# Patient Record
Sex: Female | Born: 1985 | Race: Black or African American | Hispanic: No | Marital: Single | Smoking: Never smoker
Health system: Southern US, Community
[De-identification: ages and names within clinical notes are randomized; demographics above are authoritative.]

## PROBLEM LIST (undated history)

## (undated) ENCOUNTER — Inpatient Hospital Stay (HOSPITAL_COMMUNITY): Payer: Self-pay

## (undated) ENCOUNTER — Emergency Department (HOSPITAL_COMMUNITY): Admission: EM | Payer: Medicaid Other | Source: Home / Self Care

## (undated) DIAGNOSIS — Z8619 Personal history of other infectious and parasitic diseases: Secondary | ICD-10-CM

## (undated) DIAGNOSIS — E119 Type 2 diabetes mellitus without complications: Secondary | ICD-10-CM

## (undated) DIAGNOSIS — D573 Sickle-cell trait: Secondary | ICD-10-CM

## (undated) DIAGNOSIS — I10 Essential (primary) hypertension: Secondary | ICD-10-CM

## (undated) HISTORY — DX: Sickle-cell trait: D57.3

## (undated) HISTORY — PX: NO PAST SURGERIES: SHX2092

## (undated) HISTORY — DX: Essential (primary) hypertension: I10

## (undated) HISTORY — DX: Personal history of other infectious and parasitic diseases: Z86.19

## (undated) HISTORY — DX: Type 2 diabetes mellitus without complications: E11.9

---
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
## 2005-02-28 ENCOUNTER — Emergency Department (HOSPITAL_COMMUNITY): Admission: EM | Admit: 2005-02-28 | Discharge: 2005-02-28 | Payer: Self-pay | Admitting: Family Medicine

## 2005-04-20 ENCOUNTER — Ambulatory Visit: Payer: Self-pay | Admitting: Family Medicine

## 2006-08-31 ENCOUNTER — Inpatient Hospital Stay (HOSPITAL_COMMUNITY): Admission: AD | Admit: 2006-08-31 | Discharge: 2006-09-02 | Payer: Self-pay | Admitting: Obstetrics

## 2007-01-20 ENCOUNTER — Ambulatory Visit: Payer: Self-pay | Admitting: Family Medicine

## 2007-01-20 ENCOUNTER — Telehealth: Payer: Self-pay | Admitting: *Deleted

## 2007-06-18 ENCOUNTER — Encounter: Admission: RE | Admit: 2007-06-18 | Discharge: 2007-06-18 | Payer: Self-pay | Admitting: Occupational Medicine

## 2008-01-13 ENCOUNTER — Emergency Department (HOSPITAL_COMMUNITY): Admission: EM | Admit: 2008-01-13 | Discharge: 2008-01-13 | Payer: Self-pay | Admitting: Family Medicine

## 2008-02-03 ENCOUNTER — Emergency Department (HOSPITAL_COMMUNITY): Admission: EM | Admit: 2008-02-03 | Discharge: 2008-02-03 | Payer: Self-pay | Admitting: Family Medicine

## 2008-03-26 ENCOUNTER — Emergency Department (HOSPITAL_COMMUNITY): Admission: EM | Admit: 2008-03-26 | Discharge: 2008-03-26 | Payer: Self-pay | Admitting: Family Medicine

## 2008-06-30 ENCOUNTER — Emergency Department (HOSPITAL_COMMUNITY): Admission: EM | Admit: 2008-06-30 | Discharge: 2008-06-30 | Payer: Self-pay | Admitting: Family Medicine

## 2008-07-19 ENCOUNTER — Emergency Department (HOSPITAL_COMMUNITY): Admission: EM | Admit: 2008-07-19 | Discharge: 2008-07-19 | Payer: Self-pay | Admitting: Family Medicine

## 2008-08-20 ENCOUNTER — Emergency Department (HOSPITAL_COMMUNITY): Admission: EM | Admit: 2008-08-20 | Discharge: 2008-08-20 | Payer: Self-pay | Admitting: Emergency Medicine

## 2008-08-23 ENCOUNTER — Emergency Department (HOSPITAL_COMMUNITY): Admission: EM | Admit: 2008-08-23 | Discharge: 2008-08-23 | Payer: Self-pay | Admitting: Emergency Medicine

## 2008-09-12 ENCOUNTER — Emergency Department (HOSPITAL_COMMUNITY): Admission: EM | Admit: 2008-09-12 | Discharge: 2008-09-12 | Payer: Self-pay | Admitting: Emergency Medicine

## 2009-01-08 ENCOUNTER — Emergency Department (HOSPITAL_COMMUNITY): Admission: EM | Admit: 2009-01-08 | Discharge: 2009-01-08 | Payer: Self-pay | Admitting: Family Medicine

## 2009-04-20 ENCOUNTER — Emergency Department (HOSPITAL_COMMUNITY): Admission: EM | Admit: 2009-04-20 | Discharge: 2009-04-20 | Payer: Self-pay | Admitting: Family Medicine

## 2009-10-21 ENCOUNTER — Emergency Department (HOSPITAL_COMMUNITY): Admission: EM | Admit: 2009-10-21 | Discharge: 2009-10-21 | Payer: Self-pay | Admitting: Emergency Medicine

## 2010-04-11 ENCOUNTER — Emergency Department (HOSPITAL_COMMUNITY): Admission: EM | Admit: 2010-04-11 | Discharge: 2010-04-11 | Payer: Self-pay | Admitting: Family Medicine

## 2010-07-05 ENCOUNTER — Emergency Department (HOSPITAL_COMMUNITY): Admission: EM | Admit: 2010-07-05 | Discharge: 2010-07-05 | Payer: Self-pay | Admitting: Family Medicine

## 2010-08-28 ENCOUNTER — Emergency Department (HOSPITAL_COMMUNITY)
Admission: EM | Admit: 2010-08-28 | Discharge: 2010-08-28 | Payer: Self-pay | Source: Home / Self Care | Admitting: Family Medicine

## 2010-12-10 ENCOUNTER — Emergency Department (HOSPITAL_COMMUNITY)
Admission: EM | Admit: 2010-12-10 | Discharge: 2010-12-10 | Disposition: A | Discharge status: Home / Self Care | Payer: No Typology Code available for payment source | Attending: Emergency Medicine | Admitting: Emergency Medicine

## 2010-12-10 DIAGNOSIS — M545 Low back pain, unspecified: Secondary | ICD-10-CM | POA: Insufficient documentation

## 2010-12-10 DIAGNOSIS — S335XXA Sprain of ligaments of lumbar spine, initial encounter: Secondary | ICD-10-CM | POA: Insufficient documentation

## 2010-12-10 DIAGNOSIS — Y929 Unspecified place or not applicable: Secondary | ICD-10-CM | POA: Insufficient documentation

## 2010-12-10 DIAGNOSIS — J45909 Unspecified asthma, uncomplicated: Secondary | ICD-10-CM | POA: Insufficient documentation

## 2010-12-10 DIAGNOSIS — D573 Sickle-cell trait: Secondary | ICD-10-CM | POA: Insufficient documentation

## 2010-12-17 LAB — URINALYSIS, ROUTINE W REFLEX MICROSCOPIC
Bilirubin Urine: NEGATIVE
Hgb urine dipstick: NEGATIVE
Nitrite: NEGATIVE
Specific Gravity, Urine: 1.014 (ref 1.005–1.030)
pH: 5.5 (ref 5.0–8.0)

## 2010-12-17 LAB — CBC
HCT: 36.9 % (ref 36.0–46.0)
Hemoglobin: 13 g/dL (ref 12.0–15.0)
RBC: 4.22 MIL/uL (ref 3.87–5.11)
WBC: 6.1 10*3/uL (ref 4.0–10.5)

## 2010-12-17 LAB — BASIC METABOLIC PANEL
BUN: 3 mg/dL — ABNORMAL LOW (ref 6–23)
CO2: 22 mEq/L (ref 19–32)
Calcium: 9 mg/dL (ref 8.4–10.5)
Chloride: 100 mEq/L (ref 96–112)
Creatinine, Ser: 0.77 mg/dL (ref 0.4–1.2)
GFR calc Af Amer: >60 mL/min (ref >60)
GFR calc non Af Amer: >60 mL/min (ref >60)
Glucose, Bld: 102 mg/dL — ABNORMAL HIGH (ref 70–99)
Potassium: 3.6 mEq/L (ref 3.5–5.1)
Sodium: 127 mEq/L — ABNORMAL LOW (ref 135–145)

## 2011-03-21 ENCOUNTER — Emergency Department (HOSPITAL_COMMUNITY)
Admission: EM | Admit: 2011-03-21 | Discharge: 2011-03-21 | Disposition: A | Discharge status: Home / Self Care | Payer: Medicaid Other | Attending: Emergency Medicine | Admitting: Emergency Medicine

## 2011-03-21 DIAGNOSIS — D573 Sickle-cell trait: Secondary | ICD-10-CM | POA: Insufficient documentation

## 2011-03-21 DIAGNOSIS — J3489 Other specified disorders of nose and nasal sinuses: Secondary | ICD-10-CM | POA: Insufficient documentation

## 2011-03-21 DIAGNOSIS — R059 Cough, unspecified: Secondary | ICD-10-CM | POA: Insufficient documentation

## 2011-03-21 DIAGNOSIS — J45901 Unspecified asthma with (acute) exacerbation: Secondary | ICD-10-CM | POA: Insufficient documentation

## 2011-03-21 DIAGNOSIS — R05 Cough: Secondary | ICD-10-CM | POA: Insufficient documentation

## 2011-07-03 LAB — CULTURE, ROUTINE-ABSCESS

## 2011-09-29 ENCOUNTER — Ambulatory Visit: Payer: Worker's Compensation

## 2012-02-27 ENCOUNTER — Encounter (HOSPITAL_COMMUNITY): Payer: Self-pay | Admitting: *Deleted

## 2012-02-27 ENCOUNTER — Emergency Department (HOSPITAL_COMMUNITY): Payer: Self-pay

## 2012-02-27 ENCOUNTER — Emergency Department (HOSPITAL_COMMUNITY)
Admission: EM | Admit: 2012-02-27 | Discharge: 2012-02-27 | Disposition: A | Discharge status: Home / Self Care | Payer: Self-pay | Attending: Emergency Medicine | Admitting: Emergency Medicine

## 2012-02-27 DIAGNOSIS — IMO0001 Reserved for inherently not codable concepts without codable children: Secondary | ICD-10-CM | POA: Insufficient documentation

## 2012-02-27 DIAGNOSIS — M25519 Pain in unspecified shoulder: Secondary | ICD-10-CM | POA: Insufficient documentation

## 2012-02-27 MED ORDER — NAPROXEN 500 MG PO TABS: 500.0000 mg | ORAL_TABLET | Freq: Two times a day (BID) | ORAL | Status: DC

## 2012-02-27 MED ORDER — KETOROLAC TROMETHAMINE 60 MG/2ML IM SOLN
60.0000 mg | Freq: Once | INTRAMUSCULAR | Status: AC
  Administered 2012-02-27: 60 mg via INTRAMUSCULAR
  Filled 2012-02-27: qty 2

## 2012-02-27 NOTE — ED Provider Notes (Signed)
History     CSN: [id number]  Arrival date & time 02/27/12  1111   First MD Initiated Contact with Patient 02/27/12 1138      Chief Complaint  Patient presents with  . Shoulder Pain    (Consider location/radiation/quality/duration/timing/severity/associated sxs/prior treatment) HPI Comments: Patient with a significant medical history presents emergency department chief complaint of left shoulder pain.  Acute onset of gradually worsening pain about a week long period patient states she's been working more than usual for multiple days and wrote doing a lot of over arms motions and lifting heavy frozen boxes.  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  Patient is right arm dominant.  Patient is a 26 y.o. female presenting with shoulder pain. The history is provided by the patient.  Shoulder Pain This is a new problem. The current episode started in the past 7 days. The problem occurs constantly. The problem has been gradually worsening. Associated symptoms include arthralgias and myalgias. Pertinent negatives include no abdominal pain, anorexia, change in bowel habit, chest pain, chills, congestion, coughing, diaphoresis, fatigue, fever, headaches, joint swelling, nausea, neck pain, numbness, rash, sore throat, swollen glands, urinary symptoms, vertigo, visual change, vomiting or weakness. The symptoms are aggravated by nothing. She has tried nothing for the symptoms.    History reviewed. No pertinent past medical history.  History reviewed. No pertinent past surgical history.  History reviewed. No pertinent family history.  History  Substance Use Topics  . Smoking status: Not on file  . Smokeless tobacco: Not on file  . Alcohol Use: Not on file    OB History    Grav Para Term Preterm Abortions TAB SAB Ect Mult Living                  Review of Systems  Constitutional: Negative for fever, chills, diaphoresis and fatigue.  HENT:  Negative for congestion, sore throat and neck pain.   Respiratory: Negative for cough.   Cardiovascular: Negative for chest pain.  Gastrointestinal: Negative for nausea, vomiting, abdominal pain, anorexia and change in bowel habit.  Musculoskeletal: Positive for myalgias and arthralgias. Negative for back pain, joint swelling and gait problem.  Skin: Negative for rash.  Neurological: Negative for vertigo, weakness, numbness and headaches.  All other systems reviewed and are negative.    Allergies  Review of patient's allergies indicates no known allergies.  Home Medications   Current Outpatient Rx  Name Route Sig Dispense Refill  . ALBUTEROL SULFATE HFA 108 (90 BASE) MCG/ACT IN AERS Inhalation Inhale 2 puffs into the lungs every 6 (six) hours as needed. For wheezing    . IBUPROFEN 200 MG PO TABS Oral Take 600 mg by mouth every 6 (six) hours as needed. For pain      BP 120/69  Pulse 78  Temp(Src) 98.3 F (36.8 C) (Oral)  Resp 14  SpO2 99%  LMP 02/14/2012  Physical Exam  Nursing note and vitals reviewed. Constitutional: She is oriented to person, place, and time. She appears well-developed and well-nourished. No distress.  HENT:  Head: Normocephalic and atraumatic.  Eyes: Conjunctivae and EOM are normal.  Neck: Normal range of motion.  Pulmonary/Chest: Effort normal.  Musculoskeletal: Normal range of motion.       Tenderness to palpation over left supraspinatus and infraspinatus muscles.  Painful ROM above 90 abduction, negative Neer sign, no pain with internal rotation.  Strength 5 out of 5 bilaterally, however induces pain.  Intact  distal pulses and sensation.  Neurological: She is alert and oriented to person, place, and time.  Skin: Skin is warm and dry. No rash noted. She is not diaphoretic.  Psychiatric: She has a normal mood and affect. Her behavior is normal.    ED Course  Procedures (including critical care time)  Labs Reviewed - No data to display Dg Shoulder  Left  02/27/2012  *RADIOLOGY REPORT*  Clinical Data: Left shoulder pain.  LEFT SHOULDER - 2+ VIEW  Comparison: None.  Findings: No fracture or dislocation. Small inferior acromial spur at the acromioclavicular joint.  Visualized portion of the left chest is unremarkable.  IMPRESSION: No acute findings.  Original Report Authenticated By: Reyes Ivan, M.D.     No diagnosis found.    MDM  Left shoulder pain ? Shoulder impingement syndrom vs rotator cuff tendinopathy  Images reviewed and discussed with patient.  Patient treated in the emergency department with Toradol.  She'll be discharged with instructions for cryotherapy, rest, and Naprosyn.  Patient will be given a work no with instructions for  restrictions.  Patient advised to followup with orthopedics if symptoms persist.  Recommended physical therapy and stretching as well. Patient verbalizes understanding.  Jaci Carrel, [place] 02/27/12 1340

## 2012-02-27 NOTE — Discharge Instructions (Signed)
Read instructions below to learn more about your possible diagnosis & for reasons to return to the ED. I recommend ice treatment and physical therapy.   Is there anything I can do on my own to feel better? -- Yes. Different exercises can help your shoulder get better. To keep your shoulder from getting too stiff, you can do an exercise called the pendulum stretch. To do this exercise, let your arm relax and hang down while you sit or stand. Move your arm back and forth, then side to side, and then around in small circles. Try to do this exercise for 5 minutes, 1 or 2 times a day.  Other types of exercises can help make the shoulder muscles stronger.  When you do shoulder exercises, it's important to: Warm up your shoulder first by taking a hot shower or bath.  Start slowly and make the exercises harder over time.  Know that some soreness is normal. If you have sharp or tearing pain, stop what you're doing and let your doctor or nurse know.

## 2012-02-27 NOTE — ED Notes (Signed)
Discharged home with written and verbal instruction.  Work note given.  No questions or concerns at discharge.

## 2012-02-27 NOTE — ED Notes (Signed)
To ED for eval of left shoulder pain and stiffness. Pt states she has had this pain since last week. No injury noted. 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 Pt able to move arm, but with pain. Good radial pulses

## 2012-03-03 NOTE — ED Provider Notes (Signed)
Medical screening examination/treatment/procedure(s) were performed by non-physician practitioner and as supervising physician I was immediately available for consultation/collaboration.  Cyndra Numbers, MD 03/03/12 [phone]

## 2012-03-04 ENCOUNTER — Emergency Department (HOSPITAL_COMMUNITY)
Admission: EM | Admit: 2012-03-04 | Discharge: 2012-03-04 | Disposition: A | Discharge status: Home / Self Care | Payer: Self-pay | Attending: Emergency Medicine | Admitting: Emergency Medicine

## 2012-03-04 ENCOUNTER — Encounter (HOSPITAL_COMMUNITY): Payer: Self-pay | Admitting: Emergency Medicine

## 2012-03-04 DIAGNOSIS — Z0289 Encounter for other administrative examinations: Secondary | ICD-10-CM | POA: Insufficient documentation

## 2012-03-04 DIAGNOSIS — M25519 Pain in unspecified shoulder: Secondary | ICD-10-CM

## 2012-03-04 DIAGNOSIS — J45909 Unspecified asthma, uncomplicated: Secondary | ICD-10-CM | POA: Insufficient documentation

## 2012-03-04 NOTE — ED Provider Notes (Signed)
History     CSN: [id number]  Arrival date & time 03/04/12  1256   First MD Initiated Contact with Patient 03/04/12 1338      Chief Complaint  Patient presents with  . Letter for School/Work    (Consider location/radiation/quality/duration/timing/severity/associated sxs/prior treatment) HPI Pt with recent visit for shoulder pain was given work note for light duty however her pain is resolved and she needs a note clearing her to return to normal work. Denies any other concerns.   Past Medical History  Diagnosis Date  . Asthma     History reviewed. No pertinent past surgical history.  No family history on file.  History  Substance Use Topics  . Smoking status: Never Smoker   . Smokeless tobacco: Not on file  . Alcohol Use: Yes    OB History    Grav Para Term Preterm Abortions TAB SAB Ect Mult Living                  Review of Systems  Musculoskeletal: Negative for myalgias and arthralgias.    Allergies  Review of patient's allergies indicates no known allergies.  Home Medications   Current Outpatient Rx  Name Route Sig Dispense Refill  . ALBUTEROL SULFATE HFA 108 (90 BASE) MCG/ACT IN AERS Inhalation Inhale 2 puffs into the lungs every 6 (six) hours as needed. For wheezing    . IBUPROFEN 200 MG PO TABS Oral Take 600 mg by mouth every 6 (six) hours as needed. For pain    . NAPROXEN 500 MG PO TABS Oral Take 500 mg by mouth 2 (two) times daily.      BP 125/73  Pulse 81  Temp(Src) 98.7 F (37.1 C) (Oral)  Resp 20  SpO2 100%  LMP 02/14/2012  Physical Exam  Constitutional: She is oriented to person, place, and time. She appears well-developed and well-nourished.  HENT:  Head: Normocephalic and atraumatic.  Neck: Neck supple.  Pulmonary/Chest: Effort normal.  Musculoskeletal: Normal range of motion. She exhibits no edema and no tenderness.  Neurological: She is alert and oriented to person, place, and time. No cranial nerve deficit.  Psychiatric: She has a  normal mood and affect. Her behavior is normal.    ED Course  Procedures (including critical care time)  Labs Reviewed - No data to display No results found.   No diagnosis found.    MDM  Return to Work note provided       Leonette Most B. Bernette Mayers, MD 03/04/12 1342

## 2012-03-04 NOTE — ED Notes (Signed)
Here April 30th for left shoulder pain. Given work note with restriction light duty for two weeks.  Needs to return to work earlier however employer needs a work noted that states she can.

## 2012-03-04 NOTE — ED Notes (Signed)
Pt d/c home in NAD. Pt voiced understanding of d/c instructions.  

## 2012-03-04 NOTE — Discharge Instructions (Signed)
Arthralgia Your caregiver has diagnosed you as suffering from an arthralgia. Arthralgia means there is pain in a joint. This can come from many reasons including:  Bruising the joint which causes soreness (inflammation) in the joint.   Wear and tear on the joints which occur as we grow older (osteoarthritis).   Overusing the joint.   Various forms of arthritis.   Infections of the joint.  Regardless of the cause of pain in your joint, most of these different pains respond to anti-inflammatory drugs and rest. The exception to this is when a joint is infected, and these cases are treated with antibiotics, if it is a bacterial infection. HOME CARE INSTRUCTIONS   Rest the injured area for as long as directed by your caregiver. Then slowly start using the joint as directed by your caregiver and as the pain allows. Crutches as directed may be useful if the ankles, knees or hips are involved. If the knee was splinted or casted, continue use and care as directed. If an stretchy or elastic wrapping bandage has been applied today, it should be removed and re-applied every 3 to 4 hours. It should not be applied tightly, but firmly enough to keep swelling down. Watch toes and feet for swelling, bluish discoloration, coldness, numbness or excessive pain. If any of these problems (symptoms) occur, remove the ace bandage and re-apply more loosely. If these symptoms persist, contact your caregiver or return to this location.   For the first 24 hours, keep the injured extremity elevated on pillows while lying down.   Apply ice for 15 to 20 minutes to the sore joint every couple hours while awake for the first half day. Then 3 to 4 times per day for the first 48 hours. Put the ice in a plastic bag and place a towel between the bag of ice and your skin.   Wear any splinting, casting, elastic bandage applications, or slings as instructed.   Only take over-the-counter or prescription medicines for pain,  discomfort, or fever as directed by your caregiver. Do not use aspirin immediately after the injury unless instructed by your physician. Aspirin can cause increased bleeding and bruising of the tissues.   If you were given crutches, continue to use them as instructed and do not resume weight bearing on the sore joint until instructed.  Persistent pain and inability to use the sore joint as directed for more than 2 to 3 days are warning signs indicating that you should see a caregiver for a follow-up visit as soon as possible. Initially, a hairline fracture (break in bone) may not be evident on X-rays. Persistent pain and swelling indicate that further evaluation, non-weight bearing or use of the joint (use of crutches or slings as instructed), or further X-rays are indicated. X-rays may sometimes not show a small fracture until a week or 10 days later. Make a follow-up appointment with your own caregiver or one to whom we have referred you. A radiologist (specialist in reading X-rays) may read your X-rays. Make sure you know how you are to obtain your X-ray results. Do not assume everything is normal if you do not hear from us. SEEK MEDICAL CARE IF: Bruising, swelling, or pain increases. SEEK IMMEDIATE MEDICAL CARE IF:   Your fingers or toes are numb or blue.   The pain is not responding to medications and continues to stay the same or get worse.   The pain in your joint becomes severe.   You develop a fever over   102 F (38.9 C).   It becomes impossible to move or use the joint.  MAKE SURE YOU:   Understand these instructions.   Will watch your condition.   Will get help right away if you are not doing well or get worse.  Document Released: 09/17/2005 Document Revised: 09/06/2011 Document Reviewed: 05/05/2008 ExitCare Patient Information 2012 [employer]. 

## 2013-10-01 NOTE — L&D Delivery Note (Signed)
Final Labor Progress Note [phone] pt reports an increased in rectal pressure.  VE C/C/+2.  FHR remained reassuring with occasional variable decelerations with quick recovery to baseline.     Vaginal Delivery Note The pt utilized an epidural as pain management.   Artificial rupture of membranes today, at 0648, clear.  GBS was positive, PCN x 4 doses were given.  Cervical dilation was complete at  1543.  NICHD Category 2.    Pushing with guidance began at  1543.   After 36 minutes of pushing the head, shoulders and the body of a viable female infant "Amari" delivered spontaneously with maternal effort in the LOA position at 1619   With vigorous tone and spontaneous cry, the infant was placed on moms abd.  After the umbilical cord was clamped it was cut by the FOB, then cord blood was obtained for evaluation.  Spontaneous delivery of a intact placenta with a 3 vessel cord via [facility] at  [facility].   Episiotomy: None   The vulva, perineum, vaginal vault, rectum and cervix were inspected, no repairs needed.  Postpartum pitocin as ordered.  Fundus firm, lochia minimum, bleeding under control. EBL 50, Pt hemodynamically stable.   Sponge, laps and needle count correct and verified with the primary care nurse.  Attending MD available at all times.    Mom and baby were left in stable condition, baby skin to skin. Routine postpartum orders   Mother unsure about method of contraception  Infant to have out patient circumcision   Placenta to pathology:  Cord Gases sent to lab: NO Cord blood sent to lab: YES   APGARS:  9 at 1 minute and 9 at 5 minutes Weight:. 8lb 10.4oz     Bhavika Schnider, CNM, MSN 08/25/2014. 4:48 PM

## 2013-12-21 ENCOUNTER — Inpatient Hospital Stay (HOSPITAL_COMMUNITY)
Admission: AD | Admit: 2013-12-21 | Discharge: 2013-12-21 | Disposition: A | Discharge status: Home / Self Care | Payer: Medicaid Other | Source: Ambulatory Visit | Attending: Obstetrics & Gynecology | Admitting: Obstetrics & Gynecology

## 2013-12-21 ENCOUNTER — Encounter (HOSPITAL_COMMUNITY): Payer: Self-pay | Admitting: *Deleted

## 2013-12-21 ENCOUNTER — Inpatient Hospital Stay (HOSPITAL_COMMUNITY): Payer: Medicaid Other

## 2013-12-21 DIAGNOSIS — O2390 Unspecified genitourinary tract infection in pregnancy, unspecified trimester: Secondary | ICD-10-CM | POA: Insufficient documentation

## 2013-12-21 DIAGNOSIS — B9689 Other specified bacterial agents as the cause of diseases classified elsewhere: Secondary | ICD-10-CM | POA: Insufficient documentation

## 2013-12-21 DIAGNOSIS — O26899 Other specified pregnancy related conditions, unspecified trimester: Secondary | ICD-10-CM

## 2013-12-21 DIAGNOSIS — N760 Acute vaginitis: Secondary | ICD-10-CM | POA: Insufficient documentation

## 2013-12-21 DIAGNOSIS — O208 Other hemorrhage in early pregnancy: Secondary | ICD-10-CM | POA: Insufficient documentation

## 2013-12-21 DIAGNOSIS — A499 Bacterial infection, unspecified: Secondary | ICD-10-CM | POA: Insufficient documentation

## 2013-12-21 DIAGNOSIS — R109 Unspecified abdominal pain: Secondary | ICD-10-CM | POA: Insufficient documentation

## 2013-12-21 DIAGNOSIS — J45909 Unspecified asthma, uncomplicated: Secondary | ICD-10-CM | POA: Insufficient documentation

## 2013-12-21 LAB — CBC
HCT: 36.4 % (ref 36.0–46.0)
HEMOGLOBIN: 12.6 g/dL (ref 12.0–15.0)
MCH: 28.8 pg (ref 26.0–34.0)
MCHC: 34.6 g/dL (ref 30.0–36.0)
MCV: 83.1 fL (ref 78.0–100.0)
Platelets: 216 10*3/uL (ref 150–400)
RBC: 4.38 MIL/uL (ref 3.87–5.11)
RDW: 14.3 % (ref 11.5–15.5)
WBC: 7 10*3/uL (ref 4.0–10.5)

## 2013-12-21 LAB — URINALYSIS, ROUTINE W REFLEX MICROSCOPIC
Bilirubin Urine: NEGATIVE
GLUCOSE, UA: NEGATIVE mg/dL
Hgb urine dipstick: NEGATIVE
Ketones, ur: NEGATIVE mg/dL
LEUKOCYTES UA: NEGATIVE
Nitrite: NEGATIVE
PROTEIN: NEGATIVE mg/dL
SPECIFIC GRAVITY, URINE: 1.015 (ref 1.005–1.030)
UROBILINOGEN UA: 0.2 mg/dL (ref 0.0–1.0)
pH: 6 (ref 5.0–8.0)

## 2013-12-21 LAB — HCG, QUANTITATIVE, PREGNANCY: hCG, Beta Chain, Quant, S: 3491 m[IU]/mL — ABNORMAL HIGH (ref <5)

## 2013-12-21 LAB — POCT PREGNANCY, URINE: PREG TEST UR: POSITIVE — AB

## 2013-12-21 LAB — WET PREP, GENITAL
Trich, Wet Prep: NONE SEEN
Yeast Wet Prep HPF POC: NONE SEEN

## 2013-12-21 LAB — ABO/RH: ABO/RH(D): A POS

## 2013-12-21 MED ORDER — METRONIDAZOLE 500 MG PO TABS: 500.0000 mg | ORAL_TABLET | Freq: Two times a day (BID) | ORAL | Status: DC

## 2013-12-21 MED ORDER — RHO D IMMUNE GLOBULIN 1500 UNIT/2ML IJ SOLN
300.0000 ug | Freq: Once | INTRAMUSCULAR | Status: DC
  Filled 2013-12-21: qty 2

## 2013-12-21 NOTE — Discharge Instructions (Signed)
Abdominal Pain During Pregnancy °Abdominal pain is common in pregnancy. Most of the time, it does not cause harm. There are many causes of abdominal pain. Some causes are more serious than others. Some of the causes of abdominal pain in pregnancy are easily diagnosed. Occasionally, the diagnosis takes time to understand. Other times, the cause is not determined. Abdominal pain can be a sign that something is very wrong with the pregnancy, or the pain may have nothing to do with the pregnancy at all. For this reason, always tell your health care provider if you have any abdominal discomfort. °HOME CARE INSTRUCTIONS  °Monitor your abdominal pain for any changes. The following actions may help to alleviate any discomfort you are experiencing: °· Do not have sexual intercourse or put anything in your vagina until your symptoms go away completely. °· Get plenty of rest until your pain improves. °· Drink clear fluids if you feel nauseous. Avoid solid food as long as you are uncomfortable or nauseous. °· Only take over-the-counter or prescription medicine as directed by your health care provider. °· Keep all follow-up appointments with your health care provider. °SEEK IMMEDIATE MEDICAL CARE IF: °· You are bleeding, leaking fluid, or passing tissue from the vagina. °· You have increasing pain or cramping. °· You have persistent vomiting. °· You have painful or bloody urination. °· You have a fever. °· You notice a decrease in your baby's movements. °· You have extreme weakness or feel faint. °· You have shortness of breath, with or without abdominal pain. °· You develop a severe headache with abdominal pain. °· You have abnormal vaginal discharge with abdominal pain. °· You have persistent diarrhea. °· You have abdominal pain that continues even after rest, or gets worse. °MAKE SURE YOU:  °· Understand these instructions. °· Will watch your condition. °· Will get help right away if you are not doing well or get  worse. °Document Released: 09/17/2005 Document Revised: 07/08/2013 Document Reviewed: 04/16/2013 °ExitCare® Patient Information ©2014 [employer]. ° °

## 2013-12-21 NOTE — MAU Provider Note (Signed)
History     CSN: [id number]  Arrival date and time: 12/21/13 6546   First Provider Initiated Contact with Patient 12/21/13 [phone]      Chief Complaint  Patient presents with  . Possible Pregnancy  . Abdominal Cramping   HPI  Ms. Erika Lewis is a 28 y.o. female [phone] at [redacted]w[redacted]d who presents for possible pregnancy; pt had a 2 positive pregnancy test at home 2 days ago. She took plan B 3 weeks ago; and started cramping this morning. The cramping is in her lower- mid abdomen and she currently rates her pain 4/10. She has also had some light spotting that is "not everyday".   OB History   Grav Para Term Preterm Abortions TAB SAB Ect Mult Living   5 1 1  2 2    1       Past Medical History  Diagnosis Date  . Asthma     No past surgical history on file.  Family History  Problem Relation Age of Onset  . Hypertension Mother   . Diabetes Mother   . Diabetes Maternal Grandmother   . Hypertension Maternal Grandmother     History  Substance Use Topics  . Smoking status: Never Smoker   . Smokeless tobacco: Not on file  . Alcohol Use: Yes    Allergies: No Known Allergies  Prescriptions prior to admission  Medication Sig Dispense Refill  . albuterol (PROVENTIL HFA;VENTOLIN HFA) 108 (90 BASE) MCG/ACT inhaler Inhale 2 puffs into the lungs every 6 (six) hours as needed. For wheezing       Results for orders placed during the hospital encounter of 12/21/13 (from the past 48 hour(s))  URINALYSIS, ROUTINE W REFLEX MICROSCOPIC     Status: None   Collection Time    12/21/13  7:50 AM      Result Value Ref Range   Color, Urine YELLOW  YELLOW   APPearance CLEAR  CLEAR   Specific Gravity, Urine 1.015  1.005 - 1.030   pH 6.0  5.0 - 8.0   Glucose, UA NEGATIVE  NEGATIVE mg/dL   Hgb urine dipstick NEGATIVE  NEGATIVE   Bilirubin Urine NEGATIVE  NEGATIVE   Ketones, ur NEGATIVE  NEGATIVE mg/dL   Protein, ur NEGATIVE  NEGATIVE mg/dL   Urobilinogen, UA 0.2  0.0 - 1.0 mg/dL   Nitrite  NEGATIVE  NEGATIVE   Leukocytes, UA NEGATIVE  NEGATIVE   Comment: MICROSCOPIC NOT DONE ON URINES WITH NEGATIVE PROTEIN, BLOOD, LEUKOCYTES, NITRITE, OR GLUCOSE <1000 mg/dL.  POCT PREGNANCY, URINE     Status: Abnormal   Collection Time    12/21/13  7:59 AM      Result Value Ref Range   Preg Test, Ur POSITIVE (*) NEGATIVE   Comment:            THE SENSITIVITY OF THIS     METHODOLOGY IS >24 mIU/mL  CBC     Status: None   Collection Time    12/21/13  8:55 AM      Result Value Ref Range   WBC 7.0  4.0 - 10.5 K/uL   RBC 4.38  3.87 - 5.11 MIL/uL   Hemoglobin 12.6  12.0 - 15.0 g/dL   HCT 36.4  36.0 - 46.0 %   MCV 83.1  78.0 - 100.0 fL   MCH 28.8  26.0 - 34.0 pg   MCHC 34.6  30.0 - 36.0 g/dL   RDW 14.3  11.5 - 15.5 %   Platelets 216  150 - 400 K/uL  ABO/RH     Status: None   Collection Time    12/21/13  8:55 AM      Result Value Ref Range   ABO/RH(D) A POS     Blood Bank Correction       Value: PREVIOUSLY REPORTED AS: ANEG RESULT MODIFIED ON: 12/21/2013 @ 1205 TO MATCAY, J. BY BOVELL.A. BLDTYPE  HCG, QUANTITATIVE, PREGNANCY     Status: Abnormal   Collection Time    12/21/13  8:55 AM      Result Value Ref Range   hCG, Beta Chain, Quant, S 3491 (*) <5 mIU/mL   Comment:              GEST. AGE      CONC.  (mIU/mL)       <=1 WEEK        5 - 50         2 WEEKS       50 - 500         3 WEEKS       100 - 10,000         4 WEEKS     1,000 - 30,000         5 WEEKS     3,500 - 115,000       6-8 WEEKS     12,000 - 270,000        12 WEEKS     15,000 - 220,000                FEMALE AND NON-PREGNANT FEMALE:         LESS THAN 5 mIU/mL  WET PREP, GENITAL     Status: Abnormal   Collection Time    12/21/13  9:22 AM      Result Value Ref Range   Yeast Wet Prep HPF POC NONE SEEN  NONE SEEN   Trich, Wet Prep NONE SEEN  NONE SEEN   Clue Cells Wet Prep HPF POC MODERATE (*) NONE SEEN   WBC, Wet Prep HPF POC FEW (*) NONE SEEN   Comment: MODERATE BACTERIA SEEN   US Ob Comp Less 14 Wks  12/21/2013    CLINICAL DATA:  Early pregnancy.  Spotting, pain.  EXAM: OBSTETRIC <14 WK ULTRASOUND  TECHNIQUE: Transabdominal ultrasound was performed for evaluation of the gestation as well as the maternal uterus and adnexal regions.  COMPARISON:  None.  FINDINGS: Intrauterine gestational sac: Visualized/normal in shape.  Yolk sac:  Visualized, indistinct  Embryo:  Not visualized  Cardiac Activity: Not visualized  Heart Rate:  bpm  MSD: 4.8  mm   5 w   0  d  CRL:     mm    w  d                  [place] EDC: 08/23/2014  Maternal uterus/adnexae: Small subchorionic hemorrhage. Ovaries are normal. Right corpus luteum cyst. Small anterior intramural fibroid measuring up to 1 cm.  IMPRESSION: Early intrauterine gestational sac with indistinct yolk sac. No fetal pole currently. Small subchorionic hemorrhage. Recommend followup ultrasound in 14 days to assure expected progression.   Electronically Signed   By: Rolm Baptise M.D.   On: 12/21/2013 11:30    Review of Systems  Constitutional: Negative for fever and chills.  Gastrointestinal: Positive for nausea and abdominal pain (+ Lower, suprapubic pain ). Negative for vomiting, diarrhea and constipation.  Genitourinary: Negative for dysuria, urgency, frequency and hematuria.  No vaginal discharge. + vaginal bleeding; spotting off and on. No dysuria.    Physical Exam   Blood pressure 123/73, pulse 100, temperature 98.3 F (36.8 C), temperature source Oral, resp. rate 18, height 5\' 3"  (1.6 m), weight 75.297 kg (166 lb), last menstrual period 11/20/2013.  Physical Exam  Constitutional: She is oriented to person, place, and time. She appears well-developed and well-nourished. No distress.  HENT:  Head: Normocephalic.  Eyes: Pupils are equal, round, and reactive to light.  Neck: Neck supple.  Respiratory: Effort normal.  GI: Soft. She exhibits no distension. There is no tenderness. There is no rebound and no guarding.  Genitourinary:  Speculum exam: Vagina - Small  amount of creamy discharge, no odor Cervix - No contact bleeding, no active bleeding Bimanual exam: Cervix closed Uterus non tender, normal size Adnexa non tender, no masses bilaterally GC/Chlam, wet prep done Chaperone present for exam.   Musculoskeletal: Normal range of motion.  Neurological: She is alert and oriented to person, place, and time.  Skin: Skin is warm. She is not diaphoretic.  Psychiatric: Her behavior is normal.    MAU Course  Procedures None  MDM Wet prep GC CBC ABO Quant [place] RH negative; rhogam workup initiated  Lab called the RN and informed the RN that the patients RH status was read incorrectly and that the patient is RH positive. I spoke to the lab myself; no rhogam given.   Assessment and Plan   A:  IUP with yolk sac; indistinct  Abdominal pain in pregnancy; cannot exclude ectopic pregnancy  Bacterial vaginosis  Small subchorionic hemorrhage   P:  Discharge home in stable condition  Ectopic precautions discussed at length Return to MAU if symptoms worsen  RX: Flagyl  Return to the clinic in 48 hours for repeat Quant due to indistinct yolk sac seen on [place]; may need repeat US   Jennifer Irene Rasch, NP  12/21/2013, 5:04 PM   Addendem:  Pt called back and states that she cannot make the 9:00 clinic appointment for repeat beta hcg due to her work schedule. I offered her to come back to MAU when she got out of work (1400) or so and the patient agreed that time would work better for her. Pt is coming to MAU 3/25 for follow up quant. Order placed  Darrelyn Hillock Rasch, NP 12/22/2013 2:24 PM

## 2013-12-22 ENCOUNTER — Telehealth: Payer: Self-pay | Admitting: Obstetrics and Gynecology

## 2013-12-22 LAB — GC/CHLAMYDIA PROBE AMP
CT Probe RNA: NEGATIVE
GC Probe RNA: NEGATIVE

## 2013-12-23 ENCOUNTER — Inpatient Hospital Stay (HOSPITAL_COMMUNITY)
Admission: AD | Admit: 2013-12-23 | Discharge: 2013-12-23 | Disposition: A | Discharge status: Home / Self Care | Payer: Medicaid Other | Source: Ambulatory Visit | Attending: Obstetrics & Gynecology | Admitting: Obstetrics & Gynecology

## 2013-12-23 DIAGNOSIS — O99891 Other specified diseases and conditions complicating pregnancy: Secondary | ICD-10-CM | POA: Insufficient documentation

## 2013-12-23 DIAGNOSIS — N949 Unspecified condition associated with female genital organs and menstrual cycle: Secondary | ICD-10-CM | POA: Insufficient documentation

## 2013-12-23 DIAGNOSIS — O26899 Other specified pregnancy related conditions, unspecified trimester: Secondary | ICD-10-CM

## 2013-12-23 DIAGNOSIS — R102 Pelvic and perineal pain: Secondary | ICD-10-CM

## 2013-12-23 DIAGNOSIS — O9989 Other specified diseases and conditions complicating pregnancy, childbirth and the puerperium: Principal | ICD-10-CM

## 2013-12-23 LAB — HCG, QUANTITATIVE, PREGNANCY: HCG, BETA CHAIN, QUANT, S: 9094 m[IU]/mL — AB (ref <5)

## 2013-12-23 NOTE — MAU Provider Note (Signed)
  History     CSN: [id number]  Arrival date and time: 12/23/13 1538   None     Chief Complaint  Patient presents with  . Follow-up   HPI  Erika Lewis is a 28 y.o. G5P1021 at [redacted]w[redacted]d who presents today for FU HCG. She was seen 2 days for bleeding in early pregnancy. [place] did not show a fetal pole, and indistinct yolk sac. She took Plan B 3 weeks ago. She denies any pain or bleeding at this time. She plans to keep the pregnancy, and plans to see Dr. Ruthann Lewis.   Past Medical History  Diagnosis Date  . Asthma     No past surgical history on file.  Family History  Problem Relation Age of Onset  . Hypertension Mother   . Diabetes Mother   . Diabetes Maternal Grandmother   . Hypertension Maternal Grandmother     History  Substance Use Topics  . Smoking status: Never Smoker   . Smokeless tobacco: Not on file  . Alcohol Use: Yes    Allergies: No Known Allergies  Prescriptions prior to admission  Medication Sig Dispense Refill  . albuterol (PROVENTIL HFA;VENTOLIN HFA) 108 (90 BASE) MCG/ACT inhaler Inhale 2 puffs into the lungs every 6 (six) hours as needed. For wheezing      . metroNIDAZOLE (FLAGYL) 500 MG tablet Take 1 tablet (500 mg total) by mouth 2 (two) times daily.  14 tablet  0    ROS Physical Exam   Blood pressure 133/69, temperature 98.8 F (37.1 C), temperature source Oral, resp. rate 18, last menstrual period 11/20/2013.  Physical Exam  Nursing note and vitals reviewed. Constitutional: She is oriented to person, place, and time. She appears well-developed and well-nourished. No distress.  GI: Soft. There is no tenderness.  Neurological: She is alert and oriented to person, place, and time.  Psychiatric: She has a normal mood and affect.    MAU Course  Procedures  Results for Erika, Lewis (MRN [id number]) as of 12/23/2013 17:19  Ref. Range 12/21/2013 08:55 12/21/2013 09:04 12/21/2013 09:22 12/21/2013 11:08 12/23/2013 16:18  hCG, Beta Chain, Quant, S  Latest Range: <5 mIU/mL 3491 (H)    9094 (H)    Assessment and Plan   1. Pelvic pain in pregnancy    Appropriate rise in HCG First trimester precautions reviewed Start [facility] as soon as possible Return to MAU as needed   Erika Lewis 12/23/2013, 5:19 PM

## 2013-12-23 NOTE — Discharge Instructions (Signed)
Pregnancy - First Trimester  During sexual intercourse, millions of sperm go into the vagina. Only 1 sperm will penetrate and fertilize the female egg while it is in the Fallopian tube. One week later, the fertilized egg implants into the wall of the uterus. An embryo begins to develop into a baby. At 6 to 8 weeks, the eyes and face are formed and the heartbeat can be seen on ultrasound. At the end of 12 weeks (first trimester), all the baby's organs are formed. Now that you are pregnant, you will want to do everything you can to have a healthy baby. Two of the most important things are to get good prenatal care and follow your caregiver's instructions. Prenatal care is all the medical care you receive before the baby's birth. It is given to prevent, find, and treat problems during the pregnancy and childbirth.  PRENATAL EXAMS  · During prenatal visits, your weight, blood pressure, and urine are checked. This is done to make sure you are healthy and progressing normally during the pregnancy.  · A pregnant woman should gain 25 to 35 pounds during the pregnancy. However, if you are overweight or underweight, your caregiver will advise you regarding your weight.  · Your caregiver will ask and answer questions for you.  · Blood work, cervical cultures, other necessary tests, and a Pap test are done during your prenatal exams. These tests are done to check on your health and the probable health of your baby. Tests are strongly recommended and done for HIV with your permission. This is the virus that causes AIDS. These tests are done because medicines can be given to help prevent your baby from being born with this infection should you have been infected without knowing it. Blood work is also used to find out your blood type, previous infections, and follow your blood levels (hemoglobin).  · Low hemoglobin (anemia) is common during pregnancy. Iron and vitamins are given to help prevent this. Later in the pregnancy, blood  tests for diabetes will be done along with any other tests if any problems develop.  · You may need other tests to make sure you and the baby are doing well.  CHANGES DURING THE FIRST TRIMESTER   Your body goes through many changes during pregnancy. They vary from person to person. Talk to your caregiver about changes you notice and are concerned about. Changes can include:  · Your menstrual period stops.  · The egg and sperm carry the genes that determine what you look like. Genes from you and your partner are forming a baby. The female genes determine whether the baby is a boy or a girl.  · Your body increases in girth and you may feel bloated.  · Feeling sick to your stomach (nauseous) and throwing up (vomiting). If the vomiting is uncontrollable, call your caregiver.  · Your breasts will begin to enlarge and become tender.  · Your nipples may stick out more and become darker.  · The need to urinate more. Painful urination may mean you have a bladder infection.  · Tiring easily.  · Loss of appetite.  · Cravings for certain kinds of food.  · At first, you may gain or lose a couple of pounds.  · You may have changes in your emotions from day to day (excited to be pregnant or concerned something may go wrong with the pregnancy and baby).  · You may have more vivid and strange dreams.  HOME CARE INSTRUCTIONS   ·   It is very important to avoid all smoking, alcohol and non-prescribed drugs during your pregnancy. These affect the formation and growth of the baby. Avoid chemicals while pregnant to ensure the delivery of a healthy infant.  · Start your prenatal visits by the 12th week of pregnancy. They are usually scheduled monthly at first, then more often in the last 2 months before delivery. Keep your caregiver's appointments. Follow your caregiver's instructions regarding medicine use, blood and lab tests, exercise, and diet.  · During pregnancy, you are providing food for you and your baby. Eat regular, well-balanced  meals. Choose foods such as meat, fish, milk and other low fat dairy products, vegetables, fruits, and whole-grain breads and cereals. Your caregiver will tell you of the ideal weight gain.  · You can help morning sickness by keeping soda crackers at the bedside. Eat a couple before arising in the morning. You may want to use the crackers without salt on them.  · Eating 4 to 5 small meals rather than 3 large meals a day also may help the nausea and vomiting.  · Drinking liquids between meals instead of during meals also seems to help nausea and vomiting.  · A physical sexual relationship may be continued throughout pregnancy if there are no other problems. Problems may be early (premature) leaking of amniotic fluid from the membranes, vaginal bleeding, or belly (abdominal) pain.  · Exercise regularly if there are no restrictions. Check with your caregiver or physical therapist if you are unsure of the safety of some of your exercises. Greater weight gain will occur in the last 2 trimesters of pregnancy. Exercising will help:  · Control your weight.  · Keep you in shape.  · Prepare you for labor and delivery.  · Help you lose your pregnancy weight after you deliver your baby.  · Wear a good support or jogging bra for breast tenderness during pregnancy. This may help if worn during sleep too.  · Ask when prenatal classes are available. Begin classes when they are offered.  · Do not use hot tubs, steam rooms, or saunas.  · Wear your seat belt when driving. This protects you and your baby if you are in an accident.  · Avoid raw meat, uncooked cheese, cat litter boxes, and soil used by cats throughout the pregnancy. These carry germs that can cause birth defects in the baby.  · The first trimester is a good time to visit your dentist for your dental health. Getting your teeth cleaned is okay. Use a softer toothbrush and brush gently during pregnancy.  · Ask for help if you have financial, counseling, or nutritional needs  during pregnancy. Your caregiver will be able to offer counseling for these needs as well as refer you for other special needs.  · Do not take any medicines or herbs unless told by your caregiver.  · Inform your caregiver if there is any mental or physical domestic violence.  · Make a list of emergency phone numbers of family, friends, hospital, and police and fire departments.  · Write down your questions. Take them to your prenatal visit.  · Do not douche.  · Do not cross your legs.  · If you have to stand for long periods of time, rotate you feet or take small steps in a circle.  · You may have more vaginal secretions that may require a sanitary pad. Do not use tampons or scented sanitary pads.  MEDICINES AND DRUG USE IN PREGNANCY  ·   Take prenatal vitamins as directed. The vitamin should contain 1 milligram of folic acid. Keep all vitamins out of reach of children. Only a couple vitamins or tablets containing iron may be fatal to a baby or young child when ingested.  · Avoid use of all medicines, including herbs, over-the-counter medicines, not prescribed or suggested by your caregiver. Only take over-the-counter or prescription medicines for pain, discomfort, or fever as directed by your caregiver. Do not use aspirin, ibuprofen, or naproxen unless directed by your caregiver.  · Let your caregiver also know about herbs you may be using.  · Alcohol is related to a number of birth defects. This includes fetal alcohol syndrome. All alcohol, in any form, should be avoided completely. Smoking will cause low birth rate and premature babies.  · Street or illegal drugs are very harmful to the baby. They are absolutely forbidden. A baby born to an addicted mother will be addicted at birth. The baby will go through the same withdrawal an adult does.  · Let your caregiver know about any medicines that you have to take and for what reason you take them.  SEEK MEDICAL CARE IF:   You have any concerns or worries during your  pregnancy. It is better to call with your questions if you feel they cannot wait, rather than worry about them.  SEEK IMMEDIATE MEDICAL CARE IF:   · An unexplained oral temperature above 102° F (38.9° C) develops, or as your caregiver suggests.  · You have leaking of fluid from the vagina (birth canal). If leaking membranes are suspected, take your temperature and inform your caregiver of this when you call.  · There is vaginal spotting or bleeding. Notify your caregiver of the amount and how many pads are used.  · You develop a bad smelling vaginal discharge with a change in the color.  · You continue to feel sick to your stomach (nauseated) and have no relief from remedies suggested. You vomit blood or coffee ground-like materials.  · You lose more than 2 pounds of weight in 1 week.  · You gain more than 2 pounds of weight in 1 week and you notice swelling of your face, hands, feet, or legs.  · You gain 5 pounds or more in 1 week (even if you do not have swelling of your hands, face, legs, or feet).  · You get exposed to German measles and have never had them.  · You are exposed to fifth disease or chickenpox.  · You develop belly (abdominal) pain. Round ligament discomfort is a common non-cancerous (benign) cause of abdominal pain in pregnancy. Your caregiver still must evaluate this.  · You develop headache, fever, diarrhea, pain with urination, or shortness of breath.  · You fall or are in a car accident or have any kind of trauma.  · There is mental or physical violence in your home.  Document Released: 09/11/2001 Document Revised: 06/11/2012 Document Reviewed: 03/15/2009  [employer]® Patient Information ©2014 [employer].

## 2013-12-23 NOTE — MAU Note (Signed)
Pt here for F/U BHCG.  Pt states she had pain yesterday, but none today.  Denies bleeding.

## 2013-12-24 NOTE — MAU Provider Note (Signed)
Attestation of Attending Supervision of Advanced Practitioner (CNM/NP): Evaluation and management procedures were performed by the Advanced Practitioner under my supervision and collaboration. I have reviewed the Advanced Practitioner's note and chart, and I agree with the management and plan.  [employer] H. 6:37 PM

## 2014-01-27 ENCOUNTER — Encounter (HOSPITAL_COMMUNITY): Payer: Self-pay | Admitting: Emergency Medicine

## 2014-01-27 ENCOUNTER — Emergency Department (HOSPITAL_COMMUNITY)
Admission: EM | Admit: 2014-01-27 | Discharge: 2014-01-27 | Disposition: A | Discharge status: Home / Self Care | Payer: Medicaid Other | Attending: Emergency Medicine | Admitting: Emergency Medicine

## 2014-01-27 DIAGNOSIS — K2990 Gastroduodenitis, unspecified, without bleeding: Secondary | ICD-10-CM

## 2014-01-27 DIAGNOSIS — K2970 Gastritis, unspecified, without bleeding: Secondary | ICD-10-CM

## 2014-01-27 DIAGNOSIS — J45909 Unspecified asthma, uncomplicated: Secondary | ICD-10-CM | POA: Insufficient documentation

## 2014-01-27 DIAGNOSIS — O9989 Other specified diseases and conditions complicating pregnancy, childbirth and the puerperium: Secondary | ICD-10-CM | POA: Insufficient documentation

## 2014-01-27 DIAGNOSIS — Z79899 Other long term (current) drug therapy: Secondary | ICD-10-CM | POA: Insufficient documentation

## 2014-01-27 LAB — URINALYSIS, ROUTINE W REFLEX MICROSCOPIC
Bilirubin Urine: NEGATIVE
Glucose, UA: NEGATIVE mg/dL
HGB URINE DIPSTICK: NEGATIVE
Ketones, ur: NEGATIVE mg/dL
LEUKOCYTES UA: NEGATIVE
Nitrite: NEGATIVE
PH: 6.5 (ref 5.0–8.0)
Protein, ur: NEGATIVE mg/dL
Specific Gravity, Urine: 1.01 (ref 1.005–1.030)
Urobilinogen, UA: 0.2 mg/dL (ref 0.0–1.0)

## 2014-01-27 LAB — COMPREHENSIVE METABOLIC PANEL WITH GFR
ALT: 9 U/L (ref 0–35)
AST: 12 U/L (ref 0–37)
Albumin: 3.4 g/dL — ABNORMAL LOW (ref 3.5–5.2)
Alkaline Phosphatase: 33 U/L — ABNORMAL LOW (ref 39–117)
BUN: 6 mg/dL (ref 6–23)
CO2: 25 meq/L (ref 19–32)
Calcium: 9.2 mg/dL (ref 8.4–10.5)
Chloride: 100 meq/L (ref 96–112)
Creatinine, Ser: 0.64 mg/dL (ref 0.50–1.10)
GFR calc Af Amer: >90 mL/min (ref >90)
GFR calc non Af Amer: >90 mL/min (ref >90)
Glucose, Bld: 81 mg/dL (ref 70–99)
Potassium: 3.5 meq/L — ABNORMAL LOW (ref 3.7–5.3)
Sodium: 137 meq/L (ref 137–147)
Total Bilirubin: <0.2 mg/dL — ABNORMAL LOW (ref 0.3–1.2)
Total Protein: 6.8 g/dL (ref 6.0–8.3)

## 2014-01-27 LAB — CBC WITH DIFFERENTIAL/PLATELET
Basophils Absolute: 0 10*3/uL (ref 0.0–0.1)
Basophils Relative: 0 % (ref 0–1)
Eosinophils Absolute: 0.1 10*3/uL (ref 0.0–0.7)
Eosinophils Relative: 1 % (ref 0–5)
HCT: 33.7 % — ABNORMAL LOW (ref 36.0–46.0)
HEMOGLOBIN: 11.8 g/dL — AB (ref 12.0–15.0)
LYMPHS ABS: 0.9 10*3/uL (ref 0.7–4.0)
Lymphocytes Relative: 9 % — ABNORMAL LOW (ref 12–46)
MCH: 28.5 pg (ref 26.0–34.0)
MCHC: 35 g/dL (ref 30.0–36.0)
MCV: 81.4 fL (ref 78.0–100.0)
MONOS PCT: 7 % (ref 3–12)
Monocytes Absolute: 0.8 10*3/uL (ref 0.1–1.0)
NEUTROS ABS: 8.9 10*3/uL — AB (ref 1.7–7.7)
NEUTROS PCT: 83 % — AB (ref 43–77)
Platelets: 216 10*3/uL (ref 150–400)
RBC: 4.14 MIL/uL (ref 3.87–5.11)
RDW: 13.9 % (ref 11.5–15.5)
WBC: 10.6 10*3/uL — ABNORMAL HIGH (ref 4.0–10.5)

## 2014-01-27 LAB — LIPASE, BLOOD: Lipase: 23 U/L (ref 11–59)

## 2014-01-27 MED ORDER — FAMOTIDINE 20 MG PO TABS
20.0000 mg | ORAL_TABLET | Freq: Once | ORAL | Status: AC
  Administered 2014-01-27: 20 mg via ORAL
  Filled 2014-01-27: qty 1

## 2014-01-27 MED ORDER — FAMOTIDINE 20 MG PO TABS: 20.0000 mg | ORAL_TABLET | Freq: Two times a day (BID) | ORAL | Status: DC

## 2014-01-27 NOTE — ED Notes (Signed)
Pt from home with c/o abdominal pain with eating x 1 week.  Pt reports being about [redacted] weeks pregnant, 4th pregnancy, and has an OBGYN appointment for this coming Monday.  Pt states her nausea comes and goes but no vomiting, last BM yesterday.  Denies vaginal bleeding or discharge.  Pt also has c/o cold like symptoms, non-productive cough and her head "feels like its about to explode."  Pt in NAD, A&O.

## 2014-01-27 NOTE — ED Notes (Signed)
Pt brought back to room; pt instructed to undress and place on gown; secretary aware and will let RN and/or NT know

## 2014-01-27 NOTE — Discharge Instructions (Signed)

## 2014-01-27 NOTE — ED Provider Notes (Signed)
CSN: [id number]     Arrival date & time 01/27/14  2376 History   First MD Initiated Contact with Patient 01/27/14 [phone]     Chief Complaint  Patient presents with  . Abdominal Pain  . URI     (Consider location/radiation/quality/duration/timing/severity/associated sxs/prior Treatment) HPI Comments: Patient who is currently [redacted] weeks pregnant presents with abdominal pain. She describes a burning pain in her epigastrium. It's only after eating. She has taken some TUMS with mild relief. She's had some nausea throughout her pregnancy but it's not any worse since this pain started. Epigastric pain despite going on for about a week. She denies any bloody emesis. She denies any lower abdominal pain. There's no vaginal bleeding or discharge. She sees her OB/GYN on Monday for the first time during this pregnancy. She denies any fevers or chills. She denies any past history of abdominal surgeries.  Also has some mild runny nose and nasal congestion.  Patient is a 28 y.o. female presenting with abdominal pain and URI.  Abdominal Pain Associated symptoms: nausea   Associated symptoms: no chest pain, no chills, no cough, no diarrhea, no fatigue, no fever, no hematuria, no shortness of breath and no vomiting   URI Presenting symptoms: no congestion, no cough, no fatigue, no fever and no rhinorrhea   Associated symptoms: no arthralgias, no headaches and no sneezing     Past Medical History  Diagnosis Date  . Asthma    History reviewed. No pertinent past surgical history. Family History  Problem Relation Age of Onset  . Hypertension Mother   . Diabetes Mother   . Diabetes Maternal Grandmother   . Hypertension Maternal Grandmother    History  Substance Use Topics  . Smoking status: Never Smoker   . Smokeless tobacco: Never Used  . Alcohol Use: No   OB History   Grav Para Term Preterm Abortions TAB SAB Ect Mult Living   5 1 1  2 2    1      Review of Systems  Constitutional: Negative for  fever, chills, diaphoresis and fatigue.  HENT: Negative for congestion, rhinorrhea and sneezing.   Eyes: Negative.   Respiratory: Negative for cough, chest tightness and shortness of breath.   Cardiovascular: Negative for chest pain and leg swelling.  Gastrointestinal: Positive for nausea and abdominal pain. Negative for vomiting, diarrhea and blood in stool.  Genitourinary: Negative for frequency, hematuria, flank pain and difficulty urinating.  Musculoskeletal: Negative for arthralgias and back pain.  Skin: Negative for rash.  Neurological: Negative for dizziness, speech difficulty, weakness, numbness and headaches.      Allergies  Review of patient's allergies indicates no known allergies.  Home Medications   Prior to Admission medications   Medication Sig Start Date End Date Taking? Authorizing Provider  albuterol (PROVENTIL HFA;VENTOLIN HFA) 108 (90 BASE) MCG/ACT inhaler Inhale 2 puffs into the lungs every 6 (six) hours as needed. For wheezing   Yes Historical Provider, MD  Calcium Carbonate Antacid (TUMS E-X PO) Take 1 tablet by mouth daily.   Yes Historical Provider, MD   BP 124/68  Pulse 98  Temp(Src) 98.4 F (36.9 C) (Oral)  Resp 17  SpO2 100%  LMP 11/20/2013 Physical Exam  Constitutional: She is oriented to person, place, and time. She appears well-developed and well-nourished.  HENT:  Head: Normocephalic and atraumatic.  Eyes: Pupils are equal, round, and reactive to light.  Neck: Normal range of motion. Neck supple.  Cardiovascular: Normal rate, regular rhythm and normal heart sounds.  Pulmonary/Chest: Effort normal and breath sounds normal. No respiratory distress. She has no wheezes. She has no rales. She exhibits no tenderness.  Abdominal: Soft. Bowel sounds are normal. There is no tenderness. There is no rebound and no guarding.  Musculoskeletal: Normal range of motion. She exhibits no edema.  Lymphadenopathy:    She has no cervical adenopathy.   Neurological: She is alert and oriented to person, place, and time.  Skin: Skin is warm and dry. No rash noted.  Psychiatric: She has a normal mood and affect.    ED Course  Procedures (including critical care time) Labs Review Labs Reviewed  CBC WITH DIFFERENTIAL - Abnormal; Notable for the following:    WBC 10.6 (*)    Hemoglobin 11.8 (*)    HCT 33.7 (*)    Neutrophils Relative % 83 (*)    Neutro Abs 8.9 (*)    Lymphocytes Relative 9 (*)    All other components within normal limits  COMPREHENSIVE METABOLIC PANEL - Abnormal; Notable for the following:    Potassium 3.5 (*)    Albumin 3.4 (*)    Alkaline Phosphatase 33 (*)    Total Bilirubin <0.2 (*)    All other components within normal limits  LIPASE, BLOOD  URINALYSIS, ROUTINE W REFLEX MICROSCOPIC    Imaging Review No results found.   EKG Interpretation None      MDM   Final diagnoses:  Gastritis    Patient has no abdominal tenderness on exam. She has no pain to be suggestive of cholecystitis. Her symptoms are mostly after she eats. I will give her prescription for Pepcid. She has a followup appointment with her Dr. Ruthann Cancer on Monday. This is her first obstetric appointment. If her pain continues, she may need ultrasound of her gallbladder. Currently I don't feel imaging is indicated as she is completely nontender on exam. She has no lower abdominal pain which would be more concerning for pregnancy complication. Her lungs are clear without suggestions of pneumonia.    Malvin Johns, MD 01/27/14 1139

## 2014-05-05 ENCOUNTER — Encounter (HOSPITAL_COMMUNITY): Payer: Self-pay | Admitting: *Deleted

## 2014-05-05 ENCOUNTER — Inpatient Hospital Stay (HOSPITAL_COMMUNITY)
Admission: AD | Admit: 2014-05-05 | Discharge: 2014-05-05 | Disposition: A | Discharge status: Home / Self Care | Payer: Medicaid Other | Source: Ambulatory Visit | Attending: Obstetrics | Admitting: Obstetrics

## 2014-05-05 DIAGNOSIS — R1012 Left upper quadrant pain: Secondary | ICD-10-CM | POA: Diagnosis present

## 2014-05-05 DIAGNOSIS — IMO0001 Reserved for inherently not codable concepts without codable children: Secondary | ICD-10-CM

## 2014-05-05 DIAGNOSIS — O99891 Other specified diseases and conditions complicating pregnancy: Secondary | ICD-10-CM | POA: Insufficient documentation

## 2014-05-05 DIAGNOSIS — M7918 Myalgia, other site: Secondary | ICD-10-CM

## 2014-05-05 DIAGNOSIS — O9989 Other specified diseases and conditions complicating pregnancy, childbirth and the puerperium: Principal | ICD-10-CM

## 2014-05-05 LAB — URINALYSIS, ROUTINE W REFLEX MICROSCOPIC
Bilirubin Urine: NEGATIVE
GLUCOSE, UA: NEGATIVE mg/dL
Ketones, ur: NEGATIVE mg/dL
Leukocytes, UA: NEGATIVE
NITRITE: NEGATIVE
PH: 6 (ref 5.0–8.0)
Protein, ur: NEGATIVE mg/dL
Urobilinogen, UA: 0.2 mg/dL (ref 0.0–1.0)

## 2014-05-05 LAB — URINE MICROSCOPIC-ADD ON

## 2014-05-05 MED ORDER — CYCLOBENZAPRINE HCL 10 MG PO TABS
10.0000 mg | ORAL_TABLET | ORAL | Status: AC
  Administered 2014-05-05: 10 mg via ORAL
  Filled 2014-05-05: qty 1

## 2014-05-05 NOTE — MAU Provider Note (Signed)
Chief Complaint:  Abdominal Pain   First Provider Initiated Contact with Patient 05/05/14 1947      HPI: Erika Lewis is a 28 y.o. G5P1021 at [redacted]w[redacted]d who presents to maternity admissions reporting LUQ pain x 3 days which is worse with movement of her arms and walking up stairs. 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  She reports good fetal movement, denies LOF, vaginal bleeding, vaginal itching/burning, urinary symptoms, h/a, dizziness, n/v, or fever/chills.     Past Medical History: Past Medical History  Diagnosis Date  . Asthma     Past obstetric history: OB History  Gravida Para Term Preterm AB SAB TAB Ectopic Multiple Living  5 1 1  2  2   1     # Outcome Date GA Lbr Len/2nd Weight Sex Delivery Anes PTL Lv  5 CUR           4 TRM 08/31/06 [redacted]w[redacted]d   M SVD     3 TAB           2 TAB           1 GRA               Past Surgical History: History reviewed. No pertinent past surgical history.  Family History: Family History  Problem Relation Age of Onset  . Hypertension Mother   . Diabetes Mother   . Diabetes Maternal Grandmother   . Hypertension Maternal Grandmother     Social History: History  Substance Use Topics  . Smoking status: Never Smoker   . Smokeless tobacco: Never Used  . Alcohol Use: No    Allergies: No Known Allergies  Meds:  No prescriptions prior to admission    ROS: Pertinent findings in history of present illness.  Physical Exam  Blood pressure 119/71, pulse 80, temperature 99.1 F (37.3 C), temperature source Oral, resp. rate 18, height 5\' 4"  (1.626 m), weight 87.544 kg (193 lb), last menstrual period 11/20/2013. GENERAL: Well-developed, well-nourished female in no acute distress.  HEENT: normocephalic HEART: normal rate RESP: normal effort ABDOMEN: Soft, mild tenderness in left upper quadrant, significantly above fundus, gravid appropriate for  gestational age EXTREMITIES: Nontender, no edema NEURO: alert and oriented    FHT:  Baseline 145 , moderate variability, accelerations present, initially 2 variables when monitor first applied lasting 20 sec down to 120.  Category I tracing with accels following these short episodes.   Contractions: None on toco or to palpation   Labs:  Results for orders placed during the hospital encounter of 05/05/14 (from the past 48 hour(s))  URINALYSIS, ROUTINE W REFLEX MICROSCOPIC     Status: Abnormal   Collection Time    05/05/14  6:30 PM      Result Value Ref Range   Color, Urine STRAW (*) YELLOW   APPearance CLEAR  CLEAR   Specific Gravity, Urine <1.005 (*) 1.005 - 1.030   pH 6.0  5.0 - 8.0   Glucose, UA NEGATIVE  NEGATIVE mg/dL   Hgb urine dipstick TRACE (*) NEGATIVE   Bilirubin Urine NEGATIVE  NEGATIVE   Ketones, ur NEGATIVE  NEGATIVE mg/dL   Protein, ur NEGATIVE  NEGATIVE mg/dL   Urobilinogen, UA 0.2  0.0 - 1.0 mg/dL   Nitrite NEGATIVE  NEGATIVE   Leukocytes, UA NEGATIVE  NEGATIVE  URINE MICROSCOPIC-ADD ON     Status: Abnormal  Collection Time    05/05/14  6:30 PM      Result Value Ref Range   Squamous Epithelial / LPF RARE  RARE   WBC, UA 0-2  <3 WBC/hpf   RBC / HPF 0-2  <3 RBC/hpf   Bacteria, UA FEW (*) RARE   Assessment: 1. Musculoskeletal pain     Plan: Discharge home Labor precautions and fetal kick counts Flexeril 5-10 mg PO TID PRN Return to MAU as needed for emergencies      Follow-up Information   Follow up with Frederico Hamman, MD. (As scheduled)    Specialty:  Obstetrics and Gynecology   Contact information:   [place] [address] [phone]        Medication List         albuterol 108 (90 BASE) MCG/ACT inhaler  Commonly known as:  PROVENTIL HFA;VENTOLIN HFA  Inhale 2 puffs into the lungs every 6 (six) hours as needed for wheezing or shortness of breath.     calcium carbonate 500 MG chewable tablet   Commonly known as:  TUMS - dosed in mg elemental calcium  Chew 2 tablets by mouth daily as needed for indigestion or heartburn.     ferrous sulfate 325 (65 FE) MG tablet  Take 325 mg by mouth daily.     prenatal multivitamin Tabs tablet  Take 1 tablet by mouth daily.        Erika Lewis Certified Nurse-Midwife 05/07/2014 8:32 AM

## 2014-05-05 NOTE — Discharge Instructions (Signed)

## 2014-05-05 NOTE — MAU Note (Signed)
Pt reports she started having a sharp pain in her ULQ that hurts when she does any lind of activy. Good fetal movement reproted. Denies vag dischrge or bleeding.

## 2014-05-07 ENCOUNTER — Other Ambulatory Visit: Payer: Self-pay | Admitting: Advanced Practice Midwife

## 2014-05-07 MED ORDER — CYCLOBENZAPRINE HCL 10 MG PO TABS: 5.0000 mg | ORAL_TABLET | Freq: Three times a day (TID) | ORAL | Status: DC | PRN

## 2014-05-20 LAB — OB RESULTS CONSOLE HIV ANTIBODY (ROUTINE TESTING): HIV: NONREACTIVE

## 2014-05-20 LAB — OB RESULTS CONSOLE RUBELLA ANTIBODY, IGM: RUBELLA: IMMUNE

## 2014-05-20 LAB — OB RESULTS CONSOLE GC/CHLAMYDIA
Chlamydia: NEGATIVE
Gonorrhea: NEGATIVE

## 2014-05-20 LAB — OB RESULTS CONSOLE ANTIBODY SCREEN: ANTIBODY SCREEN: NEGATIVE

## 2014-05-20 LAB — OB RESULTS CONSOLE ABO/RH: RH TYPE: POSITIVE

## 2014-05-20 LAB — OB RESULTS CONSOLE RPR: RPR: NONREACTIVE

## 2014-05-20 LAB — OB RESULTS CONSOLE HEPATITIS B SURFACE ANTIGEN: HEP B S AG: NEGATIVE

## 2014-07-27 LAB — OB RESULTS CONSOLE GBS: GBS: POSITIVE

## 2014-07-28 ENCOUNTER — Encounter: Payer: Self-pay | Admitting: *Deleted

## 2014-08-02 ENCOUNTER — Encounter (HOSPITAL_COMMUNITY): Payer: Self-pay | Admitting: *Deleted

## 2014-08-23 ENCOUNTER — Telehealth (HOSPITAL_COMMUNITY): Payer: Self-pay | Admitting: *Deleted

## 2014-08-23 ENCOUNTER — Encounter (HOSPITAL_COMMUNITY): Payer: Self-pay | Admitting: *Deleted

## 2014-08-23 NOTE — Telephone Encounter (Signed)
Preadmission screen  

## 2014-08-24 ENCOUNTER — Inpatient Hospital Stay (HOSPITAL_COMMUNITY)
Admission: RE | Admit: 2014-08-24 | Discharge: 2014-08-27 | DRG: 775 | Disposition: A | Discharge status: Home / Self Care | Payer: Medicaid Other | Source: Ambulatory Visit | Attending: Obstetrics and Gynecology | Admitting: Obstetrics and Gynecology

## 2014-08-24 ENCOUNTER — Encounter (HOSPITAL_COMMUNITY): Payer: Self-pay

## 2014-08-24 DIAGNOSIS — Z3483 Encounter for supervision of other normal pregnancy, third trimester: Secondary | ICD-10-CM | POA: Diagnosis present

## 2014-08-24 DIAGNOSIS — Z3A41 41 weeks gestation of pregnancy: Secondary | ICD-10-CM | POA: Diagnosis present

## 2014-08-24 DIAGNOSIS — J45909 Unspecified asthma, uncomplicated: Secondary | ICD-10-CM | POA: Diagnosis present

## 2014-08-24 DIAGNOSIS — O99824 Streptococcus B carrier state complicating childbirth: Secondary | ICD-10-CM | POA: Diagnosis present

## 2014-08-24 DIAGNOSIS — O480 Post-term pregnancy: Secondary | ICD-10-CM | POA: Diagnosis present

## 2014-08-24 DIAGNOSIS — D259 Leiomyoma of uterus, unspecified: Secondary | ICD-10-CM | POA: Diagnosis present

## 2014-08-24 DIAGNOSIS — D573 Sickle-cell trait: Secondary | ICD-10-CM | POA: Diagnosis present

## 2014-08-24 LAB — CBC
HCT: 33.8 % — ABNORMAL LOW (ref 36.0–46.0)
HEMOGLOBIN: 11.8 g/dL — AB (ref 12.0–15.0)
MCH: 28.2 pg (ref 26.0–34.0)
MCHC: 34.9 g/dL (ref 30.0–36.0)
MCV: 80.7 fL (ref 78.0–100.0)
Platelets: 154 10*3/uL (ref 150–400)
RBC: 4.19 MIL/uL (ref 3.87–5.11)
RDW: 16.6 % — ABNORMAL HIGH (ref 11.5–15.5)
WBC: 10.9 10*3/uL — ABNORMAL HIGH (ref 4.0–10.5)

## 2014-08-24 LAB — TYPE AND SCREEN
ABO/RH(D): A POS
Antibody Screen: NEGATIVE

## 2014-08-24 MED ORDER — LACTATED RINGERS IV SOLN
500.0000 mL | INTRAVENOUS | Status: DC | PRN
  Administered 2014-08-25: 500 mL via INTRAVENOUS

## 2014-08-24 MED ORDER — ONDANSETRON HCL 4 MG/2ML IJ SOLN: 4.0000 mg | Freq: Four times a day (QID) | INTRAMUSCULAR | Status: DC | PRN

## 2014-08-24 MED ORDER — MISOPROSTOL 25 MCG QUARTER TABLET
25.0000 ug | ORAL_TABLET | ORAL | Status: DC | PRN
  Administered 2014-08-24: 25 ug via VAGINAL
  Filled 2014-08-24: qty 0.25
  Filled 2014-08-24: qty 1

## 2014-08-24 MED ORDER — TERBUTALINE SULFATE 1 MG/ML IJ SOLN
0.2500 mg | Freq: Once | INTRAMUSCULAR | Status: AC | PRN
Start: 2014-08-24 — End: 2014-08-24

## 2014-08-24 MED ORDER — OXYCODONE-ACETAMINOPHEN 5-325 MG PO TABS: 1.0000 | ORAL_TABLET | ORAL | Status: DC | PRN

## 2014-08-24 MED ORDER — CITRIC ACID-SODIUM CITRATE 334-500 MG/5ML PO SOLN: 30.0000 mL | ORAL | Status: DC | PRN

## 2014-08-24 MED ORDER — LIDOCAINE HCL (PF) 1 % IJ SOLN
30.0000 mL | INTRAMUSCULAR | Status: DC | PRN
  Filled 2014-08-24: qty 30

## 2014-08-24 MED ORDER — PENICILLIN G POTASSIUM 5000000 UNITS IJ SOLR: 5.0000 10*6.[IU] | Freq: Once | INTRAVENOUS | Status: DC

## 2014-08-24 MED ORDER — OXYTOCIN BOLUS FROM INFUSION: 500.0000 mL | INTRAVENOUS | Status: DC

## 2014-08-24 MED ORDER — PENICILLIN G POTASSIUM 5000000 UNITS IJ SOLR: 2.5000 10*6.[IU] | INTRAVENOUS | Status: DC

## 2014-08-24 MED ORDER — NALBUPHINE HCL 10 MG/ML IJ SOLN
10.0000 mg | INTRAMUSCULAR | Status: DC | PRN
  Administered 2014-08-24: 10 mg via INTRAVENOUS
  Filled 2014-08-24: qty 1

## 2014-08-24 MED ORDER — ACETAMINOPHEN 325 MG PO TABS: 650.0000 mg | ORAL_TABLET | ORAL | Status: DC | PRN

## 2014-08-24 MED ORDER — OXYTOCIN 40 UNITS IN LACTATED RINGERS INFUSION - SIMPLE MED
62.5000 mL/h | INTRAVENOUS | Status: DC
  Administered 2014-08-25: 999 mL/h via INTRAVENOUS
  Filled 2014-08-24: qty 1000

## 2014-08-24 MED ORDER — LACTATED RINGERS IV SOLN
INTRAVENOUS | Status: DC
Start: 2014-08-24 — End: 2014-08-25
  Administered 2014-08-24 – 2014-08-25 (×5): via INTRAVENOUS

## 2014-08-24 MED ORDER — OXYCODONE-ACETAMINOPHEN 5-325 MG PO TABS
2.0000 | ORAL_TABLET | ORAL | Status: DC | PRN
Start: 2014-08-24 — End: 2014-08-25

## 2014-08-24 NOTE — H&P (Signed)
Erika Lewis is a 28 y.o. female, G4P1021 at 40.6 weeks, presenting for IOL for post date pregnancy.  Per [facility] chart, patient desires WB.  GBS positive and with [place] trait, last Chem 9 completed and negative on 08/23/2014.  Patient with h/o Asthma.  Patient Active Problem List   Diagnosis Date Noted  . Post-dates pregnancy 08/24/2014    History of present pregnancy: Patient entered care at 5 weeks and transferred to [facility] at 24.2wks for waterbirth option.   EDC of 08/19/2014 was established by 16.1wk [place] on 03/05/2014.   Anatomy scan:  16.2 weeks, with limited findings and an fundal placenta.   Additional [place] evaluations:   -28.1wks: F/U Anatomy-Complete and appears normal. EFW 1355g, AFI 16.23cm, cervix 4.58cm. Significant prenatal events:  Prior to pregnancy patient took Plan B.  2nd Trimester: Pt c/o increased headaches, no relief with Tylenol. Last evaluation:  08/23/2014 by Dr. Octavio Manns, FHR: 146, VE: 1/0/-3, BP 106/60, Wt 214lbs  OB History    Gravida Para Term Preterm AB TAB SAB Ectopic Multiple Living   5 1 1  2 2    1      Past Medical History  Diagnosis Date  . Asthma   . Sickle cell trait   . Hx of varicella    No past surgical history on file. Family History: family history includes Diabetes in her maternal grandmother and mother; Hypertension in her maternal grandmother and mother; Sickle cell trait in her maternal aunt, maternal grandmother, mother, sister, and son. Social History:  reports that she has never smoked. She has never used smokeless tobacco. She reports that she does not drink alcohol or use illicit drugs.   Prenatal Transfer Tool  Maternal Diabetes: No Genetic Screening: Unknown Maternal Ultrasounds/Referrals: Normal Fetal Ultrasounds or other Referrals:  None Maternal Substance Abuse:  No Significant Maternal Medications:  Meds include: Other: Albuterol Sulfate, Iron, PNV, Tums Significant Maternal Lab Results: Lab values include: Group B Strep  positive    ROS:  No complaints at last visit.  +FM, some contractions  No Known Allergies     Last menstrual period 11/20/2013.   FHR: 145 bpm, Mod Var, -Decels, +Accels UCs:  None graphed or palpated  Prenatal labs: ABO, Rh: A/Positive/-- (08/20 0000) Antibody: Negative (08/20 0000) Rubella:    Immune RPR: Nonreactive (08/20 0000)  HBsAg: Negative (08/20 0000)  HIV: Non-reactive (08/20 0000)  GBS: Positive (10/27 0000) Sickle cell/Hgb electrophoresis:  Abnormal--AS Pap:  Abnormal--LGSIL GC:  Negative Chlamydia:  Negative Genetic screenings:  Unknown Glucola:  Abnormal 1 hr, Normal 3hr Other:  None    Assessment IUP at 40.6wks Cat I FT Post Dates Pregnancy GBS Positive  Plan: Admit to [facility] per consult with Dr. Scotty Court Routine Labor and Delivery Orders per [facility] Protocol Induction/Augmenation Orders per [facility] Protocol Will assess patient and start induction  Patient desires WB, will discuss options in regards to induction process  Mellony Danziger [facility], MSN 08/24/2014, 7:31 PM

## 2014-08-24 NOTE — Progress Notes (Signed)
EDIT RICCIARDELLI [id number]  Subjective -Patient expresses concerns regarding inability to have waterbirth.  Requests membrane stripping as induction method. Reports mild contractions and denies LOF and VB.  Erenest Rasher, at bedside.  Objective Filed Vitals:   08/24/14 1947  BP: 129/79  Pulse: 118  Resp: 16        Physical Exam  Constitutional: She is oriented to person, place, and time and well-developed, well-nourished, and in no distress. No distress.  Cardiovascular: Normal rate, regular rhythm and normal heart sounds.   Pulmonary/Chest: Effort normal and breath sounds normal.  Abdominal: Soft. Bowel sounds are normal.  Appears gravid--fundal height AGA, Soft, NT   Genitourinary: Uterus is enlarged. Cervix exhibits no motion tenderness. Thick  creamy  odorless  white and vaginal discharge found.  Musculoskeletal: Normal range of motion.  Neurological: She is alert and oriented to person, place, and time.  Skin: Skin is warm and dry.    SUO:RVIFBPPH: Closed Effacement (%): 0 Cervical Position: Posterior Station: -3 Presentation: Vertex Exam by:: Gavin Pound, CNM  Pelvis: -Proven:Yes to 7lbs -Adequate: Yes Leopolds: -Position:Vertex -EFW:~8 1/2lbs to 9 1/2lbs  FHR: 145 bpm, Mod Var, -Decels, +Accels UC:  None graphed or palpated  Induction/Augmentation Agent: Pitocin: None Cytotec: 1st Dose at 2030 Membranes: Intact  Assessment IUP at 40.6 wks Cat 1 FT Bishop Score: 3 Cervical Ripening  Plan -Discussed possibility of periods without pitocin infusion as an attempt to use water as potential coping method and delivery.  Informed patient that this would have to be further discussed with provider initiating pitocin.  -Discussed r/b of induction including fetal distress, serial induction, pain, and increased risk of c/s delivery -Discussed induction methods including cervical ripening agents, foley bulbs, and pitocin -Questions and concerns addressed -Continue  other mgmt as ordered -Will reassess in ~4hrs or prn  Edder Bellanca, Sharon, CNM 08/24/2014, 9:11 PM

## 2014-08-24 NOTE — Plan of Care (Signed)
Problem: Phase I Progression Outcomes Goal: OOB as tolerated unless otherwise ordered Outcome: Completed/Met Date Met:  08/24/14 Goal: Medications/IV Fluids N/A Outcome: Completed/Met Date Met:  08/24/14

## 2014-08-25 ENCOUNTER — Inpatient Hospital Stay (HOSPITAL_COMMUNITY): Payer: Medicaid Other | Admitting: Anesthesiology

## 2014-08-25 ENCOUNTER — Inpatient Hospital Stay (HOSPITAL_COMMUNITY): Admission: AD | Admit: 2014-08-25 | Payer: Medicaid Other | Source: Ambulatory Visit | Admitting: Obstetrics

## 2014-08-25 ENCOUNTER — Encounter (HOSPITAL_COMMUNITY): Payer: Self-pay

## 2014-08-25 DIAGNOSIS — J45909 Unspecified asthma, uncomplicated: Secondary | ICD-10-CM | POA: Diagnosis present

## 2014-08-25 DIAGNOSIS — D259 Leiomyoma of uterus, unspecified: Secondary | ICD-10-CM | POA: Diagnosis present

## 2014-08-25 DIAGNOSIS — D573 Sickle-cell trait: Secondary | ICD-10-CM | POA: Diagnosis present

## 2014-08-25 HISTORY — DX: Unspecified asthma, uncomplicated: J45.909

## 2014-08-25 HISTORY — DX: Leiomyoma of uterus, unspecified: D25.9

## 2014-08-25 LAB — RPR

## 2014-08-25 LAB — COMPREHENSIVE METABOLIC PANEL
ALK PHOS: 108 U/L (ref 39–117)
ALT: 10 U/L (ref 0–35)
AST: 18 U/L (ref 0–37)
Albumin: 2.4 g/dL — ABNORMAL LOW (ref 3.5–5.2)
Anion gap: 15 (ref 5–15)
BUN: 8 mg/dL (ref 6–23)
CO2: 19 meq/L (ref 19–32)
Calcium: 8.6 mg/dL (ref 8.4–10.5)
Chloride: 101 mEq/L (ref 96–112)
Creatinine, Ser: 0.88 mg/dL (ref 0.50–1.10)
GFR calc Af Amer: >90 mL/min (ref >90)
GFR, EST NON AFRICAN AMERICAN: 89 mL/min — AB (ref >90)
Glucose, Bld: 210 mg/dL — ABNORMAL HIGH (ref 70–99)
POTASSIUM: 4 meq/L (ref 3.7–5.3)
Sodium: 135 mEq/L — ABNORMAL LOW (ref 137–147)
Total Bilirubin: <0.2 mg/dL — ABNORMAL LOW (ref 0.3–1.2)
Total Protein: 5.4 g/dL — ABNORMAL LOW (ref 6.0–8.3)

## 2014-08-25 LAB — CBC WITH DIFFERENTIAL/PLATELET
Basophils Absolute: 0 10*3/uL (ref 0.0–0.1)
Basophils Relative: 0 % (ref 0–1)
Eosinophils Absolute: 0 10*3/uL (ref 0.0–0.7)
Eosinophils Relative: 0 % (ref 0–5)
HEMATOCRIT: 35.6 % — AB (ref 36.0–46.0)
Hemoglobin: 12.2 g/dL (ref 12.0–15.0)
LYMPHS PCT: 3 % — AB (ref 12–46)
Lymphs Abs: 0.5 10*3/uL — ABNORMAL LOW (ref 0.7–4.0)
MCH: 27.8 pg (ref 26.0–34.0)
MCHC: 34.3 g/dL (ref 30.0–36.0)
MCV: 81.1 fL (ref 78.0–100.0)
MONO ABS: 1 10*3/uL (ref 0.1–1.0)
Monocytes Relative: 5 % (ref 3–12)
NEUTROS ABS: 19.9 10*3/uL — AB (ref 1.7–7.7)
NEUTROS PCT: 93 % — AB (ref 43–77)
Platelets: 138 10*3/uL — ABNORMAL LOW (ref 150–400)
RBC: 4.39 MIL/uL (ref 3.87–5.11)
RDW: 16.6 % — AB (ref 11.5–15.5)
WBC: 21.4 10*3/uL — AB (ref 4.0–10.5)

## 2014-08-25 LAB — HIV ANTIBODY (ROUTINE TESTING W REFLEX): HIV: NONREACTIVE

## 2014-08-25 LAB — URIC ACID: Uric Acid, Serum: 5.5 mg/dL (ref 2.4–7.0)

## 2014-08-25 LAB — LACTATE DEHYDROGENASE: LDH: 233 U/L (ref 94–250)

## 2014-08-25 MED ORDER — PHENYLEPHRINE 40 MCG/ML (10ML) SYRINGE FOR IV PUSH (FOR BLOOD PRESSURE SUPPORT)
80.0000 ug | PREFILLED_SYRINGE | INTRAVENOUS | Status: DC | PRN
  Filled 2014-08-25: qty 2

## 2014-08-25 MED ORDER — LABETALOL HCL 100 MG PO TABS
100.0000 mg | ORAL_TABLET | Freq: Two times a day (BID) | ORAL | Status: DC
  Administered 2014-08-25: 100 mg via ORAL
  Filled 2014-08-25 (×2): qty 1

## 2014-08-25 MED ORDER — SENNOSIDES-DOCUSATE SODIUM 8.6-50 MG PO TABS
2.0000 | ORAL_TABLET | ORAL | Status: DC
  Administered 2014-08-26 – 2014-08-27 (×2): 2 via ORAL
  Filled 2014-08-25 (×3): qty 2

## 2014-08-25 MED ORDER — IBUPROFEN 600 MG PO TABS
600.0000 mg | ORAL_TABLET | Freq: Four times a day (QID) | ORAL | Status: DC
  Administered 2014-08-25 – 2014-08-27 (×5): 600 mg via ORAL
  Filled 2014-08-25 (×6): qty 1

## 2014-08-25 MED ORDER — DIPHENHYDRAMINE HCL 25 MG PO CAPS: 25.0000 mg | ORAL_CAPSULE | Freq: Four times a day (QID) | ORAL | Status: DC | PRN

## 2014-08-25 MED ORDER — LIDOCAINE HCL (PF) 1 % IJ SOLN
INTRAMUSCULAR | Status: DC | PRN
  Administered 2014-08-25 (×4): 4 mL

## 2014-08-25 MED ORDER — OXYCODONE-ACETAMINOPHEN 5-325 MG PO TABS
1.0000 | ORAL_TABLET | ORAL | Status: DC | PRN
Start: 2014-08-25 — End: 2014-08-27
  Administered 2014-08-25 – 2014-08-26 (×2): 1 via ORAL
  Filled 2014-08-25 (×2): qty 1

## 2014-08-25 MED ORDER — ZOLPIDEM TARTRATE 5 MG PO TABS
5.0000 mg | ORAL_TABLET | Freq: Every evening | ORAL | Status: DC | PRN
Start: 2014-08-25 — End: 2014-08-27

## 2014-08-25 MED ORDER — TERBUTALINE SULFATE 1 MG/ML IJ SOLN
INTRAMUSCULAR | Status: AC
  Administered 2014-08-25: 0.25 mg
  Filled 2014-08-25: qty 1

## 2014-08-25 MED ORDER — LABETALOL HCL 100 MG PO TABS
100.0000 mg | ORAL_TABLET | Freq: Once | ORAL | Status: DC
  Filled 2014-08-25 (×3): qty 1

## 2014-08-25 MED ORDER — TETANUS-DIPHTH-ACELL PERTUSSIS 5-2.5-18.5 LF-MCG/0.5 IM SUSP: 0.5000 mL | Freq: Once | INTRAMUSCULAR | Status: DC

## 2014-08-25 MED ORDER — FERROUS SULFATE 325 (65 FE) MG PO TABS
325.0000 mg | ORAL_TABLET | Freq: Every day | ORAL | Status: DC
  Administered 2014-08-25 – 2014-08-27 (×3): 325 mg via ORAL
  Filled 2014-08-25 (×3): qty 1

## 2014-08-25 MED ORDER — WITCH HAZEL-GLYCERIN EX PADS: 1.0000 "application " | MEDICATED_PAD | CUTANEOUS | Status: DC | PRN

## 2014-08-25 MED ORDER — BUPIVACAINE HCL (PF) 0.25 % IJ SOLN
INTRAMUSCULAR | Status: DC | PRN
  Administered 2014-08-25 (×2): 5 mL via EPIDURAL

## 2014-08-25 MED ORDER — EPHEDRINE 5 MG/ML INJ
10.0000 mg | INTRAVENOUS | Status: DC | PRN
  Filled 2014-08-25: qty 2

## 2014-08-25 MED ORDER — PENICILLIN G POTASSIUM 5000000 UNITS IJ SOLR
5.0000 10*6.[IU] | Freq: Once | INTRAMUSCULAR | Status: AC
Start: 2014-08-25 — End: 2014-08-25
  Administered 2014-08-25: 5 10*6.[IU] via INTRAVENOUS
  Filled 2014-08-25: qty 5

## 2014-08-25 MED ORDER — TERBUTALINE SULFATE 1 MG/ML IJ SOLN
0.2500 mg | Freq: Once | INTRAMUSCULAR | Status: AC
  Administered 2014-08-25: 0.25 mg via SUBCUTANEOUS

## 2014-08-25 MED ORDER — PHENYLEPHRINE 40 MCG/ML (10ML) SYRINGE FOR IV PUSH (FOR BLOOD PRESSURE SUPPORT)
80.0000 ug | PREFILLED_SYRINGE | INTRAVENOUS | Status: DC | PRN
  Filled 2014-08-25: qty 10
  Filled 2014-08-25: qty 2

## 2014-08-25 MED ORDER — LANOLIN HYDROUS EX OINT: TOPICAL_OINTMENT | CUTANEOUS | Status: DC | PRN

## 2014-08-25 MED ORDER — PENICILLIN G POTASSIUM 5000000 UNITS IJ SOLR
2.5000 10*6.[IU] | INTRAVENOUS | Status: DC
  Administered 2014-08-25 (×3): 2.5 10*6.[IU] via INTRAVENOUS
  Filled 2014-08-25 (×7): qty 2.5

## 2014-08-25 MED ORDER — SIMETHICONE 80 MG PO CHEW: 80.0000 mg | CHEWABLE_TABLET | ORAL | Status: DC | PRN

## 2014-08-25 MED ORDER — PRENATAL MULTIVITAMIN CH
1.0000 | ORAL_TABLET | Freq: Every day | ORAL | Status: DC
  Administered 2014-08-26: 1 via ORAL
  Filled 2014-08-25 (×2): qty 1

## 2014-08-25 MED ORDER — ALBUTEROL SULFATE (2.5 MG/3ML) 0.083% IN NEBU: 2.5000 mg | INHALATION_SOLUTION | Freq: Four times a day (QID) | RESPIRATORY_TRACT | Status: DC | PRN

## 2014-08-25 MED ORDER — DIPHENHYDRAMINE HCL 50 MG/ML IJ SOLN: 12.5000 mg | INTRAMUSCULAR | Status: DC | PRN

## 2014-08-25 MED ORDER — DIBUCAINE 1 % RE OINT: 1.0000 "application " | TOPICAL_OINTMENT | RECTAL | Status: DC | PRN

## 2014-08-25 MED ORDER — FENTANYL 2.5 MCG/ML BUPIVACAINE 1/10 % EPIDURAL INFUSION (WH - ANES)
14.0000 mL/h | INTRAMUSCULAR | Status: DC | PRN
  Administered 2014-08-25 (×4): 14 mL/h via EPIDURAL
  Filled 2014-08-25 (×3): qty 125

## 2014-08-25 MED ORDER — ONDANSETRON HCL 4 MG/2ML IJ SOLN: 4.0000 mg | INTRAMUSCULAR | Status: DC | PRN

## 2014-08-25 MED ORDER — ONDANSETRON HCL 4 MG PO TABS
4.0000 mg | ORAL_TABLET | ORAL | Status: DC | PRN
Start: 2014-08-25 — End: 2014-08-27

## 2014-08-25 MED ORDER — LIDOCAINE-EPINEPHRINE (PF) 2 %-1:200000 IJ SOLN
INTRAMUSCULAR | Status: DC | PRN
  Administered 2014-08-25 (×4): 5 mL via EPIDURAL

## 2014-08-25 MED ORDER — OXYCODONE-ACETAMINOPHEN 5-325 MG PO TABS: 2.0000 | ORAL_TABLET | ORAL | Status: DC | PRN

## 2014-08-25 MED ORDER — LACTATED RINGERS IV SOLN
500.0000 mL | Freq: Once | INTRAVENOUS | Status: AC
  Administered 2014-08-25: 500 mL via INTRAVENOUS

## 2014-08-25 MED ORDER — TERBUTALINE SULFATE 1 MG/ML IJ SOLN
INTRAMUSCULAR | Status: AC
  Filled 2014-08-25: qty 1

## 2014-08-25 MED ORDER — BENZOCAINE-MENTHOL 20-0.5 % EX AERO: 1.0000 "application " | INHALATION_SPRAY | CUTANEOUS | Status: DC | PRN

## 2014-08-25 NOTE — Progress Notes (Signed)
Labor Progress  Subjective: Feeling a lot of pressure.  Attempted to push, unsuccessful d/t dense epidural.   Objective: BP 141/75 mmHg  Pulse 103  Temp(Src) 98.8 F (37.1 C) (Oral)  Resp 18  Ht 5\' 4"  (1.626 m)  Wt 214 lb (97.07 kg)  BMI 36.72 kg/m2  SpO2 100%  LMP 11/20/2013   Total I/O In: -  Out: 700 [Urine:700] FHT: 135-140, moderate variability, + accels, no decels CTX:  regular, every 2-4 minutes Uterus gravid, soft non tender SVE:  Dilation: Lip/rim Effacement (%): 80 (thick anterior lip) Station: +1 Exam by:: Beverley Sherrard, cnm   Assessment:  IUP at 41.1 weeks NICHD: Category 1 Membranes:  AROM x 8hrs, no s/s of infection Labor progress: transitioning GBS: positive   Plan: Continue labor plan Continuous monitoring Frequent position changes to facilitate fetal rotation and descent. Will reassess with cervical exam at 1600 or earlier if necessary Pt allowed to labor down Dr Raphael Gibney aware       Erika Lewis, CNM, MSN 08/25/2014. 3:28 PM

## 2014-08-25 NOTE — Plan of Care (Signed)
Problem: Phase I Progression Outcomes Goal: Pain controlled with appropriate interventions Outcome: Completed/Met Date Met:  08/25/14 Goal: Tolerating diet Outcome: Completed/Met Date Met:  08/25/14 Goal: IV Pain medications as ordered Outcome: Completed/Met Date Met:  08/25/14

## 2014-08-25 NOTE — Progress Notes (Addendum)
Labor Progress  Subjective: Has an epidural and still uncomfortable, waiting for anesthesia to come give a bolus. FOB at the bedside.    Objective: BP 122/84 mmHg  Pulse 98  Temp(Src) 98 F (36.7 C) (Oral)  Resp 18  Ht 5\' 4"  (1.626 m)  Wt 214 lb (97.07 kg)  BMI 36.72 kg/m2  SpO2 100%  LMP 11/20/2013   Total I/O In: -  Out: 300 [Urine:300] FHT:130, moderate variability,  + accel, occasional variable decel CTX:  regular, every 2-3 minutes Uterus gravid, soft non tender SVE:  Dilation: 5.5 Effacement (%): 80 Station: -2 Exam by:: Adrien Dietzman, cnm   Assessment:  IUP at 41.0 weeks IOl for PD NICHD: Category 2  Membranes:  intact Labor progress:progressing GBS: positive   Plan: Continue labor plan Continuous monitoring Rest Frequent position changes to facilitate fetal descent and recovery. Will reassess with cervical exam at 1100 or earlier if necessary     Tensley Wery, CNM, MSN 08/25/2014. 10:52 AM

## 2014-08-25 NOTE — Plan of Care (Signed)
Problem: Phase I Progression Outcomes Goal: Assess per MD/Nurse,Routine-VS,FHR,UC,Head to Toe assess Outcome: Completed/Met Date Met:  08/25/14 Goal: Obtain and review prenatal records Outcome: Completed/Met Date Met:  08/25/14

## 2014-08-25 NOTE — Plan of Care (Signed)
Problem: Phase I Progression Outcomes Goal: FHR checked 5 minutes after meds (ROM) Rupture of Membranes Outcome: Completed/Met Date Met:  08/25/14

## 2014-08-25 NOTE — Plan of Care (Signed)
Problem: Phase I Progression Outcomes Goal: Pain controlled with appropriate interventions Outcome: Completed/Met Date Met:  08/25/14 Goal: Voiding adequately Outcome: Completed/Met Date Met:  08/25/14 Goal: OOB as tolerated unless otherwise ordered Outcome: Completed/Met Date Met:  08/25/14 Goal: VS, stable, temp < 100.4 degrees F Outcome: Completed/Met Date Met:  08/25/14

## 2014-08-25 NOTE — Progress Notes (Addendum)
Labor Progress  Subjective: Feeling more pressure, requested epidural bolus  Objective: BP 136/57 mmHg  Pulse 104  Temp(Src) 99.8 F (37.7 C) (Axillary)  Resp 16  Ht 5\' 4"  (1.626 m)  Wt 214 lb (97.07 kg)  BMI 36.72 kg/m2  SpO2 100%  LMP 11/20/2013   Total I/O In: -  Out: 300 [Urine:300] FHT: 135 moderate variability, + accel, occasional variable decel CTX:  regular, every 2-5 minutes Uterus gravid, soft non tender SVE:  Dilation: 9 (with swollen anterior lip) Effacement (%): 80 (thick anterior lip) Station: -1, -2 Exam by:: Mechille Varghese, cnm  Anterior cervix very swollen   Assessment:  IUP at 41.0 weeks NICHD: Category 2 Membranes:  AROM clear x 6 hrs Labor progress: progressing GBS: positive   Plan: Continue labor plan Continuous monitoring Rest Frequent position changes to facilitate fetal rotation and descent. Will reassess with cervical exam at  1530 or earlier if necessary     Pinkey Mcjunkin, CNM, MSN 08/25/2014. 12:15 PM

## 2014-08-25 NOTE — Progress Notes (Signed)
Care nurse called to report elevated blood pressures since delivery Filed Vitals:   08/25/14 1646 08/25/14 1702 08/25/14 1717 08/25/14 1744  BP: 140/78 148/81 141/95 147/86  Pulse: 100 89 94 92  Temp:  99.4 F (37.4 C)    TempSrc:  Oral    Resp:  18 18 18   Height:      Weight:      SpO2:        Plan Labetalol 100mg  po bid PIH labs con't txr to PP

## 2014-08-25 NOTE — Progress Notes (Signed)
KC SEDLAK MRN: [id number]  Subjective: -Nurse call requesting provider assessment.  Patient reporting increase in contraction intensity and saturation of underwear.    Objective: BP 141/78 mmHg  Pulse 91  Resp 16  Ht 5\' 4"  (1.626 m)  Wt 214 lb (97.07 kg)  BMI 36.72 kg/m2  SpO2 97%  LMP 11/20/2013     FHT: 125 bpm, Mod Var, + Variable Decels, +Accels UC:   Q1-82min, palpates strong SVE:   Dilation: 1.5 Effacement (%): 50 Station: -3 Exam by:: Gavin Pound, RN  [place] Membranes:Intact  Pitocin: None Cytotec: Initial dose at 2030  Assessment:  IUP at 41wks Cat II FT  Post Dates Pregnancy Cervical Ripening Bishop Score: 4  Plan: -Position change and fluid bolus resolves Cat II FT to Cat I -Hold next dose of cytotec, consider foley bulb if no change in 2 hours but contractions continue -Patient okay for epidural if desired -Give initial dose of PCN now -Continue other mgmt as ordered -Will continue to observe  Daurice Ovando LYNN,CNM, MSN 08/25/2014, 12:37 AM

## 2014-08-25 NOTE — Progress Notes (Addendum)
Erika Lewis MRN: [id number]  Subjective: -Call from nurse to review strip.  Patient continues to feel contractions despite epidural.    Objective: BP 124/64 mmHg  Pulse 76  Temp(Src) 98.1 F (36.7 C)  Resp 16  Ht 5\' 4"  (1.626 m)  Wt 214 lb (97.07 kg)  BMI 36.72 kg/m2  SpO2 100%  LMP 11/20/2013     FHT:125  bpm, Mod Var, + Variable Decels, +Accels UC:   Q1-68min, palpates strong SVE:   Dilation: 1.5 Effacement (%): 50 Station: -3 Exam by:: Erika Lewis, CNM Membranes:Intact Pitocin:None S/P Initial Cytotec dose at 2030  Assessment:  IUP at 41wks Cat II FT  Post Dates Pregnancy Cervical Ripening  Plan: -Terbutaline now -Reassess in 1 hour -Discussed r/b for c/s including but not limited to bleeding, infection, damage to other organs and/or fetus, and need for additional surgeries. -Patient verbalized understanding of c/s risk, no questions or concerns -Continue other mgmt as ordered -Dr. Scotty Court updated on patient status  Erika Lewis,CNM, MSN 08/25/2014, 3:12 AM   Update 0320 S: Patient continues to contract O: FHR: 125 bpm, Mod Var, -Decels, +Accels UC: Q1-23min, palpates moderate A: Cat II FT P: Give additional dose of Terbutaline .25mg  now Position change Reassess in 1 hour   Erika Lewis, CNM 3:28 AM

## 2014-08-25 NOTE — Anesthesia Procedure Notes (Signed)
Epidural Patient location during procedure: OB Start time: 08/25/2014 1:08 AM  Staffing Anesthesiologist: Laparis Durrett Performed by: anesthesiologist   Preanesthetic Checklist Completed: patient identified, site marked, surgical consent, pre-op evaluation, timeout performed, IV checked, risks and benefits discussed and monitors and equipment checked  Epidural Patient position: sitting Prep: site prepped and draped and DuraPrep Patient monitoring: continuous pulse ox and blood pressure Approach: midline Location: L3-L4 Injection technique: LOR air  Needle:  Needle type: Tuohy  Needle gauge: 17 G Needle length: 9 cm and 9 Needle insertion depth: 6.5 cm Catheter type: closed end flexible Catheter size: 19 Gauge Catheter at skin depth: 11.5 cm Test dose: negative  Assessment Events: blood not aspirated, injection not painful, no injection resistance, negative IV test and no paresthesia  Additional Notes Discussed risk of headache, infection, bleeding, nerve injury and failed or incomplete block.  Patient voices understanding and wishes to proceed.  Epidural placed easily on first attempt.  No paresthesia.  Patient tolerated procedure well with no apparent complications.  Charlton Haws, MDReason for block:procedure for pain

## 2014-08-25 NOTE — Progress Notes (Signed)
CASSARA NIDA MRN: [id number]  Subjective: -Patient comfortable with epidural.    Objective: BP 133/88 mmHg  Pulse 88  Temp(Src) 97.9 F (36.6 C) (Oral)  Resp 16  Ht 5\' 4"  (1.626 m)  Wt 214 lb (97.07 kg)  BMI 36.72 kg/m2  SpO2 100%  LMP 11/20/2013     FHT: 135 bpm, Mod Var, -Decels, +Accels UC:   Q2-28min, palpates moderate SVE:   Dilation: 4 Effacement (%): 50 Station: -1 Exam by:: Gavin Pound, CNM  [place] Membranes:AROM at 0645 Pitocin: None  Assessment:  IUP at 41wks Cat I FT  Post Dates Pregnancy S/P Cytotec GBS Positive Amniotomy  Plan: -Discussed fetal tracing -R/B of amniotomy discussed, patient consents to procedure -Continue other mgmt as ordered -Dr. AVS updated on patient status -Report given to V. Standard, CNM  Azia Toutant LYNN,CNM, MSN 08/25/2014, 7:21 AM

## 2014-08-25 NOTE — Anesthesia Preprocedure Evaluation (Signed)
Anesthesia Evaluation  Patient identified by MRN, date of birth, ID band Patient awake    Reviewed: Allergy & Precautions, H&P , NPO status , Patient's Chart, lab work & pertinent test results, reviewed documented beta blocker date and time   History of Anesthesia Complications Negative for: history of anesthetic complications  Airway Mallampati: III  TM Distance: >3 FB Neck ROM: full    Dental  (+) Teeth Intact   Pulmonary asthma (no inhaler use in 1 year) ,  breath sounds clear to auscultation        Cardiovascular negative cardio ROS  Rhythm:regular Rate:Normal     Neuro/Psych negative neurological ROS  negative psych ROS   GI/Hepatic negative GI ROS, Neg liver ROS,   Endo/Other  BMI 36.8  Renal/GU negative Renal ROS     Musculoskeletal   Abdominal   Peds  Hematology negative hematology ROS (+) Sickle cell trait ,   Anesthesia Other Findings   Reproductive/Obstetrics (+) Pregnancy                             Anesthesia Physical Anesthesia Plan  ASA: II  Anesthesia Plan: Epidural   Post-op Pain Management:    Induction:   Airway Management Planned:   Additional Equipment:   Intra-op Plan:   Post-operative Plan:   Informed Consent: I have reviewed the patients History and Physical, chart, labs and discussed the procedure including the risks, benefits and alternatives for the proposed anesthesia with the patient or authorized representative who has indicated his/her understanding and acceptance.     Plan Discussed with:   Anesthesia Plan Comments:         Anesthesia Quick Evaluation

## 2014-08-26 LAB — CBC
HEMATOCRIT: 30.4 % — AB (ref 36.0–46.0)
Hemoglobin: 10.3 g/dL — ABNORMAL LOW (ref 12.0–15.0)
MCH: 27.2 pg (ref 26.0–34.0)
MCHC: 33.9 g/dL (ref 30.0–36.0)
MCV: 80.4 fL (ref 78.0–100.0)
Platelets: 144 10*3/uL — ABNORMAL LOW (ref 150–400)
RBC: 3.78 MIL/uL — AB (ref 3.87–5.11)
RDW: 16.7 % — ABNORMAL HIGH (ref 11.5–15.5)
WBC: 19.4 10*3/uL — ABNORMAL HIGH (ref 4.0–10.5)

## 2014-08-26 NOTE — Progress Notes (Addendum)
Mom states that her left foot still feels like " pins and needles" although she is able to walk on it. It is also slightly swollen, as is ankle (nonpittinig). Contacted Mrs. Lorel Monaco in anesthesiology. Stated will followup on patient. No new orders received.

## 2014-08-26 NOTE — Anesthesia Postprocedure Evaluation (Signed)
Anesthesia Post Note  Patient: Erika Lewis  Procedure(s) Performed: * No procedures listed *  Anesthesia type: Epidural  Patient location: Mother/Baby  Post pain: Pain level controlled  Post assessment: Post-op Vital signs reviewed  Last Vitals:  Filed Vitals:   08/26/14 0658  BP: 124/70  Pulse: 90  Temp: 36.5 C  Resp: 17    Post vital signs: Reviewed  Level of consciousness:alert  Complications: No apparent anesthesia complications

## 2014-08-26 NOTE — Progress Notes (Signed)
Erika Lewis   Subjective: Post Partum Day 1 Vaginal delivery, no laceration Patient up ad lib, denies syncope or dizziness. Reports consuming regular diet without issues and denies N/V No issues with urination and reports bleeding is appropriate  Feeding:  breast Contraceptive plan:   Nexplanon  Objective: Temp:  [97.7 F (36.5 C)-101.4 F (38.6 C)] 97.8 F (36.6 C) (11/26 0900) Pulse Rate:  [88-132] 88 (11/26 0900) Resp:  [16-20] 18 (11/26 0900) BP: (119-181)/(57-98) 131/74 mmHg (11/26 0900) SpO2:  [99 %] 99 % (11/26 0658)  Physical Exam:  General: alert and cooperative Ext: WNL, no edema. No evidence of DVT seen on physical exam. Breast: Soft filling Lungs: CTAB Heart RRR without murmur  Abdomen:  Soft, fundus firm, lochia scant, + bowel sounds, non distended, non tender Lochia: appropriate Uterine Fundus: firm Laceration: none    Recent Labs  08/25/14 1915 08/26/14 0610  HGB 12.2 10.3*  HCT 35.6* 30.4*    Assessment S/P Vaginal Delivery-Day 1 Stable  Normal Involution Breastfeeding Circumcision:  OP  Plan: Continue current care Dr. Landry Mellow updated on patient status  Plan for discharge tomorrow and Breastfeeding Lactation support   Clarence Dunsmore, CNM, MSN 08/26/2014, 9:35 AM

## 2014-08-26 NOTE — Lactation Note (Signed)
This note was copied from the chart of Erika Lewis. Lactation Consultation Note Mom encouraged to feed baby 8-12 times/24 hours and with feeding cues. Mom encouraged to waken baby for feeds. Encouraged to call for assistance if needed and to verify proper latch. Educated about newborn behavior. BF her first child for only a week. Had difficulty latching and did breast and bottle. Mom stated this baby is BF much better and doing well. Denies painful latching and feeding. Baby sleeping at this time. Referred to Baby and Me Book in Breastfeeding section Pg. 22-23 for position options and Proper latch demonstration.[place] brochure given w/resources, support groups and [place] services. Patient Name: Erika Tahari Clabaugh ENIDP'O Date: 08/26/2014 Reason for consult: Initial assessment   Maternal Data Has patient been taught Hand Expression?: Yes Does the patient have breastfeeding experience prior to this delivery?: Yes  Feeding Feeding Type: Breast Fed Length of feed: 25 min  LATCH Score/Interventions                      Lactation Tools Discussed/Used     Consult Status Consult Status: Follow-up Date: 08/27/14 Follow-up type: In-patient    Saje Gallop, Elta [place] 08/26/2014, 2:02 AM

## 2014-08-26 NOTE — Lactation Note (Signed)
This note was copied from the chart of Erika Shailee Foots. Lactation Consultation Note follow up visit at 25 hours of age.  Baby has had 7 feedings with 2 voids and 4 stools in lifetime.  Mom reports baby just finished a 5 minutes feeding and is sleepy now.  Mom reports she is rubbing in EBM to her nipples they are a little sore.   Mom is describing a good latch with scores of "9" previously, but not observed by LC.  Encouraged mom to have night nurse observe latch and to call for assist as needed.    Patient Name: Erika Lewis [id number] Date: 08/26/2014 Reason for consult: Follow-up assessment   Maternal Data    Feeding Feeding Type: Breast Fed Length of feed: 5 min  LATCH Score/Interventions                Intervention(s): Breastfeeding basics reviewed     Lactation Tools Discussed/Used     Consult Status Consult Status: Follow-up Date: 08/27/14 Follow-up type: In-patient    Justice Britain 08/26/2014, 6:04 PM

## 2014-08-26 NOTE — Plan of Care (Signed)
Problem: Phase I Progression Outcomes Goal: Initial discharge plan identified Outcome: Completed/Met Date Met:  08/26/14  Problem: Phase II Progression Outcomes Goal: Incision intact & without signs/symptoms of infection Outcome: Not Applicable Date Met:  57/49/35

## 2014-08-26 NOTE — Anesthesia Post-op Follow-up Note (Signed)
Called received 11/26 @ 2320 regarding pt c/o of left foot numbness, tingling and feeling of pins sensation. MDA notified. Pt left foot assessed, + 2 pp, wd to touch. - dvt assessment, pt states able to weight bear. Pt educated to epidural resolving. Will f/u in am

## 2014-08-27 ENCOUNTER — Inpatient Hospital Stay (HOSPITAL_COMMUNITY): Payer: Medicaid Other

## 2014-08-27 ENCOUNTER — Ambulatory Visit: Payer: Self-pay

## 2014-08-27 MED ORDER — IBUPROFEN 600 MG PO TABS: 600.0000 mg | ORAL_TABLET | Freq: Four times a day (QID) | ORAL | Status: DC | PRN

## 2014-08-27 MED ORDER — OXYCODONE-ACETAMINOPHEN 5-325 MG PO TABS: 1.0000 | ORAL_TABLET | ORAL | Status: DC | PRN

## 2014-08-27 MED ORDER — FERROUS SULFATE 325 (65 FE) MG PO TABS: 325.0000 mg | ORAL_TABLET | Freq: Two times a day (BID) | ORAL | Status: DC

## 2014-08-27 NOTE — Anesthesia Postprocedure Evaluation (Signed)
Patient c/o left foot "still numb". Motor strength normal. Dr. Jillyn Hidden made aware.

## 2014-08-27 NOTE — Addendum Note (Signed)
Addendum  created 08/27/14 2527 by Talbot Grumbling, CRNA   Modules edited: Charges VN, Notes Section   Notes Section:  File: [id number]

## 2014-08-27 NOTE — Discharge Summary (Signed)
Vaginal Delivery Discharge Summary  Erika Lewis  DOB:    April 19, 1986 MRN:    [id number] CSN:    [id number]  Date of admission:                  08/24/14  Date of discharge:                   08/27/14  Procedures this admission:   SVB  Date of Delivery: 08/25/14  Newborn Data:  Live born female  Birth Weight: 8 lb 10.5 oz (3925 g) APGAR: 9, 9  Home with mother. Name: Erika Lewis Circumcision Plan: Outpatient  History of Present Illness:  Ms. Erika Lewis is a 28 y.o. female, [phone], who presents at [redacted]w[redacted]d weeks gestation. The patient has been followed at the [facility] of [facility]. She was admitted induction of labor for postdates. Her pregnancy has been complicated by:  Patient Active Problem List   Diagnosis Date Noted  . Asthma 08/25/2014  . Sickle cell trait 08/25/2014  . Uterine leiomyoma 08/25/2014  . Vaginal delivery 08/25/2014  . Post-dates pregnancy 08/24/2014     Hospital Course:  Admitted 08/24/14 for induction for postdates. Positive GBS. Progressed with membranes stripping, then had [employer], with SROM. Initially planned waterbirth, but elected to have epidural (did not use tub).  Delivery was performed by [facility], CNM, without complication. Patient and baby tolerated the procedure without difficulty, with no lacerations noted. Infant status was stable and remained in room with mother.  Mother and infant then had an uncomplicated postpartum course, with breast and bottle feeding going well. Patient did have some persistent numbness of right foot, but she had no compromise of mobility.  She was seen by Anesthesia and advised this should resolve spontaneously.  On day 2, mom's physical exam was WNL, and she was discharged home in stable condition. Contraception plan was Nexplanon.  She received adequate benefit from po pain medications, and was d/c'd home with Ibuprophen and  Percocet.   Feeding:  breast and bottle  Contraception:  Nexplanon  Discharge hemoglobin:  HEMOGLOBIN  Date Value Ref Range Status  08/26/2014 10.3* 12.0 - 15.0 g/dL Final   HCT  Date Value Ref Range Status  08/26/2014 30.4* 36.0 - 46.0 % Final    Discharge Physical Exam:   General: alert Lochia: appropriate Uterine Fundus: firm Incision: Perineum intact DVT Evaluation: No evidence of DVT seen on physical exam. Negative Homan's sign.  Intrapartum Procedures: spontaneous vaginal delivery Postpartum Procedures: none Complications-Operative and Postpartum: Persistent numbness of right foot, but no compromise of mobility.  Seen by Anesthesia.  Discharge Diagnoses: Term Pregnancy-delivered, GBS positive  Discharge Information:  Activity:           pelvic rest Diet:                routine Medications: Ibuprofen and Percocet Condition:      stable Instructions:     Discharge to: home  Follow-up Information    Follow up with [facility]. Schedule an appointment as soon as possible for a visit in 5 weeks.   Specialty:  Obstetrics and Gynecology   Why:  Call for any questions or concerns.  Make postpartum appt for 5 weeks, then we will plan Nexplanon insertion at 6 weeks.   Contact information:   [address] [place] [address] [phone]       Donnel Saxon CNM 08/27/2014  8:17 AM

## 2014-08-27 NOTE — Addendum Note (Signed)
Addendum  created 08/27/14 0001 by Genevie Ann, CRNA   Modules edited: Notes Section   Notes Section:  File: [id number]

## 2014-08-27 NOTE — Plan of Care (Signed)
Problem: Phase II Progression Outcomes Goal: Tolerating diet Outcome: Completed/Met Date Met:  08/27/14     

## 2014-08-27 NOTE — Plan of Care (Signed)
Problem: Phase II Progression Outcomes Goal: Pain controlled on oral analgesia Outcome: Completed/Met Date Met:  08/27/14 Goal: Progress activity as tolerated unless otherwise ordered Outcome: Completed/Met Date Met:  08/27/14 Goal: Afebrile, VS remain stable Outcome: Completed/Met Date Met:  08/27/14  Problem: Discharge Progression Outcomes Goal: Activity appropriate for discharge plan Outcome: Completed/Met Date Met:  08/27/14 Goal: Tolerating diet Outcome: Completed/Met Date Met:  08/27/14 Goal: Pain controlled with appropriate interventions Outcome: Completed/Met Date Met:  08/27/14 Goal: Afebrile, VS remain stable at discharge Outcome: Completed/Met Date Met:  08/27/14 Goal: Discharge plan in place and appropriate Outcome: Completed/Met Date Met:  08/27/14

## 2014-08-27 NOTE — Discharge Instructions (Signed)
Postpartum Care After Vaginal Delivery °After you deliver your newborn (postpartum period), the usual stay in the hospital is 24-72 hours. If there were problems with your labor or delivery, or if you have other medical problems, you might be in the hospital longer.  °While you are in the hospital, you will receive help and instructions on how to care for yourself and your newborn during the postpartum period.  °While you are in the hospital: °· Be sure to tell your nurses if you have pain or discomfort, as well as where you feel the pain and what makes the pain worse. °· If you had an incision made near your vagina (episiotomy) or if you had some tearing during delivery, the nurses may put ice packs on your episiotomy or tear. The ice packs may help to reduce the pain and swelling. °· If you are breastfeeding, you may feel uncomfortable contractions of your uterus for a couple of weeks. This is normal. The contractions help your uterus get back to normal size. °· It is normal to have some bleeding after delivery. °¨ For the first 1-3 days after delivery, the flow is red and the amount may be similar to a period. °¨ It is common for the flow to start and stop. °¨ In the first few days, you may pass some small clots. Let your nurses know if you begin to pass large clots or your flow increases. °¨ Do not  flush blood clots down the toilet before having the nurse look at them. °¨ During the next 3-10 days after delivery, your flow should become more watery and pink or brown-tinged in color. °¨ Ten to fourteen days after delivery, your flow should be a small amount of yellowish-white discharge. °¨ The amount of your flow will decrease over the first few weeks after delivery. Your flow may stop in 6-8 weeks. Most women have had their flow stop by 12 weeks after delivery. °· You should change your sanitary pads frequently. °· Wash your hands thoroughly with soap and water for at least 20 seconds after changing pads, using  the toilet, or before holding or feeding your newborn. °· You should feel like you need to empty your bladder within the first 6-8 hours after delivery. °· In case you become weak, lightheaded, or faint, call your nurse before you get out of bed for the first time and before you take a shower for the first time. °· Within the first few days after delivery, your breasts may begin to feel tender and full. This is called engorgement. Breast tenderness usually goes away within 48-72 hours after engorgement occurs. You may also notice milk leaking from your breasts. If you are not breastfeeding, do not stimulate your breasts. Breast stimulation can make your breasts produce more milk. °· Spending as much time as possible with your newborn is very important. During this time, you and your newborn can feel close and get to know each other. Having your newborn stay in your room (rooming in) will help to strengthen the bond with your newborn.  It will give you time to get to know your newborn and become comfortable caring for your newborn. °· Your hormones change after delivery. Sometimes the hormone changes can temporarily cause you to feel sad or tearful. These feelings should not last more than a few days. If these feelings last longer than that, you should talk to your caregiver. °· If desired, talk to your caregiver about methods of family planning or contraception. °·   Talk to your caregiver about immunizations. Your caregiver may want you to have the following immunizations before leaving the hospital: °¨ Tetanus, diphtheria, and pertussis (Tdap) or tetanus and diphtheria (Td) immunization. It is very important that you and your family (including grandparents) or others caring for your newborn are up-to-date with the Tdap or Td immunizations. The Tdap or Td immunization can help protect your newborn from getting ill. °¨ Rubella immunization. °¨ Varicella (chickenpox) immunization. °¨ Influenza immunization. You should  receive this annual immunization if you did not receive the immunization during your pregnancy. °Document Released: 07/15/2007 Document Revised: 06/11/2012 Document Reviewed: 05/14/2012 °ExitCare® Patient Information ©2015 [employer]. This information is not intended to replace advice given to you by your health care provider. Make sure you discuss any questions you have with your health care provider. ° °Etonogestrel implant °What is this medicine? °ETONOGESTREL (et oh noe JES trel) is a contraceptive (birth control) device. It is used to prevent pregnancy. It can be used for up to 3 years. °This medicine may be used for other purposes; ask your health care provider or pharmacist if you have questions. °COMMON BRAND NAME(S): Implanon, Nexplanon °What should I tell my health care provider before I take this medicine? °They need to know if you have any of these conditions: °-abnormal vaginal bleeding °-blood vessel disease or blood clots °-cancer of the breast, cervix, or liver °-depression °-diabetes °-gallbladder disease °-headaches °-heart disease or recent heart attack °-high blood pressure °-high cholesterol °-kidney disease °-liver disease °-renal disease °-seizures °-tobacco smoker °-an unusual or allergic reaction to etonogestrel, other hormones, anesthetics or antiseptics, medicines, foods, dyes, or preservatives °-pregnant or trying to get pregnant °-breast-feeding °How should I use this medicine? °This device is inserted just under the skin on the inner side of your upper arm by a health care professional. °Talk to your pediatrician regarding the use of this medicine in children. Special care may be needed. °Overdosage: If you think you've taken too much of this medicine contact a poison control center or emergency room at once. °Overdosage: If you think you have taken too much of this medicine contact a poison control center or emergency room at once. °NOTE: This medicine is only for you. Do not share  this medicine with others. °What if I miss a dose? °This does not apply. °What may interact with this medicine? °Do not take this medicine with any of the following medications: °-amprenavir °-bosentan °-fosamprenavir °This medicine may also interact with the following medications: °-barbiturate medicines for inducing sleep or treating seizures °-certain medicines for fungal infections like ketoconazole and itraconazole °-griseofulvin °-medicines to treat seizures like carbamazepine, felbamate, oxcarbazepine, phenytoin, topiramate °-modafinil °-phenylbutazone °-rifampin °-some medicines to treat HIV infection like atazanavir, indinavir, lopinavir, nelfinavir, tipranavir, ritonavir °-St. John's wort °This list may not describe all possible interactions. Give your health care provider a list of all the medicines, herbs, non-prescription drugs, or dietary supplements you use. Also tell them if you smoke, drink alcohol, or use illegal drugs. Some items may interact with your medicine. °What should I watch for while using this medicine? °This product does not protect you against HIV infection (AIDS) or other sexually transmitted diseases. °You should be able to feel the implant by pressing your fingertips over the skin where it was inserted. Tell your doctor if you cannot feel the implant. °What side effects may I notice from receiving this medicine? °Side effects that you should report to your doctor or health care professional as soon as   possible: °-allergic reactions like skin rash, itching or hives, swelling of the face, lips, or tongue °-breast lumps °-changes in vision °-confusion, trouble speaking or understanding °-dark urine °-depressed mood °-general ill feeling or flu-like symptoms °-light-colored stools °-loss of appetite, nausea °-right upper belly pain °-severe headaches °-severe pain, swelling, or tenderness in the abdomen °-shortness of breath, chest pain, swelling in a leg °-signs of pregnancy °-sudden  numbness or weakness of the face, arm or leg °-trouble walking, dizziness, loss of balance or coordination °-unusual vaginal bleeding, discharge °-unusually weak or tired °-yellowing of the eyes or skin °Side effects that usually do not require medical attention (Report these to your doctor or health care professional if they continue or are bothersome.): °-acne °-breast pain °-changes in weight °-cough °-fever or chills °-headache °-irregular menstrual bleeding °-itching, burning, and vaginal discharge °-pain or difficulty passing urine °-sore throat °This list may not describe all possible side effects. Call your doctor for medical advice about side effects. You may report side effects to FDA at 1-800-FDA-1088. °Where should I keep my medicine? °This drug is given in a hospital or clinic and will not be stored at home. °NOTE: This sheet is a summary. It may not cover all possible information. If you have questions about this medicine, talk to your doctor, pharmacist, or health care provider. °© 2015, [employer]vier/[employer]. (2012-03-24 15:37:45) ° °

## 2014-08-27 NOTE — Lactation Note (Signed)
This note was copied from the chart of Erika Lewis. Lactation Consultation Note  Patient Name: Erika Lewis TDHRC'B Date: 08/27/2014   Comfort gelpads were given to this mom for nipple care prior to discharge  Maternal Data    Feeding    LATCH Score/Interventions                      Lactation Tools Discussed/Used     Consult Status      Bernita Buffy 08/27/2014, 3:59 PM

## 2014-08-30 NOTE — Progress Notes (Signed)
Post discharge chart review completed.  

## 2015-06-08 ENCOUNTER — Encounter (HOSPITAL_COMMUNITY): Payer: Self-pay | Admitting: Emergency Medicine

## 2015-06-08 ENCOUNTER — Emergency Department (HOSPITAL_COMMUNITY)
Admission: EM | Admit: 2015-06-08 | Discharge: 2015-06-08 | Disposition: A | Discharge status: Home / Self Care | Payer: No Typology Code available for payment source | Attending: Emergency Medicine | Admitting: Emergency Medicine

## 2015-06-08 DIAGNOSIS — Y9389 Activity, other specified: Secondary | ICD-10-CM | POA: Diagnosis not present

## 2015-06-08 DIAGNOSIS — Z8619 Personal history of other infectious and parasitic diseases: Secondary | ICD-10-CM | POA: Diagnosis not present

## 2015-06-08 DIAGNOSIS — S40022A Contusion of left upper arm, initial encounter: Secondary | ICD-10-CM | POA: Diagnosis not present

## 2015-06-08 DIAGNOSIS — S161XXA Strain of muscle, fascia and tendon at neck level, initial encounter: Secondary | ICD-10-CM

## 2015-06-08 DIAGNOSIS — Z79899 Other long term (current) drug therapy: Secondary | ICD-10-CM | POA: Insufficient documentation

## 2015-06-08 DIAGNOSIS — Z862 Personal history of diseases of the blood and blood-forming organs and certain disorders involving the immune mechanism: Secondary | ICD-10-CM | POA: Insufficient documentation

## 2015-06-08 DIAGNOSIS — Y998 Other external cause status: Secondary | ICD-10-CM | POA: Insufficient documentation

## 2015-06-08 DIAGNOSIS — Y92410 Unspecified street and highway as the place of occurrence of the external cause: Secondary | ICD-10-CM | POA: Diagnosis not present

## 2015-06-08 DIAGNOSIS — T148XXA Other injury of unspecified body region, initial encounter: Secondary | ICD-10-CM

## 2015-06-08 DIAGNOSIS — S199XXA Unspecified injury of neck, initial encounter: Secondary | ICD-10-CM | POA: Diagnosis present

## 2015-06-08 DIAGNOSIS — J45909 Unspecified asthma, uncomplicated: Secondary | ICD-10-CM | POA: Diagnosis not present

## 2015-06-08 MED ORDER — NAPROXEN 375 MG PO TABS: 375.0000 mg | ORAL_TABLET | Freq: Two times a day (BID) | ORAL | Status: DC

## 2015-06-08 MED ORDER — IBUPROFEN 800 MG PO TABS
800.0000 mg | ORAL_TABLET | Freq: Once | ORAL | Status: AC
  Administered 2015-06-08: 800 mg via ORAL
  Filled 2015-06-08: qty 1

## 2015-06-08 MED ORDER — CYCLOBENZAPRINE HCL 10 MG PO TABS: 10.0000 mg | ORAL_TABLET | Freq: Two times a day (BID) | ORAL | Status: DC | PRN

## 2015-06-08 MED ORDER — TRAMADOL HCL 50 MG PO TABS: 50.0000 mg | ORAL_TABLET | Freq: Four times a day (QID) | ORAL | Status: DC | PRN
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
## 2015-06-10 ENCOUNTER — Emergency Department (HOSPITAL_COMMUNITY)
Admission: EM | Admit: 2015-06-10 | Discharge: 2015-06-10 | Disposition: A | Discharge status: Home / Self Care | Payer: No Typology Code available for payment source | Attending: Emergency Medicine | Admitting: Emergency Medicine

## 2015-06-10 ENCOUNTER — Encounter (HOSPITAL_COMMUNITY): Payer: Self-pay

## 2015-06-10 DIAGNOSIS — Y9389 Activity, other specified: Secondary | ICD-10-CM | POA: Diagnosis not present

## 2015-06-10 DIAGNOSIS — Y998 Other external cause status: Secondary | ICD-10-CM | POA: Insufficient documentation

## 2015-06-10 DIAGNOSIS — S59912A Unspecified injury of left forearm, initial encounter: Secondary | ICD-10-CM | POA: Diagnosis not present

## 2015-06-10 DIAGNOSIS — Z79899 Other long term (current) drug therapy: Secondary | ICD-10-CM | POA: Insufficient documentation

## 2015-06-10 DIAGNOSIS — Z8619 Personal history of other infectious and parasitic diseases: Secondary | ICD-10-CM | POA: Diagnosis not present

## 2015-06-10 DIAGNOSIS — Z862 Personal history of diseases of the blood and blood-forming organs and certain disorders involving the immune mechanism: Secondary | ICD-10-CM | POA: Diagnosis not present

## 2015-06-10 DIAGNOSIS — J45909 Unspecified asthma, uncomplicated: Secondary | ICD-10-CM | POA: Insufficient documentation

## 2015-06-10 DIAGNOSIS — Z791 Long term (current) use of non-steroidal anti-inflammatories (NSAID): Secondary | ICD-10-CM | POA: Diagnosis not present

## 2015-06-10 DIAGNOSIS — Y92410 Unspecified street and highway as the place of occurrence of the external cause: Secondary | ICD-10-CM | POA: Diagnosis not present

## 2015-06-10 DIAGNOSIS — M79602 Pain in left arm: Secondary | ICD-10-CM

## 2015-06-10 MED ORDER — ACETAMINOPHEN 325 MG PO TABS: 325.0000 mg | ORAL_TABLET | Freq: Four times a day (QID) | ORAL | Status: DC | PRN

## 2015-06-10 NOTE — Discharge Instructions (Signed)
Musculoskeletal Pain Musculoskeletal pain is muscle and boney aches and pains. These pains can occur in any part of the body. Your caregiver may treat you without knowing the cause of the pain. They may treat you if blood or urine tests, X-rays, and other tests were normal.  CAUSES There is often not a definite cause or reason for these pains. These pains may be caused by a type of germ (virus). The discomfort may also come from overuse. Overuse includes working out too hard when your body is not fit. Boney aches also come from weather changes. Bone is sensitive to atmospheric pressure changes. HOME CARE INSTRUCTIONS   Ask when your test results will be ready. Make sure you get your test results.  Only take over-the-counter or prescription medicines for pain, discomfort, or fever as directed by your caregiver. If you were given medications for your condition, do not drive, operate machinery or power tools, or sign legal documents for 24 hours. Do not drink alcohol. Do not take sleeping pills or other medications that may interfere with treatment.  Continue all activities unless the activities cause more pain. When the pain lessens, slowly resume normal activities. Gradually increase the intensity and duration of the activities or exercise.  During periods of severe pain, bed rest may be helpful. Lay or sit in any position that is comfortable.  Putting ice on the injured area.  Put ice in a bag.  Place a towel between your skin and the bag.  Leave the ice on for 15 to 20 minutes, 3 to 4 times a day.  Follow up with your caregiver for continued problems and no reason can be found for the pain. If the pain becomes worse or does not go away, it may be necessary to repeat tests or do additional testing. Your caregiver may need to look further for a possible cause. SEEK IMMEDIATE MEDICAL CARE IF:  You have pain that is getting worse and is not relieved by medications.  You develop chest pain  that is associated with shortness or breath, sweating, feeling sick to your stomach (nauseous), or throw up (vomit).  Your pain becomes localized to the abdomen.  You develop any new symptoms that seem different or that concern you. MAKE SURE YOU:   Understand these instructions.  Will watch your condition.  Will get help right away if you are not doing well or get worse. Document Released: 09/17/2005 Document Revised: 12/10/2011 Document Reviewed: 05/22/2013 [facility] Patient Information 2015 [place], [place]. This information is not intended to replace advice given to you by your health care provider. Make sure you discuss any questions you have with your health care provider.   Pain of Unknown Etiology (Pain Without a Known Cause) You have come to your caregiver because of pain. Pain can occur in any part of the body. Often there is not a definite cause. If your laboratory (blood or urine) work was normal and X-rays or other studies were normal, your caregiver may treat you without knowing the cause of the pain. An example of this is the headache. Most headaches are diagnosed by taking a history. This means your caregiver asks you questions about your headaches. Your caregiver determines a treatment based on your answers. Usually testing done for headaches is normal. Often testing is not done unless there is no response to medications. Regardless of where your pain is located today, you can be given medications to make you comfortable. If no physical cause of pain can be  found, most cases of pain will gradually leave as suddenly as they came.  If you have a painful condition and no reason can be found for the pain, it is important that you follow up with your caregiver. If the pain becomes worse or does not go away, it may be necessary to repeat tests and look further for a possible cause.  Only take over-the-counter or prescription medicines for pain, discomfort, or fever as directed by your  caregiver.  For the protection of your privacy, test results cannot be given over the phone. Make sure you receive the results of your test. Ask how these results are to be obtained if you have not been informed. It is your responsibility to obtain your test results.  You may continue all activities unless the activities cause more pain. When the pain lessens, it is important to gradually resume normal activities. Resume activities by beginning slowly and gradually increasing the intensity and duration of the activities or exercise. During periods of severe pain, bed rest may be helpful. Lie or sit in any position that is comfortable.  Ice used for acute (sudden) conditions may be effective. Use a large plastic bag filled with ice and wrapped in a towel. This may provide pain relief.  See your caregiver for continued problems. Your caregiver can help or refer you for exercises or physical therapy if necessary. If you were given medications for your condition, do not drive, operate machinery or power tools, or sign legal documents for 24 hours. Do not drink alcohol, take sleeping pills, or take other medications that may interfere with treatment. See your caregiver immediately if you have pain that is becoming worse and not relieved by medications. Document Released: 06/12/2001 Document Revised: 07/08/2013 Document Reviewed: 09/17/2005 [facility] Patient Information 2015 [place], [place]. This information is not intended to replace advice given to you by your health care provider. Make sure you discuss any questions you have with your health care provider.  Please return to ED if you experience constant numbness, tingling, loss of sensory/motor function or if your arm turns blue or changes in temperature.
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
## 2016-02-06 ENCOUNTER — Encounter (HOSPITAL_COMMUNITY): Payer: Self-pay | Admitting: Emergency Medicine

## 2016-02-06 ENCOUNTER — Emergency Department (HOSPITAL_COMMUNITY)
Admission: EM | Admit: 2016-02-06 | Discharge: 2016-02-07 | Disposition: A | Discharge status: Home / Self Care | Payer: Medicaid Other | Source: Home / Self Care | Attending: Emergency Medicine | Admitting: Emergency Medicine

## 2016-02-06 ENCOUNTER — Emergency Department (HOSPITAL_COMMUNITY): Payer: Medicaid Other

## 2016-02-06 DIAGNOSIS — Z791 Long term (current) use of non-steroidal anti-inflammatories (NSAID): Secondary | ICD-10-CM | POA: Diagnosis not present

## 2016-02-06 DIAGNOSIS — G5601 Carpal tunnel syndrome, right upper limb: Secondary | ICD-10-CM | POA: Diagnosis not present

## 2016-02-06 DIAGNOSIS — Z79899 Other long term (current) drug therapy: Secondary | ICD-10-CM | POA: Diagnosis not present

## 2016-02-06 DIAGNOSIS — M25531 Pain in right wrist: Secondary | ICD-10-CM | POA: Diagnosis present

## 2016-02-06 DIAGNOSIS — M7989 Other specified soft tissue disorders: Secondary | ICD-10-CM | POA: Insufficient documentation

## 2016-02-06 DIAGNOSIS — M79641 Pain in right hand: Secondary | ICD-10-CM

## 2016-02-06 DIAGNOSIS — J45909 Unspecified asthma, uncomplicated: Secondary | ICD-10-CM

## 2016-02-06 DIAGNOSIS — Z8619 Personal history of other infectious and parasitic diseases: Secondary | ICD-10-CM | POA: Diagnosis not present

## 2016-02-06 DIAGNOSIS — Z862 Personal history of diseases of the blood and blood-forming organs and certain disorders involving the immune mechanism: Secondary | ICD-10-CM | POA: Diagnosis not present

## 2016-02-06 NOTE — ED Notes (Signed)
Pt. reports right hand pain with mild swelling onset last Tuesday unrelieved by OTC pain medication , denies injury , pain increases with movement . 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

## 2016-02-06 NOTE — ED Notes (Signed)
Called for Pt in main waiting room and sub waiting no answer.

## 2016-02-07 ENCOUNTER — Emergency Department (HOSPITAL_COMMUNITY)
Admission: EM | Admit: 2016-02-07 | Discharge: 2016-02-07 | Disposition: A | Discharge status: Home / Self Care | Payer: Medicaid Other | Attending: Emergency Medicine | Admitting: Emergency Medicine

## 2016-02-07 ENCOUNTER — Encounter (HOSPITAL_COMMUNITY): Payer: Self-pay

## 2016-02-07 DIAGNOSIS — Z791 Long term (current) use of non-steroidal anti-inflammatories (NSAID): Secondary | ICD-10-CM | POA: Insufficient documentation

## 2016-02-07 DIAGNOSIS — Z8619 Personal history of other infectious and parasitic diseases: Secondary | ICD-10-CM | POA: Insufficient documentation

## 2016-02-07 DIAGNOSIS — G5601 Carpal tunnel syndrome, right upper limb: Secondary | ICD-10-CM | POA: Insufficient documentation

## 2016-02-07 DIAGNOSIS — Z862 Personal history of diseases of the blood and blood-forming organs and certain disorders involving the immune mechanism: Secondary | ICD-10-CM | POA: Insufficient documentation

## 2016-02-07 DIAGNOSIS — J45909 Unspecified asthma, uncomplicated: Secondary | ICD-10-CM | POA: Insufficient documentation

## 2016-02-07 DIAGNOSIS — Z79899 Other long term (current) drug therapy: Secondary | ICD-10-CM | POA: Insufficient documentation

## 2016-02-07 MED ORDER — PREDNISONE 10 MG PO TABS: 20.0000 mg | ORAL_TABLET | Freq: Two times a day (BID) | ORAL | Status: DC

## 2016-02-07 MED ORDER — CYCLOBENZAPRINE HCL 10 MG PO TABS: 10.0000 mg | ORAL_TABLET | Freq: Two times a day (BID) | ORAL | Status: DC | PRN

## 2016-02-07 NOTE — ED Provider Notes (Signed)
CSN: [id number]     Arrival date & time 02/07/16  1606 History  By signing my name below, I, Erika Lewis, attest that this documentation has been prepared under the direction and in the presence of [facility] NP. Electronically Signed: Altamease Lewis, ED Scribe. 02/07/2016 5:18 PM   Chief Complaint  Patient presents with  . Wrist Pain    The history is provided by the patient. No language interpreter was used.   Erika Lewis is a 30 y.o. female who presents to the Emergency Department complaining of constant, burning/throbbing, 8/10 in severity, right hand/wrist pain and swelling with onset 1 week ago. The pain worsened yesterday and started to shoot up the arm.  OTC pain medication has provided insufficient pain relief at home. 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  Pt came to the ED yesterday and had an XR from triage but left before being seen due to the wait time.   Past Medical History  Diagnosis Date  . Asthma   . Sickle cell trait ([place])   . Hx of varicella    History reviewed. No pertinent past surgical history. Family History  Problem Relation Age of Onset  . Hypertension Mother   . Diabetes Mother   . Sickle cell trait Mother   . Diabetes Maternal Grandmother   . Hypertension Maternal Grandmother   . Sickle cell trait Maternal Grandmother   . Sickle cell trait Sister   . Sickle cell trait Son   . Sickle cell trait Maternal Aunt    Social History  Substance Use Topics  . Smoking status: Never Smoker   . Smokeless tobacco: Never Used  . Alcohol Use: No   OB History    Gravida Para Term Preterm AB TAB SAB Ectopic Multiple Living   5 2 2  2 2    0 1     Review of Systems  Musculoskeletal: Positive for arthralgias.       Right hand/wrist pain and swelling   All other systems reviewed and are negative.     Allergies  Review of patient's allergies indicates no known allergies.  Home Medications    Prior to Admission medications   Medication Sig Start Date End Date Taking? Authorizing Provider  acetaminophen (TYLENOL) 325 MG tablet Take 1 tablet (325 mg total) by mouth every 6 (six) hours as needed. 06/10/15   Gloriann Loan, PA-C  cyclobenzaprine (FLEXERIL) 10 MG tablet Take 1 tablet (10 mg total) by mouth 2 (two) times daily as needed for muscle spasms. 02/07/16   Arlina Sabina Bunnie Pion, NP  guaiFENesin (MUCINEX) 600 MG 12 hr tablet Take 600 mg by mouth 2 (two) times daily.    Historical Provider, MD  naproxen (NAPROSYN) 375 MG tablet Take 1 tablet (375 mg total) by mouth 2 (two) times daily. 06/08/15   Margarita Mail, PA-C  predniSONE (DELTASONE) 10 MG tablet Take 2 tablets (20 mg total) by mouth 2 (two) times daily with a meal. 02/07/16   Jamol Ginyard Bunnie Pion, NP  traMADol (ULTRAM) 50 MG tablet Take 1 tablet (50 mg total) by mouth every 6 (six) hours as needed. 06/08/15   Abigail Harris, PA-C   BP 110/56 mmHg  Pulse 81  Temp(Src) 98.1 F (36.7 C) (Oral)  Resp 18  SpO2 100%  LMP 01/26/2016 (Approximate) Physical Exam  Constitutional: She is oriented to person, place, and time. She appears well-developed and well-nourished.  Eyes: EOM are normal.  Neck: Neck supple.  Pulmonary/Chest: Effort normal.  Abdominal: Soft. There is no tenderness.  Musculoskeletal:  RUE: Tenderness in the thenar area with palpitation. Pain with ROM of the thumb and index finger. Radial pulses are 2+ Adequate circulation Good touch sensation Grips are equal Swelling noted to the dorsum of the right hand +Tinel's sign  Neurological: She is alert and oriented to person, place, and time. No cranial nerve deficit.  Skin: Skin is warm and dry.  Nursing note and vitals reviewed.   ED Course  Procedures (including critical care time) DIAGNOSTIC STUDIES: Oxygen Saturation is 100% on RA,  normal by my interpretation.    COORDINATION OF CARE: 4:39 PM Discussed treatment plan which includes splinting and steroids with pt at  bedside and pt agreed to plan.  Labs Review Labs Reviewed - No data to display  Imaging Review Dg Hand Complete Right  02/06/2016  CLINICAL DATA:  Right hand pain and swelling for 1 week. No known injury. EXAM: RIGHT HAND - COMPLETE 3+ VIEW COMPARISON:  None. FINDINGS: There is no evidence of fracture or dislocation. There is no evidence of arthropathy or other focal bone abnormality. Soft tissues are unremarkable. No evidence of soft tissue gas or radiopaque foreign body. IMPRESSION: Negative. Electronically Signed   By: Earle Gell M.D.   On: 02/06/2016 20:48   I have personally reviewed and evaluated these images as part of my medical decision-making.   MDM  30 y.o. 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 Stable for d/c without focal neuro deficits. Wrist splint, short course of steroids, muscle relaxant and f/u with ortho.   Final diagnoses:  Carpal tunnel syndrome of right wrist    Symptoms consistent with carpal tunnel. Pt given removable wrist splint. Will treat with prednisone and flexeril. No concern for radiculopathy. Cryotherapy, supportive therapy discussed. Follow up with Ortho. Return precautions discussed. Pt is safe for discharge at this time.     I personally performed the services described in this documentation, which was scribed in my presence. The recorded information has been reviewed and is accurate.    [facility] Bunnie Pion, [place] 02/08/16 1944

## 2016-02-07 NOTE — Discharge Instructions (Signed)

## 2016-02-07 NOTE — ED Notes (Signed)
Pt presents with Right hand/wrist swelling since last Tuesday. Pt complains of pain associated with hand shooting up her right arm. Pt states that she is taking ibuprofen without help.

## 2016-02-07 NOTE — ED Notes (Signed)
Pt verbalized understanding of d/c instructions and has no further questions. Pt stable and NAD.  

## 2016-02-18 IMAGING — US US OB COMP LESS 14 WK
1 series · 14 of 28 positions shown · non-contrast
Comparison: None.

CLINICAL DATA: Early pregnancy.  Spotting, pain.

EXAM:
OBSTETRIC <14 WK ULTRASOUND
TECHNIQUE: Transabdominal ultrasound was performed for evaluation of the
gestation as well as the maternal uterus and adnexal regions.

[Series 1: us ob comp less 14 wks · 14 of 52 slices shown]
[im 2/52]
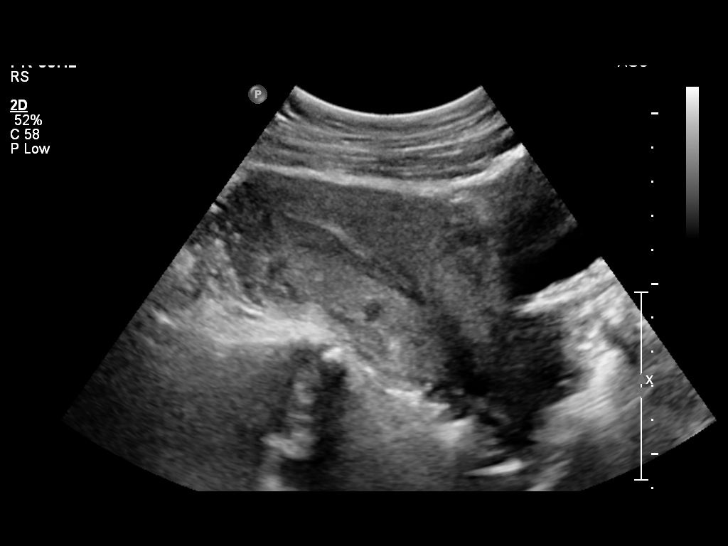
[im 6/52]
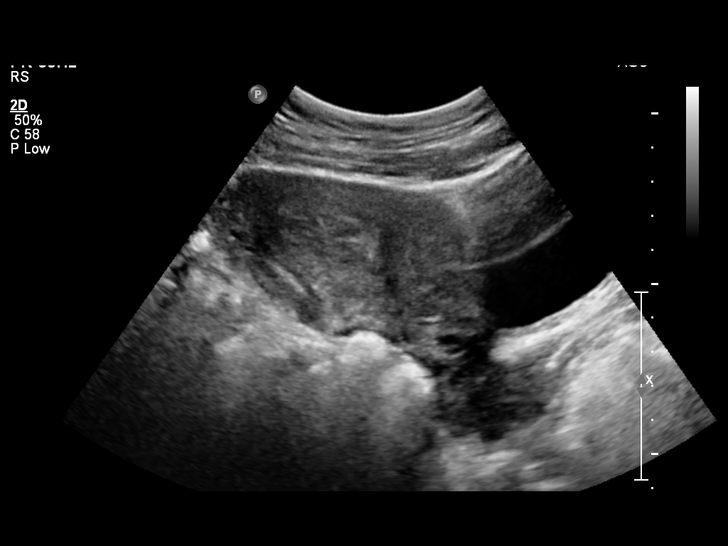
[im 10/52]
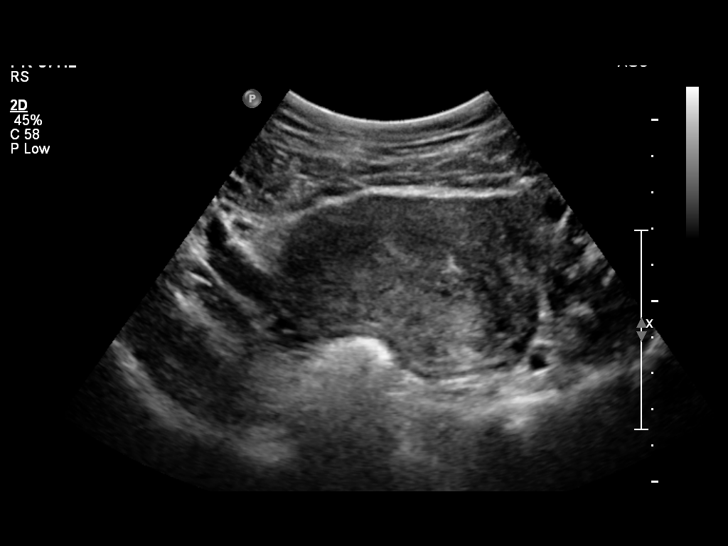
[im 14/52]
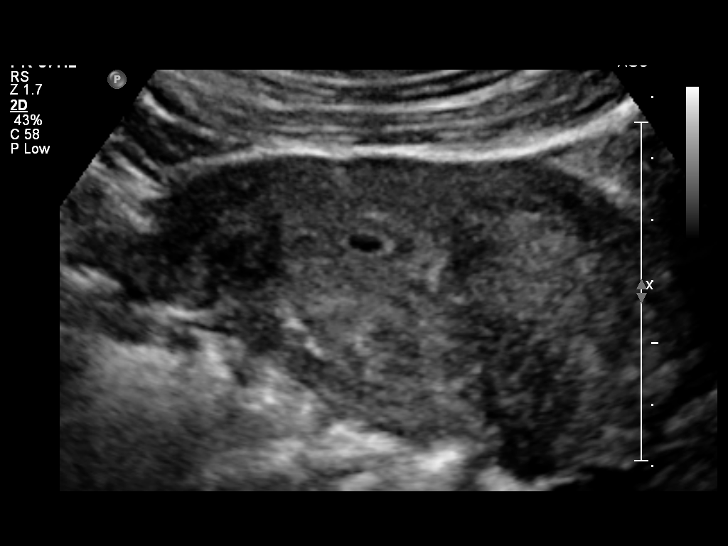
[im 18/52]
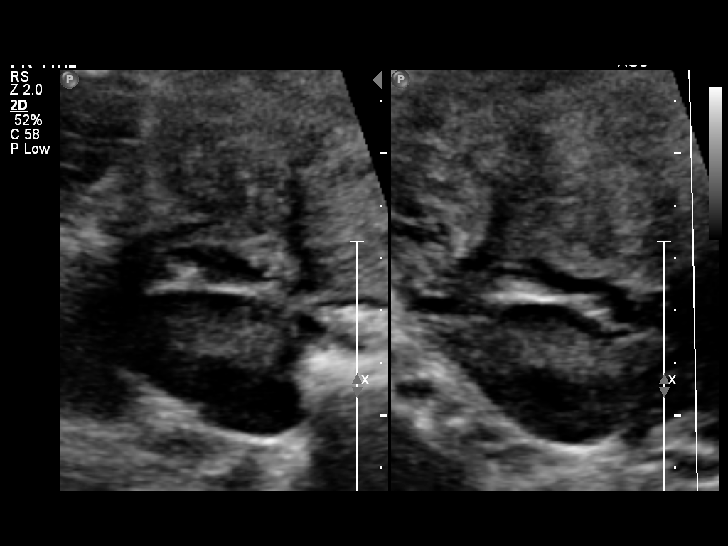
[im 21/52]
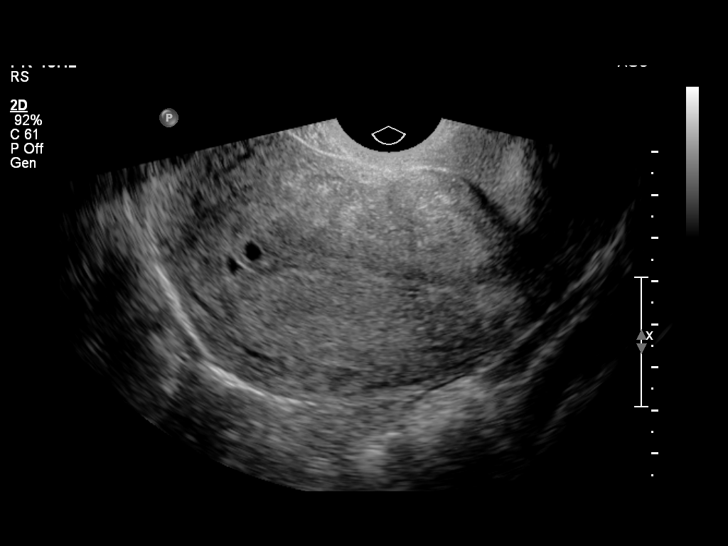
[im 25/52]
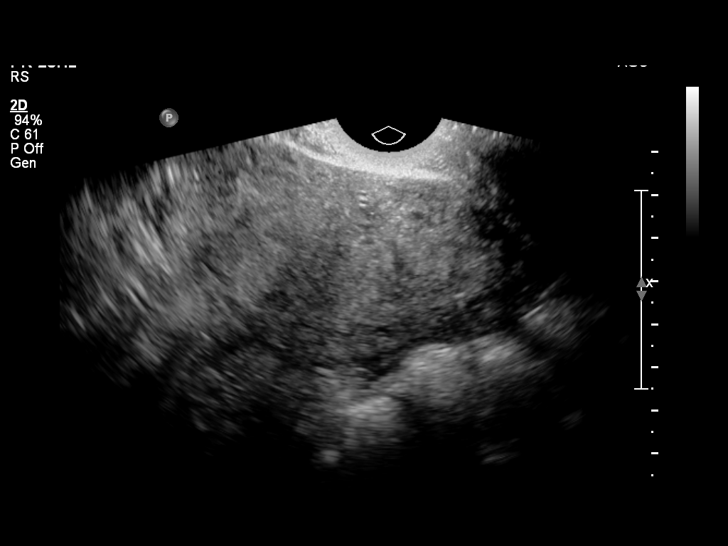
[im 29/52]
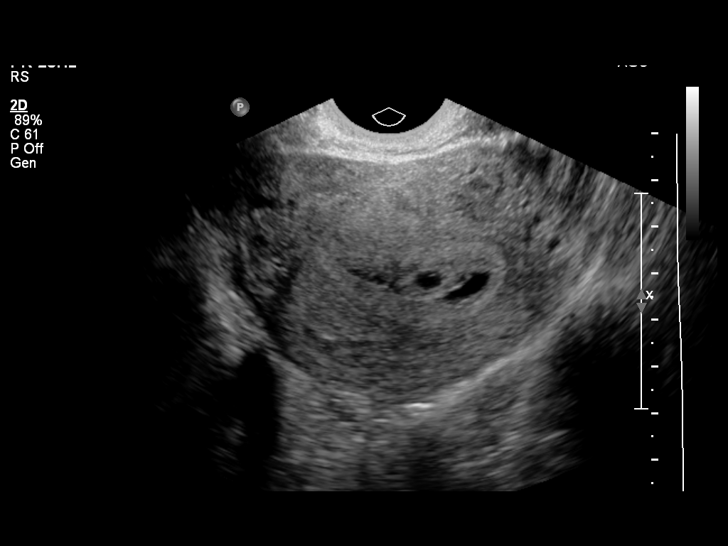
[im 33/52]
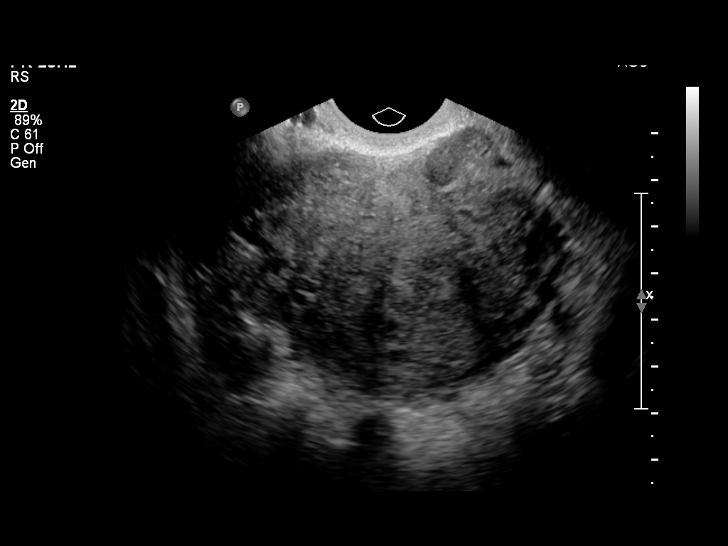
[im 36/52]
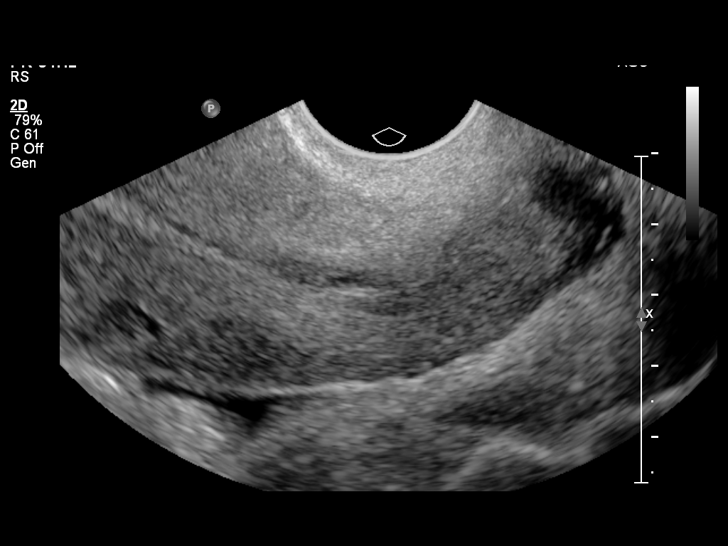
[im 40/52]
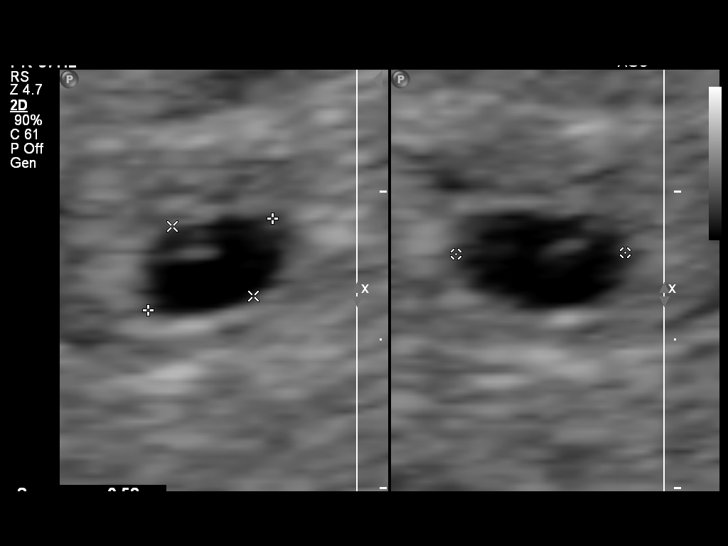
[im 44/52]
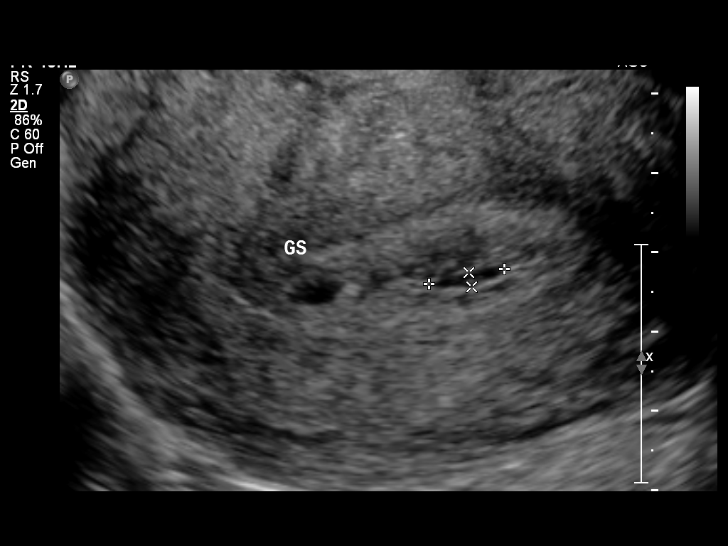
[im 48/52]
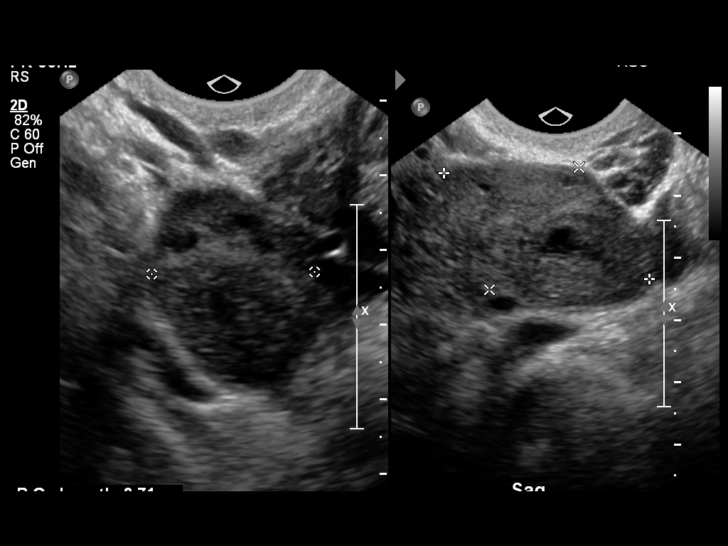
[im 52/52]
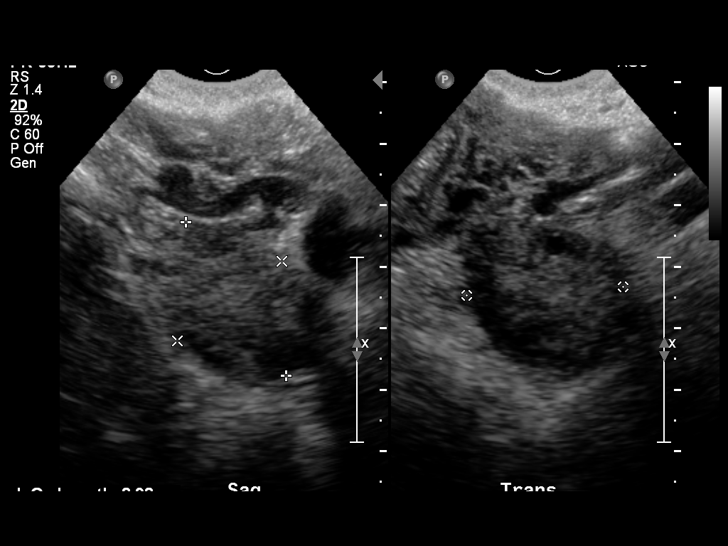

[14 of 28 positions shown; findings below may reference images not displayed]

FINDINGS: Intrauterine gestational sac: Visualized/normal in shape.

Yolk sac:  Visualized, indistinct

Embryo:  Not visualized

Cardiac Activity: Not visualized

Heart Rate:  bpm

MSD: 4.8  mm   5 w   0  d

CRL:     mm    w  d                  US EDC: 08/23/2014

Maternal uterus/adnexae: Small subchorionic hemorrhage. Ovaries are
normal. Right corpus luteum cyst. Small anterior intramural fibroid
measuring up to 1 cm.
IMPRESSION: Early intrauterine gestational sac with indistinct yolk sac. No
fetal pole currently. Small subchorionic hemorrhage. Recommend
followup ultrasound in 14 days to assure expected progression.

## 2016-04-18 NOTE — ED Provider Notes (Signed)
Medical screening examination/treatment/procedure(s) were performed by non-physician practitioner and as supervising physician I was immediately available for consultation/collaboration.   EKG Interpretation None       Isla Pence, MD 04/18/16 1054

## 2016-07-24 ENCOUNTER — Ambulatory Visit: Payer: Self-pay

## 2016-07-24 ENCOUNTER — Other Ambulatory Visit: Payer: Self-pay | Admitting: Occupational Medicine

## 2016-07-24 DIAGNOSIS — M79671 Pain in right foot: Secondary | ICD-10-CM

## 2017-03-18 ENCOUNTER — Encounter (HOSPITAL_COMMUNITY): Payer: Self-pay | Admitting: Emergency Medicine

## 2017-03-18 ENCOUNTER — Emergency Department (HOSPITAL_COMMUNITY)
Admission: EM | Admit: 2017-03-18 | Discharge: 2017-03-18 | Disposition: A | Discharge status: Home / Self Care | Payer: Medicaid Other | Attending: Emergency Medicine | Admitting: Emergency Medicine

## 2017-03-18 DIAGNOSIS — J029 Acute pharyngitis, unspecified: Secondary | ICD-10-CM | POA: Insufficient documentation

## 2017-03-18 DIAGNOSIS — J45909 Unspecified asthma, uncomplicated: Secondary | ICD-10-CM | POA: Insufficient documentation

## 2017-03-18 LAB — RAPID STREP SCREEN (MED CTR MEBANE ONLY): Streptococcus, Group A Screen (Direct): NEGATIVE

## 2017-03-18 NOTE — ED Provider Notes (Signed)
[facility] DEPT Provider Note   CSN: [id number] Arrival date & time: 03/18/17  1947  By signing my name below, I, Theresia Bough, attest that this documentation has been prepared under the direction and in the presence of non-physician practitioner, Janett Billow B. Alroy Dust, PA-C. Electronically Signed: Theresia Bough, ED Scribe. 03/18/17. 8:39 PM.  History   Chief Complaint Chief Complaint  Patient presents with  . Sore Throat   The history is provided by the patient. No language interpreter was used.   HPI Comments: Erika Lewis is a 31 y.o. female with a PMHx of asthma, who presents to the Emergency Department complaining of a mild sore throat onset this morning. 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 Pt reports associated headache. Pt states she has had possible contact with URI-like symptoms due to her job and her toddler who is in day care. Per pt, she has not used her inhaler in 2 years, denies intubation, and reports one hospitalization for asthma symptoms 15 years ago. No other complaints at this time.   Past Medical History:  Diagnosis Date  . Asthma   . Hx of varicella   . Sickle cell trait [facility])     Patient Active Problem List   Diagnosis Date Noted  . Asthma 08/25/2014  . Sickle cell trait ([place]) 08/25/2014  . Uterine leiomyoma 08/25/2014  . Vaginal delivery 08/25/2014  . Post-dates pregnancy 08/24/2014    History reviewed. No pertinent surgical history.  OB History    Gravida Para Term Preterm AB Living   5 2 2   2 1    SAB TAB Ectopic Multiple Live Births     2   0 1       Home Medications    Prior to Admission medications   Medication Sig Start Date End Date Taking? Authorizing Provider  acetaminophen (TYLENOL) 325 MG tablet Take 1 tablet (325 mg total) by mouth every 6 (six) hours as needed. 06/10/15   Gloriann Loan, PA-C  cyclobenzaprine (FLEXERIL) 10 MG tablet Take 1 tablet (10 mg total) by mouth 2 (two) times daily as  needed for muscle spasms. 02/07/16   Ashley Murrain, NP  guaiFENesin (MUCINEX) 600 MG 12 hr tablet Take 600 mg by mouth 2 (two) times daily.    [provider]  naproxen (NAPROSYN) 375 MG tablet Take 1 tablet (375 mg total) by mouth 2 (two) times daily. 06/08/15   Margarita Mail, PA-C  predniSONE (DELTASONE) 10 MG tablet Take 2 tablets (20 mg total) by mouth 2 (two) times daily with a meal. 02/07/16   Neese, [place], NP  traMADol (ULTRAM) 50 MG tablet Take 1 tablet (50 mg total) by mouth every 6 (six) hours as needed. 06/08/15   Margarita Mail, PA-C    Family History Family History  Problem Relation Age of Onset  . Hypertension Mother   . Diabetes Mother   . Sickle cell trait Mother   . Diabetes Maternal Grandmother   . Hypertension Maternal Grandmother   . Sickle cell trait Maternal Grandmother   . Sickle cell trait Sister   . Sickle cell trait Son   . Sickle cell trait Maternal Aunt     Social History Social History  Substance Use Topics  . Smoking status: Never Smoker  . Smokeless tobacco: Never Used  . Alcohol use No     Allergies   Patient has no known allergies.   Review of Systems Review of Systems  Constitutional: Negative for chills and fever.  HENT: Positive for sore throat. Negative for drooling, ear pain, facial swelling, trouble swallowing and voice change.   Eyes: Negative for pain and visual disturbance.  Respiratory: Negative for cough, choking, chest tightness, shortness of breath, wheezing and stridor.   Cardiovascular: Negative for chest pain and palpitations.  Gastrointestinal: Negative for abdominal pain, nausea and vomiting.  Genitourinary: Negative for difficulty urinating, dysuria and hematuria.  Musculoskeletal: Negative for arthralgias, back pain, joint swelling, myalgias, neck pain and neck stiffness.  Skin: Negative for color change, pallor and rash.  Neurological: Positive for headaches. Negative for dizziness, seizures, syncope, weakness and  light-headedness.     Physical Exam Updated Vital Signs BP 120/83 (BP Location: Left Arm)   Pulse 95   Temp 98.5 F (36.9 C) (Oral)   Resp 19   Ht 5\' 4"  (1.626 m)   LMP 03/11/2017 (Approximate)   SpO2 96%   Physical Exam  Constitutional: She is oriented to person, place, and time. She appears well-developed and well-nourished. No distress.  Patient is afebrile, non-toxic appearing, seated comfortably in chair in no acute distress.   HENT:  Head: Normocephalic and atraumatic.  Mouth/Throat: Posterior oropharyngeal erythema present. No oropharyngeal exudate.  No gross oral abscess. Uvula is midline, arches are simmetrical and intact. No peritonsilar swelling or exudate. No trismus. Sublingual mucosa is soft and non-tender. Tolerating oral secretions. No concern for ludwig's angina.  Eyes: Conjunctivae and EOM are normal. Right eye exhibits no discharge. Left eye exhibits no discharge.  Neck: Normal range of motion. Neck supple.  Cardiovascular: Normal rate, regular rhythm and normal heart sounds.  Exam reveals no gallop and no friction rub.   No murmur heard. Pulmonary/Chest: Effort normal and breath sounds normal. No respiratory distress. She has no wheezes. She has no rales.  Lymphadenopathy:    She has no cervical adenopathy.  Neurological: She is alert and oriented to person, place, and time.  Skin: Skin is warm and dry. She is not diaphoretic.  Psychiatric: She has a normal mood and affect.  Nursing note and vitals reviewed.    ED Treatments / Results  DIAGNOSTIC STUDIES: Oxygen Saturation is 96% on RA, normal by my interpretation.   COORDINATION OF CARE: 8:36 PM-Discussed next steps with pt including review of strep test and treatments with OTC medications. Pt verbalized understanding and is agreeable with the plan.   Labs (all labs ordered are listed, but only abnormal results are displayed) Labs Reviewed  RAPID STREP SCREEN (NOT AT [facility])  CULTURE, GROUP A STREP  [facility])    EKG  EKG Interpretation None       Radiology No results found.  Procedures Procedures (including critical care time)  Medications Ordered in ED Medications - No data to display   Initial Impression / Assessment and Plan / ED Course  I have reviewed the triage vital signs and the nursing notes.  Pertinent labs & imaging results that were available during my care of the patient were reviewed by me and considered in my medical decision making (see chart for details).    Pt symptoms consistent with viral pharyngitis and URI.  Rapid strep negative. Afebrile , non-toxic, reassuring exam.  Pt will be discharged with OTC symptomatic treatment. Pt is hemodynamically stable & in NAD prior to discharge. Follow up with PCP as needed.  Discussed strict return precautions and advised to return to the emergency department if experiencing any new or worsening symptoms. Instructions were understood and patient agreed  with discharge plan.  Final Clinical Impressions(s) / ED Diagnoses   Final diagnoses:  Viral pharyngitis    New Prescriptions New Prescriptions   No medications on file   I personally performed the services described in this documentation, which was scribed in my presence. The recorded information has been reviewed and is accurate.     Emeline General, PA-C 03/18/17 2157    Malvin Johns, MD 03/19/17 [phone]

## 2017-03-18 NOTE — ED Triage Notes (Signed)
Pt reports sore throat starting this am, denies fevers/chills. VSS. Afebrile in triage.

## 2017-03-18 NOTE — Discharge Instructions (Signed)
As discussed, drink tea with honey, cough drops, and/or throat spray over-the-counter to soothe your throat. Drink plenty of fluids and keep your urine clear. Tylenol and or ibuprofen for pain. Follow-up with your primary care provider as needed.  Return to the emergency department if she experienced difficulty breathing, swallowing, narrowing in your throat, or other new concerning symptoms in the meantime.

## 2017-03-18 NOTE — ED Notes (Signed)
Patient Alert and oriented X4. Stable and ambulatory. Patient verbalized understanding of the discharge instructions.  Patient belongings were taken by the patient.  

## 2017-03-21 LAB — CULTURE, GROUP A STREP (THRC)

## 2017-05-03 NOTE — Telephone Encounter (Signed)
See note

## 2017-09-09 ENCOUNTER — Encounter (HOSPITAL_COMMUNITY): Payer: Self-pay

## 2017-09-09 ENCOUNTER — Other Ambulatory Visit: Payer: Self-pay

## 2017-09-09 ENCOUNTER — Emergency Department (HOSPITAL_COMMUNITY): Payer: Medicaid Other

## 2017-09-09 ENCOUNTER — Emergency Department (HOSPITAL_COMMUNITY)
Admission: EM | Admit: 2017-09-09 | Discharge: 2017-09-09 | Disposition: A | Discharge status: Home / Self Care | Payer: Medicaid Other | Attending: Emergency Medicine | Admitting: Emergency Medicine

## 2017-09-09 DIAGNOSIS — R112 Nausea with vomiting, unspecified: Secondary | ICD-10-CM | POA: Diagnosis present

## 2017-09-09 DIAGNOSIS — R6889 Other general symptoms and signs: Secondary | ICD-10-CM

## 2017-09-09 DIAGNOSIS — D573 Sickle-cell trait: Secondary | ICD-10-CM | POA: Diagnosis not present

## 2017-09-09 DIAGNOSIS — J45901 Unspecified asthma with (acute) exacerbation: Secondary | ICD-10-CM | POA: Insufficient documentation

## 2017-09-09 DIAGNOSIS — J45909 Unspecified asthma, uncomplicated: Secondary | ICD-10-CM

## 2017-09-09 LAB — LIPASE, BLOOD: Lipase: 27 U/L (ref 11–51)

## 2017-09-09 LAB — COMPREHENSIVE METABOLIC PANEL
ALT: 16 U/L (ref 14–54)
AST: 18 U/L (ref 15–41)
Albumin: 3.5 g/dL (ref 3.5–5.0)
Alkaline Phosphatase: 37 U/L — ABNORMAL LOW (ref 38–126)
Anion gap: 6 (ref 5–15)
BUN: 7 mg/dL (ref 6–20)
CO2: 26 mmol/L (ref 22–32)
Calcium: 8.4 mg/dL — ABNORMAL LOW (ref 8.9–10.3)
Chloride: 106 mmol/L (ref 101–111)
Creatinine, Ser: 0.79 mg/dL (ref 0.44–1.00)
GFR calc Af Amer: >60 mL/min (ref >60)
GFR calc non Af Amer: >60 mL/min (ref >60)
Glucose, Bld: 86 mg/dL (ref 65–99)
Potassium: 3.7 mmol/L (ref 3.5–5.1)
Sodium: 138 mmol/L (ref 135–145)
Total Bilirubin: 0.2 mg/dL — ABNORMAL LOW (ref 0.3–1.2)
Total Protein: 6.6 g/dL (ref 6.5–8.1)

## 2017-09-09 LAB — URINALYSIS, ROUTINE W REFLEX MICROSCOPIC
Bilirubin Urine: NEGATIVE
Glucose, UA: NEGATIVE mg/dL
Ketones, ur: NEGATIVE mg/dL
Leukocytes, UA: NEGATIVE
Nitrite: NEGATIVE
Protein, ur: NEGATIVE mg/dL
Specific Gravity, Urine: 1.012 (ref 1.005–1.030)
pH: 6 (ref 5.0–8.0)

## 2017-09-09 LAB — CBC
HCT: 38 % (ref 36.0–46.0)
Hemoglobin: 13 g/dL (ref 12.0–15.0)
MCH: 28.6 pg (ref 26.0–34.0)
MCHC: 34.2 g/dL (ref 30.0–36.0)
MCV: 83.5 fL (ref 78.0–100.0)
Platelets: 217 10*3/uL (ref 150–400)
RBC: 4.55 MIL/uL (ref 3.87–5.11)
RDW: 14.4 % (ref 11.5–15.5)
WBC: 4 10*3/uL (ref 4.0–10.5)

## 2017-09-09 LAB — I-STAT BETA HCG BLOOD, ED (MC, WL, AP ONLY): I-stat hCG, quantitative: <5 m[IU]/mL (ref <5)

## 2017-09-09 MED ORDER — MUCINEX DM MAXIMUM STRENGTH 60-1200 MG PO TB12: 1.0000 | ORAL_TABLET | Freq: Two times a day (BID) | ORAL | 0 refills | Status: DC | PRN

## 2017-09-09 MED ORDER — ONDANSETRON HCL 4 MG/2ML IJ SOLN
4.0000 mg | Freq: Once | INTRAMUSCULAR | Status: AC
  Administered 2017-09-09: 4 mg via INTRAVENOUS
  Filled 2017-09-09: qty 2

## 2017-09-09 MED ORDER — METHYLPREDNISOLONE SODIUM SUCC 125 MG IJ SOLR
125.0000 mg | Freq: Once | INTRAMUSCULAR | Status: AC
  Administered 2017-09-09: 125 mg via INTRAVENOUS
  Filled 2017-09-09: qty 2

## 2017-09-09 MED ORDER — SODIUM CHLORIDE 0.9 % IV BOLUS (SEPSIS)
1000.0000 mL | Freq: Once | INTRAVENOUS | Status: AC
  Administered 2017-09-09: 1000 mL via INTRAVENOUS

## 2017-09-09 MED ORDER — IPRATROPIUM-ALBUTEROL 0.5-2.5 (3) MG/3ML IN SOLN
3.0000 mL | Freq: Once | RESPIRATORY_TRACT | Status: AC
  Administered 2017-09-09: 3 mL via RESPIRATORY_TRACT
  Filled 2017-09-09: qty 3

## 2017-09-09 MED ORDER — ALBUTEROL SULFATE HFA 108 (90 BASE) MCG/ACT IN AERS: 1.0000 | INHALATION_SPRAY | Freq: Four times a day (QID) | RESPIRATORY_TRACT | 0 refills | Status: AC | PRN

## 2017-09-09 MED ORDER — PREDNISONE 10 MG (21) PO TBPK: ORAL_TABLET | Freq: Every day | ORAL | 0 refills | Status: DC

## 2017-09-09 MED ORDER — KETOROLAC TROMETHAMINE 30 MG/ML IJ SOLN
15.0000 mg | Freq: Once | INTRAMUSCULAR | Status: AC
  Administered 2017-09-09: 15 mg via INTRAVENOUS
  Filled 2017-09-09: qty 1

## 2017-09-09 NOTE — ED Triage Notes (Signed)
Pt endorses cough, n/v, abd pain x 4 days. VSS.

## 2017-09-09 NOTE — ED Provider Notes (Signed)
[facility] EMERGENCY DEPARTMENT Provider Note   CSN: [id number] Arrival date & time: 09/09/17  1747     History   Chief Complaint Chief Complaint  Patient presents with  . Cough  . Emesis  . Abdominal Pain    HPI Erika Lewis is a 31 y.o. female who presents for 4 days of  Flu like sxs. The patient states that 09/06/2017 her sxs began with nausea and vomiting. She developed an associated cough and wheezing. The patient states that she has been able to hold down fluids but has not been able to eat any foods.  She has been using her inhaler more frequently.  She ran a fever up to 101 on Saturday.  She has been afebrile since.  She has no known contacts with similar symptoms.  She did have her flu vaccination this year.  HPI  Past Medical History:  Diagnosis Date  . Asthma   . Hx of varicella   . Sickle cell trait [facility])     Patient Active Problem List   Diagnosis Date Noted  . Asthma 08/25/2014  . Sickle cell trait ([place]) 08/25/2014  . Uterine leiomyoma 08/25/2014  . Vaginal delivery 08/25/2014  . Post-dates pregnancy 08/24/2014    History reviewed. No pertinent surgical history.  OB History    Gravida Para Term Preterm AB Living   5 2 2   2 1    SAB TAB Ectopic Multiple Live Births     2   0 1       Home Medications    Prior to Admission medications   Medication Sig Start Date End Date Taking? Authorizing Provider  acetaminophen (TYLENOL) 325 MG tablet Take 1 tablet (325 mg total) by mouth every 6 (six) hours as needed. Patient taking differently: Take 650 mg by mouth every 6 (six) hours as needed for fever or headache (pain).  06/10/15  Yes Gloriann Loan, PA-C  albuterol (PROVENTIL HFA;VENTOLIN HFA) 108 (90 Base) MCG/ACT inhaler Inhale 2 puffs into the lungs every 6 (six) hours as needed for wheezing or shortness of breath.   Yes [provider]  cyclobenzaprine (FLEXERIL) 10 MG tablet Take 1 tablet (10 mg total) by mouth 2 (two) times  daily as needed for muscle spasms. Patient not taking: Reported on 09/09/2017 02/07/16   Ashley Murrain, NP  naproxen (NAPROSYN) 375 MG tablet Take 1 tablet (375 mg total) by mouth 2 (two) times daily. Patient not taking: Reported on 09/09/2017 06/08/15   Margarita Mail, PA-C  traMADol (ULTRAM) 50 MG tablet Take 1 tablet (50 mg total) by mouth every 6 (six) hours as needed. Patient not taking: Reported on 09/09/2017 06/08/15   Margarita Mail, PA-C    Family History Family History  Problem Relation Age of Onset  . Hypertension Mother   . Diabetes Mother   . Sickle cell trait Mother   . Diabetes Maternal Grandmother   . Hypertension Maternal Grandmother   . Sickle cell trait Maternal Grandmother   . Sickle cell trait Sister   . Sickle cell trait Son   . Sickle cell trait Maternal Aunt     Social History Social History   Tobacco Use  . Smoking status: Never Smoker  . Smokeless tobacco: Never Used  Substance Use Topics  . Alcohol use: No  . Drug use: No     Allergies   Patient has no known allergies.   Review of Systems Review of Systems Ten systems reviewed and are negative  for acute change, except as noted in the HPI.    Physical Exam Updated Vital Signs BP 128/69   Pulse 73   Temp 98.6 F (37 C) (Oral)   Resp 16   Ht 5\' 4"  (1.626 m)   Wt 82.1 kg (181 lb)   LMP 09/06/2017 (Exact Date)   SpO2 98%   Breastfeeding? No   BMI 31.07 kg/m   Physical Exam  Constitutional: She is oriented to person, place, and time. She appears well-developed and well-nourished. She appears ill. No distress.  HENT:  Head: Normocephalic and atraumatic.  Eyes: Conjunctivae and EOM are normal. Pupils are equal, round, and reactive to light. No scleral icterus.  Neck: Normal range of motion.  Cardiovascular: Normal rate, regular rhythm and normal heart sounds. Exam reveals no gallop and no friction rub.  No murmur heard. Pulmonary/Chest: Effort normal. No respiratory distress. She has  wheezes.  Abdominal: Soft. Bowel sounds are normal. She exhibits no distension and no mass. There is no tenderness. There is no guarding.  Neurological: She is alert and oriented to person, place, and time.  Skin: Skin is warm and dry. She is not diaphoretic.  Psychiatric: Her behavior is normal.  Nursing note and vitals reviewed.    ED Treatments / Results  Labs (all labs ordered are listed, but only abnormal results are displayed) Labs Reviewed  COMPREHENSIVE METABOLIC PANEL - Abnormal; Notable for the following components:      Result Value   Calcium 8.4 (*)    Alkaline Phosphatase 37 (*)    Total Bilirubin 0.2 (*)    All other components within normal limits  URINALYSIS, ROUTINE W REFLEX MICROSCOPIC - Abnormal; Notable for the following components:   Hgb urine dipstick LARGE (*)    Bacteria, UA RARE (*)    Squamous Epithelial / LPF 0-5 (*)    All other components within normal limits  LIPASE, BLOOD  CBC  I-STAT BETA HCG BLOOD, ED ([facility], [facility], [facility] ONLY)    EKG  EKG Interpretation None       Radiology No results found.  Procedures Procedures (including critical care time)  Medications Ordered in ED Medications  ipratropium-albuterol (DUONEB) 0.5-2.5 (3) MG/3ML nebulizer solution 3 mL (not administered)  sodium chloride 0.9 % bolus 1,000 mL (not administered)  ondansetron (ZOFRAN) injection 4 mg (not administered)  methylPREDNISolone sodium succinate (SOLU-MEDROL) 125 mg/2 mL injection 125 mg (not administered)  ketorolac (TORADOL) 30 MG/ML injection 15 mg (not administered)     Initial Impression / Assessment and Plan / ED Course  I have reviewed the triage vital signs and the nursing notes.  Pertinent labs & imaging results that were available during my care of the patient were reviewed by me and considered in my medical decision making (see chart for details).     Patient with flu like sxs. Blood in urine, but she is on her period.  She feels improved after  fluids and meds. Will d.c with symptomatic treatment.  Final Clinical Impressions(s) / ED Diagnoses   Final diagnoses:  Flu-like symptoms  Asthmatic bronchitis without complication, unspecified asthma severity, unspecified whether persistent    ED Discharge Orders    None       Margarita Mail, PA-C 09/09/17 2329    Virgel Manifold, MD 09/10/17 1929

## 2017-09-09 NOTE — Discharge Instructions (Signed)
Get help right away if: °You have severe or persistent: °Headache. °Ear pain. °Sinus pain. °Chest pain. °You have chronic lung disease and any of the following: °Wheezing. °Prolonged cough. °Coughing up blood. °A change in your usual mucus. °You have a stiff neck. °You have changes in your: °Vision. °Hearing. °Thinking. °Mood. °

## 2017-09-09 NOTE — ED Notes (Signed)
Patient transported to X-ray 

## 2019-03-25 ENCOUNTER — Other Ambulatory Visit: Payer: Self-pay | Admitting: *Deleted

## 2019-03-25 DIAGNOSIS — Z20822 Contact with and (suspected) exposure to covid-19: Secondary | ICD-10-CM

## 2019-03-29 LAB — NOVEL CORONAVIRUS, NAA: SARS-CoV-2, NAA: DETECTED — AB

## 2019-03-30 ENCOUNTER — Encounter (HOSPITAL_COMMUNITY): Payer: Self-pay

## 2019-03-30 ENCOUNTER — Other Ambulatory Visit: Payer: Self-pay

## 2019-03-30 ENCOUNTER — Telehealth: Payer: Self-pay | Admitting: Critical Care Medicine

## 2019-03-30 ENCOUNTER — Encounter: Payer: Self-pay | Admitting: Critical Care Medicine

## 2019-03-30 ENCOUNTER — Emergency Department (HOSPITAL_COMMUNITY)
Admission: EM | Admit: 2019-03-30 | Discharge: 2019-03-31 | Disposition: A | Discharge status: Home / Self Care | Payer: Self-pay | Attending: Emergency Medicine | Admitting: Emergency Medicine

## 2019-03-30 DIAGNOSIS — E669 Obesity, unspecified: Secondary | ICD-10-CM | POA: Insufficient documentation

## 2019-03-30 DIAGNOSIS — R87612 Low grade squamous intraepithelial lesion on cytologic smear of cervix (LGSIL): Secondary | ICD-10-CM | POA: Insufficient documentation

## 2019-03-30 DIAGNOSIS — R509 Fever, unspecified: Secondary | ICD-10-CM | POA: Insufficient documentation

## 2019-03-30 DIAGNOSIS — Z79899 Other long term (current) drug therapy: Secondary | ICD-10-CM | POA: Insufficient documentation

## 2019-03-30 DIAGNOSIS — E876 Hypokalemia: Secondary | ICD-10-CM | POA: Insufficient documentation

## 2019-03-30 DIAGNOSIS — J189 Pneumonia, unspecified organism: Secondary | ICD-10-CM | POA: Insufficient documentation

## 2019-03-30 DIAGNOSIS — U071 COVID-19: Secondary | ICD-10-CM | POA: Insufficient documentation

## 2019-03-30 DIAGNOSIS — J45909 Unspecified asthma, uncomplicated: Secondary | ICD-10-CM | POA: Insufficient documentation

## 2019-03-30 DIAGNOSIS — J129 Viral pneumonia, unspecified: Secondary | ICD-10-CM

## 2019-03-30 DIAGNOSIS — D649 Anemia, unspecified: Secondary | ICD-10-CM

## 2019-03-30 HISTORY — DX: Anemia, unspecified: D64.9

## 2019-03-30 HISTORY — DX: Low grade squamous intraepithelial lesion on cytologic smear of cervix (LGSIL): R87.612

## 2019-03-30 NOTE — Telephone Encounter (Signed)
I connected with this patient and she is symptomatic with cough fever and some mild shortness of breath.  She has headaches and dizziness as well.  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

## 2019-03-30 NOTE — Telephone Encounter (Signed)
-----   Message from Carlisle Beers, RN sent at 03/29/2019 11:04 AM EDT ----- Routing to Dr Joya Gaskins and Dr Doreene Burke

## 2019-03-31 ENCOUNTER — Emergency Department (HOSPITAL_COMMUNITY): Payer: Medicaid Other

## 2019-03-31 LAB — COMPREHENSIVE METABOLIC PANEL
ALT: 16 U/L (ref 0–44)
AST: 16 U/L (ref 15–41)
Albumin: 3.5 g/dL (ref 3.5–5.0)
Alkaline Phosphatase: 35 U/L — ABNORMAL LOW (ref 38–126)
Anion gap: 8 (ref 5–15)
BUN: 5 mg/dL — ABNORMAL LOW (ref 6–20)
CO2: 20 mmol/L — ABNORMAL LOW (ref 22–32)
Calcium: 8.4 mg/dL — ABNORMAL LOW (ref 8.9–10.3)
Chloride: 108 mmol/L (ref 98–111)
Creatinine, Ser: 0.66 mg/dL (ref 0.44–1.00)
GFR calc Af Amer: >60 mL/min (ref >60)
GFR calc non Af Amer: >60 mL/min (ref >60)
Glucose, Bld: 127 mg/dL — ABNORMAL HIGH (ref 70–99)
Potassium: 3.3 mmol/L — ABNORMAL LOW (ref 3.5–5.1)
Sodium: 136 mmol/L (ref 135–145)
Total Bilirubin: 0.4 mg/dL (ref 0.3–1.2)
Total Protein: 6.5 g/dL (ref 6.5–8.1)

## 2019-03-31 LAB — CBC WITH DIFFERENTIAL/PLATELET
Abs Immature Granulocytes: 0.02 10*3/uL (ref 0.00–0.07)
Basophils Absolute: 0 10*3/uL (ref 0.0–0.1)
Basophils Relative: 0 %
Eosinophils Absolute: 0 10*3/uL (ref 0.0–0.5)
Eosinophils Relative: 0 %
HCT: 37.2 % (ref 36.0–46.0)
Hemoglobin: 12.5 g/dL (ref 12.0–15.0)
Immature Granulocytes: 0 %
Lymphocytes Relative: 22 %
Lymphs Abs: 1.2 10*3/uL (ref 0.7–4.0)
MCH: 27.8 pg (ref 26.0–34.0)
MCHC: 33.6 g/dL (ref 30.0–36.0)
MCV: 82.7 fL (ref 80.0–100.0)
Monocytes Absolute: 0.4 10*3/uL (ref 0.1–1.0)
Monocytes Relative: 7 %
Neutro Abs: 3.9 10*3/uL (ref 1.7–7.7)
Neutrophils Relative %: 71 %
Platelets: 193 10*3/uL (ref 150–400)
RBC: 4.5 MIL/uL (ref 3.87–5.11)
RDW: 14.5 % (ref 11.5–15.5)
WBC: 5.5 10*3/uL (ref 4.0–10.5)
nRBC: 0 % (ref 0.0–0.2)

## 2019-03-31 LAB — LACTIC ACID, PLASMA: Lactic Acid, Venous: 0.9 mmol/L (ref 0.5–1.9)

## 2019-03-31 LAB — HCG, QUANTITATIVE, PREGNANCY: hCG, Beta Chain, Quant, S: <1 m[IU]/mL (ref <5)

## 2019-03-31 MED ORDER — SODIUM CHLORIDE 0.9 % IV BOLUS
500.0000 mL | Freq: Once | INTRAVENOUS | Status: AC
  Administered 2019-03-31: 500 mL via INTRAVENOUS

## 2019-03-31 MED ORDER — IPRATROPIUM BROMIDE HFA 17 MCG/ACT IN AERS
3.0000 | INHALATION_SPRAY | Freq: Once | RESPIRATORY_TRACT | Status: AC
  Administered 2019-03-31: 05:00:00 3 via RESPIRATORY_TRACT
  Filled 2019-03-31: qty 12.9

## 2019-03-31 MED ORDER — SODIUM CHLORIDE 0.9 % IV BOLUS: 1000.0000 mL | Freq: Once | INTRAVENOUS | Status: DC

## 2019-03-31 MED ORDER — ALBUTEROL SULFATE HFA 108 (90 BASE) MCG/ACT IN AERS
6.0000 | INHALATION_SPRAY | RESPIRATORY_TRACT | Status: DC | PRN
  Administered 2019-03-31: 05:00:00 6 via RESPIRATORY_TRACT
  Filled 2019-03-31: qty 6.7

## 2019-03-31 MED ORDER — ALBUTEROL SULFATE HFA 108 (90 BASE) MCG/ACT IN AERS
INHALATION_SPRAY | RESPIRATORY_TRACT | Status: AC
  Filled 2019-03-31: qty 6.7

## 2019-03-31 MED ORDER — POTASSIUM CHLORIDE CRYS ER 20 MEQ PO TBCR
40.0000 meq | EXTENDED_RELEASE_TABLET | Freq: Once | ORAL | Status: AC
Start: 2019-03-31 — End: 2019-03-31
  Administered 2019-03-31: 05:00:00 40 meq via ORAL
  Filled 2019-03-31: qty 2

## 2019-03-31 MED ORDER — ACETAMINOPHEN 500 MG PO TABS
1000.0000 mg | ORAL_TABLET | Freq: Once | ORAL | Status: AC
Start: 2019-03-31 — End: 2019-03-31
  Administered 2019-03-31: 1000 mg via ORAL
  Filled 2019-03-31: qty 2

## 2019-03-31 NOTE — Discharge Instructions (Addendum)
1. Medications: albuterol (up to 8 puffs every 2 hours) and atrovent MDI (up to 3 puffs every 2 hours), tylenol 1000mg  every 6 hours for fever, usual home medications 2. Treatment: rest, drink plenty of fluids,  3. Follow Up: Please followup with your primary doctor in 2-3 days for repeat chest x-ray, discussion of your diagnoses and further evaluation after today's visit; if you do not have a primary care doctor use the resource guide provided to find one; Please return to the ER if symptoms worsen including worsening shortness of breath, uncontrolled fever, persistent vomiting or other concerns

## 2019-03-31 NOTE — ED Notes (Signed)
Pt was able to maintain O2 sats above 95 while ambulating around room. Only complaints was patient felt dizzy and became tachycardic.

## 2019-03-31 NOTE — ED Provider Notes (Signed)
[place] EMERGENCY DEPARTMENT Provider Note   CSN: [id number] Arrival date & time: 03/30/19  2213    History   Chief Complaint Chief Complaint  Patient presents with  . Shortness of Breath    HPI Erika Lewis is a 33 y.o. female with a hx of asthma, sickle cell trait presents to the Emergency Department complaining of gradual, persistent, progressively worsening fever, fatigue, cough onset 6 days ago.  Patient reports that on Wednesday she was tested for COVID and it came back positive.  She reports she is had difficulty controlling her fevers at home despite alternating Tylenol and Motrin.  She reports using her home albuterol MDI without relief.  She states severe fatigue, severe headaches, fever and chills along with shortness of breath and cough.  She has had some nausea but no significant abdominal pain.  No vomiting or diarrhea.  Patient reports feeling generally awful.  Patient reports fever does improve some with medication but does not resolve.  No aggravating factors.     The history is provided by the patient and medical records. No language interpreter was used.    Past Medical History:  Diagnosis Date  . Asthma   . Hx of varicella   . Sickle cell trait [facility])     Patient Active Problem List   Diagnosis Date Noted  . Low grade squamous intraepithelial lesion (LGSIL) on cervicovaginal cytologic smear 03/30/2019  . Anemia 03/30/2019  . Obesity with body mass index 30 or greater 03/30/2019  . Asthma 08/25/2014  . Sickle cell trait ([place]) 08/25/2014  . Uterine leiomyoma 08/25/2014  . Vaginal delivery 08/25/2014  . Post-dates pregnancy 08/24/2014    History reviewed. No pertinent surgical history.   OB History    Gravida  5   Para  2   Term  2   Preterm      AB  2   Living  1     SAB      TAB  2   Ectopic      Multiple  0   Live Births  1            Home Medications    Prior to Admission medications    Medication Sig Start Date End Date Taking? Authorizing Provider  acetaminophen (TYLENOL) 325 MG tablet Take 1 tablet (325 mg total) by mouth every 6 (six) hours as needed. Patient taking differently: Take 650 mg by mouth every 6 (six) hours as needed for fever or headache (pain).  06/10/15  Yes Gloriann Loan, PA-C  albuterol (PROVENTIL HFA;VENTOLIN HFA) 108 (90 Base) MCG/ACT inhaler Inhale 1-2 puffs into the lungs every 6 (six) hours as needed for wheezing or shortness of breath. 09/09/17  Yes Harris, Abigail, PA-C  ibuprofen (ADVIL) 200 MG tablet Take 200 mg by mouth every 6 (six) hours as needed for fever.   Yes [provider]  cyclobenzaprine (FLEXERIL) 10 MG tablet Take 1 tablet (10 mg total) by mouth 2 (two) times daily as needed for muscle spasms. Patient not taking: Reported on 09/09/2017 02/07/16   Ashley Murrain, NP  Dextromethorphan-Guaifenesin [facility] DM MAXIMUM STRENGTH) 60-1200 MG TB12 Take 1 tablet by mouth every 12 (twelve) hours as needed (cough). Patient not taking: Reported on 03/31/2019 09/09/17   Margarita Mail, PA-C  naproxen (NAPROSYN) 375 MG tablet Take 1 tablet (375 mg total) by mouth 2 (two) times daily. Patient not taking: Reported on 09/09/2017 06/08/15   Margarita Mail, PA-C  predniSONE [facility]  UNI-PAK 21 TAB) 10 MG (21) TBPK tablet Take by mouth daily. Take 6 tabs by mouth daily  for 2 days, then 5 tabs for 2 days, then 4 tabs for 2 days, then 3 tabs for 2 days, 2 tabs for 2 days, then 1 tab by mouth daily for 2 days Patient not taking: Reported on 03/31/2019 09/09/17   Margarita Mail, PA-C  traMADol (ULTRAM) 50 MG tablet Take 1 tablet (50 mg total) by mouth every 6 (six) hours as needed. Patient not taking: Reported on 09/09/2017 06/08/15   Margarita Mail, PA-C    Family History Family History  Problem Relation Age of Onset  . Hypertension Mother   . Diabetes Mother   . Sickle cell trait Mother   . Diabetes Maternal Grandmother   . Hypertension Maternal  Grandmother   . Sickle cell trait Maternal Grandmother   . Sickle cell trait Sister   . Sickle cell trait Son   . Sickle cell trait Maternal Aunt     Social History Social History   Tobacco Use  . Smoking status: Never Smoker  . Smokeless tobacco: Never Used  Substance Use Topics  . Alcohol use: No  . Drug use: No     Allergies   Patient has no known allergies.   Review of Systems Review of Systems  Constitutional: Positive for chills, fatigue and fever. Negative for appetite change, diaphoresis and unexpected weight change.  HENT: Negative for mouth sores.   Eyes: Negative for visual disturbance.  Respiratory: Positive for cough and shortness of breath. Negative for chest tightness and wheezing.   Cardiovascular: Negative for chest pain.  Gastrointestinal: Positive for abdominal pain, diarrhea and nausea. Negative for constipation and vomiting.  Endocrine: Negative for polydipsia, polyphagia and polyuria.  Genitourinary: Negative for dysuria, frequency, hematuria and urgency.  Musculoskeletal: Negative for back pain, neck pain and neck stiffness.  Skin: Negative for rash.  Allergic/Immunologic: Negative for immunocompromised state.  Neurological: Positive for headaches. Negative for syncope and light-headedness.  Hematological: Does not bruise/bleed easily.  Psychiatric/Behavioral: Negative for sleep disturbance. The patient is not nervous/anxious.      Physical Exam Updated Vital Signs BP 111/80   Pulse 93   Temp (!) 100.4 F (38 C) (Oral)   Resp (!) 23   Ht 5\' 4"  (1.626 m)   Wt 90.7 kg   LMP 02/23/2019   SpO2 97%   BMI 34.33 kg/m   Physical Exam Vitals signs and nursing note reviewed.  Constitutional:      General: She is not in acute distress.    Appearance: She is not diaphoretic.  HENT:     Head: Normocephalic.  Eyes:     General: No scleral icterus.    Conjunctiva/sclera: Conjunctivae normal.  Neck:     Musculoskeletal: Normal range of motion.   Cardiovascular:     Rate and Rhythm: Regular rhythm. Tachycardia present.     Pulses: Normal pulses.          Radial pulses are 2+ on the right side and 2+ on the left side.  Pulmonary:     Effort: Tachypnea present. No accessory muscle usage, prolonged expiration, respiratory distress or retractions.     Breath sounds: No stridor. Wheezing ( faint expiratory in all fields) present. No rhonchi.     Comments: Equal chest rise. No increased work of breathing. Abdominal:     General: There is no distension.     Palpations: Abdomen is soft.     Tenderness: There  is no abdominal tenderness. There is no guarding or rebound.  Musculoskeletal:     Comments: Moves all extremities equally and without difficulty.  Skin:    General: Skin is warm and dry.     Capillary Refill: Capillary refill takes less than 2 seconds.  Neurological:     Mental Status: She is alert.     GCS: GCS eye subscore is 4. GCS verbal subscore is 5. GCS motor subscore is 6.     Comments: Speech is clear and goal oriented.  Psychiatric:        Mood and Affect: Mood normal.      ED Treatments / Results  Labs (all labs ordered are listed, but only abnormal results are displayed) Labs Reviewed  COMPREHENSIVE METABOLIC PANEL - Abnormal; Notable for the following components:      Result Value   Potassium 3.3 (*)    CO2 20 (*)    Glucose, Bld 127 (*)    BUN 5 (*)    Calcium 8.4 (*)    Alkaline Phosphatase 35 (*)    All other components within normal limits  CULTURE, BLOOD (ROUTINE X 2)  CULTURE, BLOOD (ROUTINE X 2)  LACTIC ACID, PLASMA  CBC WITH DIFFERENTIAL/PLATELET  HCG, QUANTITATIVE, PREGNANCY  LACTIC ACID, PLASMA  URINALYSIS, ROUTINE W REFLEX MICROSCOPIC    Radiology Dg Chest Port 1 View  Result Date: 03/31/2019 CLINICAL DATA:  Fever and shortness of breath EXAM: PORTABLE CHEST 1 VIEW COMPARISON:  September 09, 2017 FINDINGS: The lung volumes are low. The heart size is mildly enlarged. There are  bibasilar airspace opacities, right worse than left. There is no pneumothorax. No acute osseous abnormality. IMPRESSION: 1. Low lung volumes with bibasilar airspace opacities which may represent atelectasis or developing infiltrate. 2. Mild cardiac enlargement, similar to prior study. Electronically Signed   By: Constance Holster M.D.   On: 03/31/2019 01:34    Procedures Procedures (including critical care time)  Medications Ordered in ED Medications  albuterol (VENTOLIN HFA) 108 (90 Base) MCG/ACT inhaler 6 puff (6 puffs Inhalation Given 03/31/19 0439)  acetaminophen (TYLENOL) tablet 1,000 mg (1,000 mg Oral Given 03/31/19 0030)  sodium chloride 0.9 % bolus 500 mL (0 mLs Intravenous Stopped 03/31/19 0301)  ipratropium (ATROVENT HFA) inhaler 3 puff (3 puffs Inhalation Given 03/31/19 0501)  potassium chloride SA (K-DUR) CR tablet 40 mEq (40 mEq Oral Given 03/31/19 0439)     Initial Impression / Assessment and Plan / ED Course  I have reviewed the triage vital signs and the nursing notes.  Pertinent labs & imaging results that were available during my care of the patient were reviewed by me and considered in my medical decision making (see chart for details).  Clinical Course as of Mar 30 518  Tue Mar 31, 2019  0101 febrile  Temp(!): 103.1 F (39.5 C) [HM]  0101 tachycardic  Pulse Rate(!): 111 [HM]  0101 tachypnea  Resp(!): 40 [HM]  0413 Hypokalemia - potassium replaced in the ED  Potassium(!): 3.3 [HM]  0413 WNL  WBC: 5.5 [HM]  0413 Pt vitals are improving.  No longer tachycardic.  Pt remains without hypoxia.  Tachypnea is improving.   Pulse Rate: 90 [HM]  0518 Reports she is feeling much better.  Her vitals have improved significantly.  She has not been hypoxic at any time throughout her emergency department visit.   [HM]    Clinical Course User Index [HM] Hiroshi Krummel, Jarrett Soho, PA-C       Seabron Spates  was evaluated in Emergency Department on 03/31/2019 for the symptoms  described in the history of present illness. She was evaluated in the context of the global COVID-19 pandemic, which necessitated consideration that the patient might be at risk for infection with the SARS-CoV-2 virus that causes COVID-19. Institutional protocols and algorithms that pertain to the evaluation of patients at risk for COVID-19 are in a state of rapid change based on information released by regulatory bodies including the CDC and federal and state organizations. These policies and algorithms were followed during the patient's care in the ED.   Patient presents to the emergency department with a previous diagnosis of COVID.  She is tachycardic, febrile and tachypneic.  She does have a history of asthma and reports albuterol at home is not working.  Albuterol MDI given.  No hypoxia on exam.  Patient given small volume of fluids.  Her labs are reassuring.  No leukocytosis, no elevation in her enzymes.  Mild hypokalemia replaced here in the emergency department.  No anemia.  Patient's vital signs have normalized.  She reports she is feeling much better.  She is breathing easier.  She is without hypoxia.  At this time there is not an indication for admission.  Long discussion with patient about progression of this disease and potential for admission later symptoms worsen.  Discussed all the reasons to return immediately to the emergency department.  Did discuss usage of albuterol and Atrovent MDIs at home.  Also discussed bibasilar atelectasis versus viral pneumonia.  Given COVID positive, suspect viral pneumonia.  Discussed need for repeat chest x-ray in several days.  Patient states understanding and is in agreement with this plan.   Final Clinical Impressions(s) / ED Diagnoses   Final diagnoses:  COVID-19 virus infection  Fever, unspecified fever cause  Viral pneumonia  Hypokalemia    ED Discharge Orders    None       Loni Muse Gwenlyn Perking 03/31/19 0522    Orpah Greek, MD 03/31/19 [phone]

## 2019-03-31 NOTE — ED Notes (Signed)
1st LA 0.9

## 2019-04-05 LAB — CULTURE, BLOOD (ROUTINE X 2)
Culture: NO GROWTH
Culture: NO GROWTH
Special Requests: ADEQUATE
Special Requests: ADEQUATE

## 2019-04-06 ENCOUNTER — Telehealth: Payer: Self-pay | Admitting: *Deleted

## 2019-04-06 ENCOUNTER — Encounter: Payer: Self-pay | Admitting: Critical Care Medicine

## 2019-04-06 ENCOUNTER — Ambulatory Visit: Payer: Self-pay

## 2019-04-06 ENCOUNTER — Telehealth (HOSPITAL_BASED_OUTPATIENT_CLINIC_OR_DEPARTMENT_OTHER): Payer: Medicaid Other | Admitting: Critical Care Medicine

## 2019-04-06 ENCOUNTER — Other Ambulatory Visit: Payer: Self-pay

## 2019-04-06 DIAGNOSIS — Z20822 Contact with and (suspected) exposure to covid-19: Secondary | ICD-10-CM

## 2019-04-06 DIAGNOSIS — J1289 Other viral pneumonia: Secondary | ICD-10-CM

## 2019-04-06 DIAGNOSIS — U071 COVID-19: Secondary | ICD-10-CM

## 2019-04-06 DIAGNOSIS — J1282 Pneumonia due to coronavirus disease 2019: Secondary | ICD-10-CM

## 2019-04-06 NOTE — Progress Notes (Signed)
Patient ID: Erika Lewis, female   DOB: 03-22-1986, 33 y.o.   MRN: [id number]  Virtual Visit via Video Note  I connected with@ on 04/06/19 at@ by a video enabled telemedicine application and verified that I am speaking with the correct person using two identifiers.   Consent:  I discussed the limitations, risks, security and privacy concerns of performing an evaluation and management service by video visit and the availability of in person appointments. I also discussed with the patient that there may be a patient responsible charge related to this service. The patient expressed understanding and agreed to proceed.  Location of patient: The patient was at home Location of provider: I was in my office Persons participating in the televisit with the patient.   Patient was by herself    History of Present Illness: This is a 33 year old female who was seen by way of a video visit and follow-up of COVID infection The patient had visited a testing event on June 26 in the community the test came back positive and I spoke to the patient on June 29.  The patient subsequently went to the emergency room later that day.  Documentation of the emergency room visit is as below.  Erika Lewis is a 33 y.o. female with a hx of asthma, sickle cell trait presents to the Emergency Department complaining of gradual, persistent, progressively worsening fever, fatigue, cough onset 6 days ago.  Patient reports that on Wednesday she was tested for COVID and it came back positive.  She reports she is had difficulty controlling her fevers at home despite alternating Tylenol and Motrin.  She reports using her home albuterol MDI without relief.  She states severe fatigue, severe headaches, fever and chills along with shortness of breath and cough.  She has had some nausea but no significant abdominal pain.  No vomiting or diarrhea.  Patient reports feeling generally awful.  Patient reports fever does improve some with  medication but does not resolve.  No aggravating factors  Note they documented hypokalemia and a temperature of 103 degrees but normal saturations.  The patient was given IV fluids antipyretics and sent home.  Since that time the patient notes resolution of headaches and chills.  The patient has remained in isolation.  She has minimal shortness of breath.  There is no cough or mucus production.  She does have a history of asthma and is been using her Qvar inhaler and albuterol as needed.  She denies any chest pain.  She has an incentive spirometer and is up to 1500 cc daily.  She works as a Chief Executive Officer at [employer].  Her employer know she does have positive COVID.  Apparently a staff member there was positive and then other patients became positive in this patient became infected.  She reports there was inadequate PPE at the workplace.  Again the employer know she is out of work and is positive.  She is been keeping fluids in.  There are no GI symptoms as well.  She states she has not had any fever for 3 days in a row and has not taken antipyretics. Observations/Objective: Dg Chest Port 1 View  Result Date: 03/31/2019 CLINICAL DATA:  Fever and shortness of breath EXAM: PORTABLE CHEST 1 VIEW COMPARISON:  September 09, 2017 FINDINGS: The lung volumes are low. The heart size is mildly enlarged. There are bibasilar airspace opacities, right worse than left. There is no pneumothorax. No acute osseous abnormality. IMPRESSION: 1. Low  lung volumes with bibasilar airspace opacities which may represent atelectasis or developing infiltrate. 2. Mild cardiac enlargement, similar to prior study.      Assessment and Plan: #1 COVID-19 infection with evidence of mild pneumonitis at the bases of the lung that is rapidly improving.  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  No other changes in the patient's plan of care made at this time   Follow Up Instructions: The patient knows she will receive a call from the testing center for an appointment for testing   I discussed the assessment and treatment plan with the patient. The patient was provided an opportunity to ask questions and all were answered. The patient agreed with the plan and demonstrated an understanding of the instructions.   The patient was advised to call back or seek an in-person evaluation if the symptoms worsen or if the condition fails to improve as anticipated.  I provided 7minutes of non-face-to-face time during this encounter  including  median intraservice time , review of notes, labs, imaging, medications  and explaining diagnosis and management to the patient .    Asencion Noble, MD

## 2019-04-06 NOTE — Telephone Encounter (Signed)
COVID 19 scheduled for Thursday per pt. Request.

## 2019-04-06 NOTE — Telephone Encounter (Signed)
-----   Message from Erika Stain, MD sent at 04/06/2019  8:50 AM EDT ----- Regarding: Needs Covid test Please schedule Covid test for Wednesday July 9th    Patient is previously positive, needs repeat testing  Has Medicaid [place]  Dob [birth year]

## 2019-04-06 NOTE — Telephone Encounter (Signed)
Left voicemail for patient to return call to set up repeat COVID 19 test.  Test ordered.

## 2019-04-06 NOTE — Telephone Encounter (Signed)
See telephone note.

## 2019-04-09 ENCOUNTER — Other Ambulatory Visit: Payer: Self-pay

## 2019-04-09 DIAGNOSIS — Z20822 Contact with and (suspected) exposure to covid-19: Secondary | ICD-10-CM

## 2019-04-13 LAB — NOVEL CORONAVIRUS, NAA: SARS-CoV-2, NAA: NOT DETECTED

## 2019-08-14 ENCOUNTER — Ambulatory Visit (HOSPITAL_COMMUNITY)
Admission: EM | Admit: 2019-08-14 | Discharge: 2019-08-14 | Discharge status: Against Medical Advice | Payer: Medicaid Other

## 2021-05-28 IMAGING — DX PORTABLE CHEST - 1 VIEW
1 series · 1 of 1 positions shown · non-contrast
Comparison: September 09, 2017

CLINICAL DATA: Fever and shortness of breath

EXAM:
PORTABLE CHEST 1 VIEW

[chest ap]
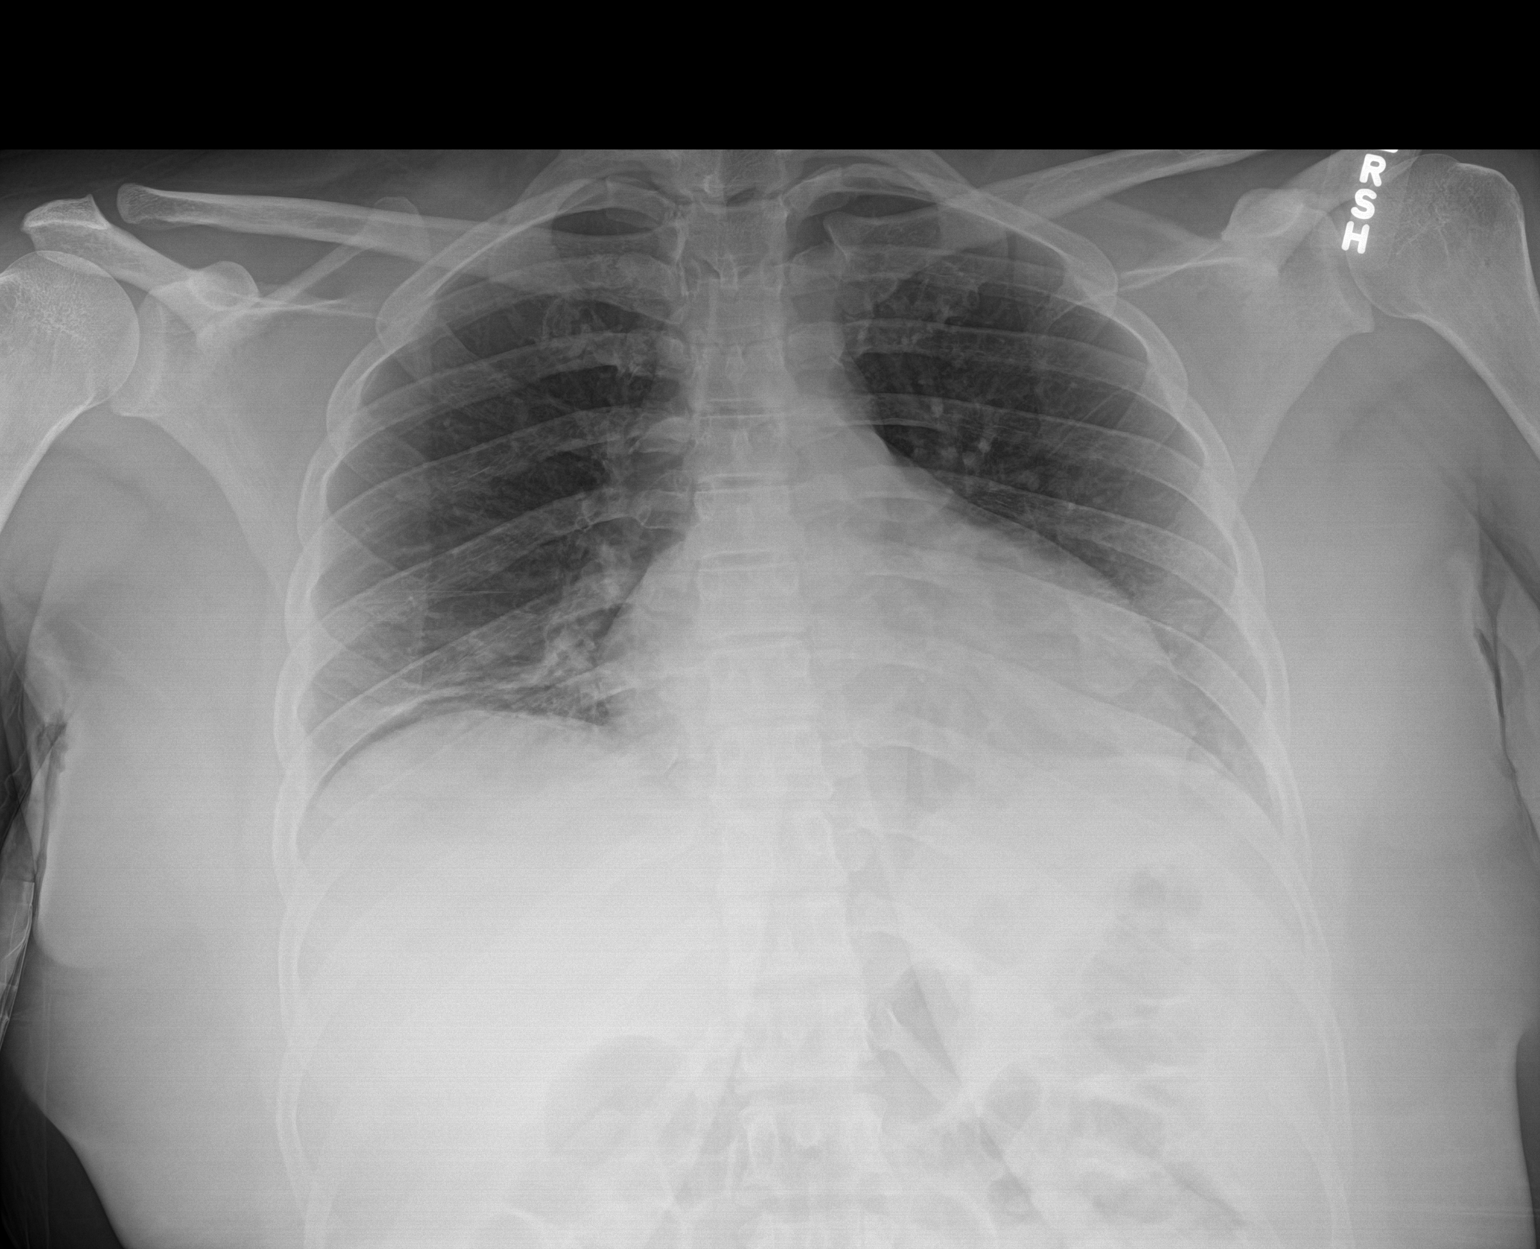

[1 of 1 positions shown; findings below may reference images not displayed]

FINDINGS: The lung volumes are low. The heart size is mildly enlarged. There
are bibasilar airspace opacities, right worse than left. There is no
pneumothorax. No acute osseous abnormality.
IMPRESSION: 1. Low lung volumes with bibasilar airspace opacities which may
represent atelectasis or developing infiltrate.
2. Mild cardiac enlargement, similar to prior study.

## 2022-11-23 ENCOUNTER — Ambulatory Visit (HOSPITAL_COMMUNITY)
Admission: EM | Admit: 2022-11-23 | Discharge: 2022-11-23 | Disposition: A | Discharge status: Home / Self Care | Payer: Medicaid Other | Attending: Emergency Medicine | Admitting: Emergency Medicine

## 2022-11-23 ENCOUNTER — Encounter (HOSPITAL_COMMUNITY): Payer: Self-pay

## 2022-11-23 DIAGNOSIS — Z3201 Encounter for pregnancy test, result positive: Secondary | ICD-10-CM | POA: Diagnosis not present

## 2022-11-23 LAB — POC URINE PREG, ED: Preg Test, Ur: POSITIVE — AB

## 2022-11-23 NOTE — ED Triage Notes (Signed)
Here for urine pregnancy test. Pt reports + home pregnancy test. Pt c/o nausea at this time.

## 2022-11-23 NOTE — Discharge Instructions (Signed)
Please follow with your ob/gyn for further evaluation including ultrasound

## 2022-11-23 NOTE — ED Provider Notes (Signed)
[facility]    CSN: [id number] Arrival date & time: 11/23/22  1504      History   Chief Complaint Chief Complaint  Patient presents with   Possible Pregnancy    HPI Erika Lewis is a 37 y.o. female.  Here for confirmation of pregnancy Had home test that was positive LMP 1/20 Some nausea, no vomiting.  Denies abdominal pain, cramping, bleeding or spotting  Past Medical History:  Diagnosis Date   Asthma    Hx of varicella    Sickle cell trait [facility])     Patient Active Problem List   Diagnosis Date Noted   Low grade squamous intraepithelial lesion (LGSIL) on cervicovaginal cytologic smear 03/30/2019   Anemia 03/30/2019   Obesity with body mass index 30 or greater 03/30/2019   Asthma 08/25/2014   Sickle cell trait ([place]) 08/25/2014   Uterine leiomyoma 08/25/2014   Vaginal delivery 08/25/2014   Post-dates pregnancy 08/24/2014    History reviewed. No pertinent surgical history.  OB History     Gravida  5   Para  2   Term  2   Preterm      AB  2   Living  1      SAB      IAB  2   Ectopic      Multiple  0   Live Births  1            Home Medications    Prior to Admission medications   Medication Sig Start Date End Date Taking? Authorizing Provider  albuterol (PROVENTIL HFA;VENTOLIN HFA) 108 (90 Base) MCG/ACT inhaler Inhale 1-2 puffs into the lungs every 6 (six) hours as needed for wheezing or shortness of breath. 09/09/17   Margarita Mail, PA-C    Family History Family History  Problem Relation Age of Onset   Hypertension Mother    Diabetes Mother    Sickle cell trait Mother    Diabetes Maternal Grandmother    Hypertension Maternal Grandmother    Sickle cell trait Maternal Grandmother    Sickle cell trait Sister    Sickle cell trait Son    Sickle cell trait Maternal Aunt     Social History Social History   Tobacco Use   Smoking status: Never   Smokeless tobacco: Never  Vaping Use   Vaping Use: Never used   Substance Use Topics   Alcohol use: No   Drug use: No     Allergies   Patient has no known allergies.   Review of Systems Review of Systems As per HPI  Physical Exam Triage Vital Signs ED Triage Vitals  Enc Vitals Group     BP 11/23/22 1613 (!) 141/79     Pulse Rate 11/23/22 1613 (!) 103     Resp 11/23/22 1613 20     Temp 11/23/22 1613 98.6 F (37 C)     Temp Source 11/23/22 1613 Oral     SpO2 11/23/22 1613 99 %     Weight --      Height --      Head Circumference --      Peak Flow --      Pain Score 11/23/22 1614 0     Pain Loc --      Pain Edu? --      Excl. in [place]? --    No data found.  Updated Vital Signs BP (!) 141/79 (BP Location: Left Arm)   Pulse (!) 103  Temp 98.6 F (37 C) (Oral)   Resp 20   LMP 10/20/2022   SpO2 99%   Visual Acuity Right Eye Distance:   Left Eye Distance:   Bilateral Distance:    Right Eye Near:   Left Eye Near:    Bilateral Near:     Physical Exam Vitals and nursing note reviewed.  Constitutional:      General: She is not in acute distress.    Appearance: Normal appearance.  HENT:     Mouth/Throat:     Pharynx: Oropharynx is clear.  Cardiovascular:     Rate and Rhythm: Normal rate and regular rhythm.     Pulses: Normal pulses.  Pulmonary:     Effort: Pulmonary effort is normal.  Abdominal:     Tenderness: There is no abdominal tenderness.  Neurological:     Mental Status: She is alert and oriented to person, place, and time.      UC Treatments / Results  Labs (all labs ordered are listed, but only abnormal results are displayed) Labs Reviewed  POC URINE PREG, ED - Abnormal; Notable for the following components:      Result Value   Preg Test, Ur POSITIVE (*)    All other components within normal limits    EKG   Radiology No results found.  Procedures Procedures (including critical care time)  Medications Ordered in UC Medications - No data to display  Initial Impression / Assessment and Plan  / UC Course  I have reviewed the triage vital signs and the nursing notes.  Pertinent labs & imaging results that were available during my care of the patient were reviewed by me and considered in my medical decision making (see chart for details).  UPT positive Discussed follow up with ob/gyn No questions or concerns today  Final Clinical Impressions(s) / UC Diagnoses   Final diagnoses:  Positive pregnancy test     Discharge Instructions      Please follow with your ob/gyn for further evaluation including ultrasound     ED Prescriptions   None    PDMP not reviewed this encounter.   Les Pou, [place] 11/23/22 [phone]

## 2022-12-06 ENCOUNTER — Ambulatory Visit (HOSPITAL_COMMUNITY): Payer: Self-pay

## 2022-12-06 ENCOUNTER — Telehealth (HOSPITAL_COMMUNITY): Payer: Self-pay | Admitting: *Deleted

## 2022-12-06 NOTE — Telephone Encounter (Signed)
Pt on UC schedule for abd pain and is pregnant. Called LMOM to call back she should be advised to go to MAU.

## 2022-12-08 ENCOUNTER — Inpatient Hospital Stay (HOSPITAL_COMMUNITY): Payer: Medicaid Other

## 2022-12-08 ENCOUNTER — Encounter (HOSPITAL_COMMUNITY): Payer: Self-pay | Admitting: *Deleted

## 2022-12-08 ENCOUNTER — Inpatient Hospital Stay (HOSPITAL_COMMUNITY)
Admission: AD | Admit: 2022-12-08 | Discharge: 2022-12-08 | Disposition: A | Discharge status: Home / Self Care | Payer: Medicaid Other | Attending: Obstetrics and Gynecology | Admitting: Obstetrics and Gynecology

## 2022-12-08 DIAGNOSIS — R109 Unspecified abdominal pain: Secondary | ICD-10-CM

## 2022-12-08 DIAGNOSIS — Z3A01 Less than 8 weeks gestation of pregnancy: Secondary | ICD-10-CM | POA: Insufficient documentation

## 2022-12-08 DIAGNOSIS — O30041 Twin pregnancy, dichorionic/diamniotic, first trimester: Secondary | ICD-10-CM | POA: Diagnosis present

## 2022-12-08 DIAGNOSIS — O26891 Other specified pregnancy related conditions, first trimester: Secondary | ICD-10-CM | POA: Insufficient documentation

## 2022-12-08 DIAGNOSIS — O208 Other hemorrhage in early pregnancy: Secondary | ICD-10-CM | POA: Diagnosis not present

## 2022-12-08 DIAGNOSIS — D259 Leiomyoma of uterus, unspecified: Secondary | ICD-10-CM

## 2022-12-08 DIAGNOSIS — O09521 Supervision of elderly multigravida, first trimester: Secondary | ICD-10-CM | POA: Insufficient documentation

## 2022-12-08 LAB — CBC
HCT: 34.6 % — ABNORMAL LOW (ref 36.0–46.0)
Hemoglobin: 11.6 g/dL — ABNORMAL LOW (ref 12.0–15.0)
MCH: 26.6 pg (ref 26.0–34.0)
MCHC: 33.5 g/dL (ref 30.0–36.0)
MCV: 79.4 fL — ABNORMAL LOW (ref 80.0–100.0)
Platelets: 266 10*3/uL (ref 150–400)
RBC: 4.36 MIL/uL (ref 3.87–5.11)
RDW: 14.7 % (ref 11.5–15.5)
WBC: 10.3 10*3/uL (ref 4.0–10.5)
nRBC: 0 % (ref 0.0–0.2)

## 2022-12-08 LAB — URINALYSIS, ROUTINE W REFLEX MICROSCOPIC
Bilirubin Urine: NEGATIVE
Glucose, UA: NEGATIVE mg/dL
Hgb urine dipstick: NEGATIVE
Ketones, ur: NEGATIVE mg/dL
Leukocytes,Ua: NEGATIVE
Nitrite: NEGATIVE
Protein, ur: NEGATIVE mg/dL
Specific Gravity, Urine: 1.013 (ref 1.005–1.030)
pH: 5 (ref 5.0–8.0)

## 2022-12-08 LAB — HCG, QUANTITATIVE, PREGNANCY: hCG, Beta Chain, Quant, S: 160782 m[IU]/mL — ABNORMAL HIGH (ref <5)

## 2022-12-08 LAB — WET PREP, GENITAL
Clue Cells Wet Prep HPF POC: NONE SEEN
Sperm: NONE SEEN
Trich, Wet Prep: NONE SEEN
WBC, Wet Prep HPF POC: <10 (ref <10)
Yeast Wet Prep HPF POC: NONE SEEN
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
## 2022-12-08 NOTE — Discharge Instructions (Signed)

## 2022-12-08 NOTE — MAU Provider Note (Addendum)
History     CSN: [id number]  Arrival date and time: 12/08/22 1335   Event Date/Time   First Provider Initiated Contact with Patient 12/08/22 1631      Chief Complaint  Patient presents with   Abdominal Pain   HPI Erika Lewis is 37 y.o. [id number] at 62w0dwho presents to MAU with chief complaint of left mid-abdominal pain. This is a new problem, onset when patient's 460year old niece jumped onto her abdomen. Pain radiates to her LLQ. Pain score is 5/10. She denies aggravating or alleviating factors. She has not taken medication for this complaint. She denies vaginal bleeding, abdominal tenderness, dysuria, fever or recent illness.  OB History     Gravida  6   Para  2   Term  2   Preterm      AB  2   Living  2      SAB      IAB  2   Ectopic      Multiple  0   Live Births  2           Past Medical History:  Diagnosis Date   Asthma    Hx of varicella    Sickle cell trait (HCC)     Past Surgical History:  Procedure Laterality Date   NO PAST SURGERIES      Family History  Problem Relation Age of Onset   Hypertension Mother    Diabetes Mother    Sickle cell trait Mother    Diabetes Maternal Grandmother    Hypertension Maternal Grandmother    Sickle cell trait Maternal Grandmother    Sickle cell trait Sister    Sickle cell trait Son    Sickle cell trait Maternal Aunt     Social History   Tobacco Use   Smoking status: Never   Smokeless tobacco: Never  Vaping Use   Vaping Use: Never used  Substance Use Topics   Alcohol use: No   Drug use: No    Allergies: No Known Allergies  No medications prior to admission.    Review of Systems  Gastrointestinal:  Positive for abdominal pain.  All other systems reviewed and are negative.  Physical Exam   Blood pressure 127/73, pulse 81, temperature 98.8 F (37.1 C), resp. rate 18, last menstrual period 10/20/2022.  Physical Exam Vitals and nursing note reviewed. Exam conducted with a  chaperone present.  Constitutional:      Appearance: She is well-developed. She is obese. She is not ill-appearing.  Cardiovascular:     Rate and Rhythm: Normal rate.  Pulmonary:     Effort: Pulmonary effort is normal.  Abdominal:     General: Abdomen is flat.     Palpations: Abdomen is soft.     Tenderness: There is no abdominal tenderness.  Skin:    Capillary Refill: Capillary refill takes less than 2 seconds.  Neurological:     Mental Status: She is alert and oriented to person, place, and time.  Psychiatric:        Mood and Affect: Mood normal.        Behavior: Behavior normal.     MAU Course  Procedures  MDM Orders Placed This Encounter  Procedures   Wet prep, genital   [place] Comp Less 14 Wks   [place] Comp AddL Gest Less 14 Wks   Urinalysis, Routine w reflex microscopic -Urine, Clean Catch   CBC   hCG, quantitative, pregnancy   Nursing  communication   Discharge patient   Patient Vitals for the past 24 hrs:  BP Temp Pulse Resp  12/08/22 1646 -- -- -- 18  12/08/22 1352 127/73 98.8 F (37.1 C) 81 18   Results for orders placed or performed during the hospital encounter of 12/08/22 (from the past 24 hour(s))  Urinalysis, Routine w reflex microscopic -Urine, Clean Catch     Status: Abnormal   Collection Time: 12/08/22  2:12 PM  Result Value Ref Range   Color, Urine YELLOW YELLOW   APPearance HAZY (A) CLEAR   Specific Gravity, Urine 1.013 1.005 - 1.030   pH 5.0 5.0 - 8.0   Glucose, UA NEGATIVE NEGATIVE mg/dL   Hgb urine dipstick NEGATIVE NEGATIVE   Bilirubin Urine NEGATIVE NEGATIVE   Ketones, ur NEGATIVE NEGATIVE mg/dL   Protein, ur NEGATIVE NEGATIVE mg/dL   Nitrite NEGATIVE NEGATIVE   Leukocytes,Ua NEGATIVE NEGATIVE   RBC / HPF 0-5 0 - 5 RBC/hpf   WBC, UA 0-5 0 - 5 WBC/hpf   Bacteria, UA RARE (A) NONE SEEN   Squamous Epithelial / HPF 0-5 0 - 5 /HPF   Mucus PRESENT   CBC     Status: Abnormal   Collection Time: 12/08/22  2:33 PM  Result Value Ref Range    WBC 10.3 4.0 - 10.5 K/uL   RBC 4.36 3.87 - 5.11 MIL/uL   Hemoglobin 11.6 (L) 12.0 - 15.0 g/dL   HCT 34.6 (L) 36.0 - 46.0 %   MCV 79.4 (L) 80.0 - 100.0 fL   MCH 26.6 26.0 - 34.0 pg   MCHC 33.5 30.0 - 36.0 g/dL   RDW 14.7 11.5 - 15.5 %   Platelets 266 150 - 400 K/uL   nRBC 0.0 0.0 - 0.2 %  hCG, quantitative, pregnancy     Status: Abnormal   Collection Time: 12/08/22  2:33 PM  Result Value Ref Range   hCG, Beta Chain, Quant, S 160,782 (H) <5 mIU/mL  Wet prep, genital     Status: None   Collection Time: 12/08/22  2:39 PM   Specimen: Vaginal  Result Value Ref Range   Yeast Wet Prep HPF POC NONE SEEN NONE SEEN   Trich, Wet Prep NONE SEEN NONE SEEN   Clue Cells Wet Prep HPF POC NONE SEEN NONE SEEN   WBC, Wet Prep HPF POC <10 <10   Sperm NONE SEEN    US OB Comp Less 14 Wks  Result Date: 12/08/2022 CLINICAL DATA:  Abdominal cramping during pregnancy. EXAM: TWIN OBSTETRICAL ULTRASOUND <14 WKS TECHNIQUE: Transabdominal ultrasound was performed for evaluation of the gestation as well as the maternal uterus and adnexal regions. COMPARISON:  None Available. FINDINGS: Number of IUPs:  2 Chorionicity/Amnionicity:  Dichorionic-diamniotic (thick membrane) TWIN 1 Yolk sac:  Visualized. Embryo:  Visualized. Cardiac Activity: Visualized. Heart Rate: 137 bpm CRL:   10 mm   7 w 1  d                  [place] EDC: 07/26/2023 TWIN 2 Yolk sac:  Visualized. Embryo:  Visualized. Cardiac Activity: Visualized. Heart Rate: 138 bpm CRL:   11 mm   7 w 1 d                  [place] EDC: 07/26/2023 Subchorionic hemorrhage: There is a moderate-sized subchorionic hemorrhage measuring 4.3 x 3.1 x 2.0 cm. Maternal uterus/adnexae: Bilateral ovaries are visualized and within normal limits. Corpus luteum is noted in the right ovary. Uterine fibroids  are identified as follows: Posterior right measuring 2.7 cm, posterior midline measuring 2.8 cm, right anterior measuring 2.2 cm. Questionable submucosal fibroid identified measuring 1.6 cm. There  is no free fluid in the pelvis. IMPRESSION: 1. Live intrauterine twin pregnancy, diamniotic dichorionic, measuring 7 weeks 1 day by crown-rump length. 2. Moderate-sized subchorionic hemorrhage. 3. Multiple uterine fibroids including questionable small submucosal fibroid. Electronically Signed   By: Ronney Asters M.D.   On: 12/08/2022 16:28   US OB Comp AddL Gest Less 14 Wks  Result Date: 12/08/2022 CLINICAL DATA:  Abdominal cramping during pregnancy. EXAM: TWIN OBSTETRICAL ULTRASOUND <14 WKS TECHNIQUE: Transabdominal ultrasound was performed for evaluation of the gestation as well as the maternal uterus and adnexal regions. COMPARISON:  None Available. FINDINGS: Number of IUPs:  2 Chorionicity/Amnionicity:  Dichorionic-diamniotic (thick membrane) TWIN 1 Yolk sac:  Visualized. Embryo:  Visualized. Cardiac Activity: Visualized. Heart Rate: 137 bpm CRL:   10 mm   7 w 1  d                  [place] EDC: 07/26/2023 TWIN 2 Yolk sac:  Visualized. Embryo:  Visualized. Cardiac Activity: Visualized. Heart Rate: 138 bpm CRL:   11 mm   7 w 1 d                  [place] EDC: 07/26/2023 Subchorionic hemorrhage: There is a moderate-sized subchorionic hemorrhage measuring 4.3 x 3.1 x 2.0 cm. Maternal uterus/adnexae: Bilateral ovaries are visualized and within normal limits. Corpus luteum is noted in the right ovary. Uterine fibroids are identified as follows: Posterior right measuring 2.7 cm, posterior midline measuring 2.8 cm, right anterior measuring 2.2 cm. Questionable submucosal fibroid identified measuring 1.6 cm. There is no free fluid in the pelvis. IMPRESSION: 1. Live intrauterine twin pregnancy, diamniotic dichorionic, measuring 7 weeks 1 day by crown-rump length. 2. Moderate-sized subchorionic hemorrhage. 3. Multiple uterine fibroids including questionable small submucosal fibroid. Electronically Signed   By: Ronney Asters M.D.   On: 12/08/2022 16:28    Assessment and Plan  --37 y.o. [id number] with di/di twin gestation, dating  c/w LMP --Moderate subchorionic hemorrhage, pelvic rest advised --Discharge home in stable condition with first trimester precautions  F/U: --Patient to schedule care with her desired [facility] Provider  Darlina Rumpf, Dora, MSN, CNM 12/08/2022, 6:10 PM   I provided general supervision for this patient and was immediately available for any patient care concern. [employer], DO

## 2022-12-10 LAB — GC/CHLAMYDIA PROBE AMP (~~LOC~~) NOT AT ARMC
Chlamydia: NEGATIVE
Comment: NEGATIVE
Comment: NORMAL
Neisseria Gonorrhea: NEGATIVE

## 2022-12-19 DIAGNOSIS — O30009 Twin pregnancy, unspecified number of placenta and unspecified number of amniotic sacs, unspecified trimester: Secondary | ICD-10-CM | POA: Insufficient documentation

## 2022-12-19 DIAGNOSIS — O418X90 Other specified disorders of amniotic fluid and membranes, unspecified trimester, not applicable or unspecified: Secondary | ICD-10-CM

## 2022-12-19 DIAGNOSIS — Z3490 Encounter for supervision of normal pregnancy, unspecified, unspecified trimester: Secondary | ICD-10-CM | POA: Insufficient documentation

## 2022-12-19 HISTORY — DX: Encounter for supervision of normal pregnancy, unspecified, unspecified trimester: Z34.90

## 2022-12-19 HISTORY — DX: Other antepartum hemorrhage, unspecified trimester: O41.8X90

## 2022-12-23 ENCOUNTER — Other Ambulatory Visit: Payer: Self-pay

## 2022-12-23 ENCOUNTER — Inpatient Hospital Stay (HOSPITAL_COMMUNITY)
Admission: AD | Admit: 2022-12-23 | Discharge: 2022-12-23 | Disposition: A | Discharge status: Home / Self Care | Payer: Medicaid Other | Attending: Obstetrics and Gynecology | Admitting: Obstetrics and Gynecology

## 2022-12-23 ENCOUNTER — Encounter (HOSPITAL_COMMUNITY): Payer: Self-pay | Admitting: Obstetrics and Gynecology

## 2022-12-23 DIAGNOSIS — O10919 Unspecified pre-existing hypertension complicating pregnancy, unspecified trimester: Secondary | ICD-10-CM

## 2022-12-23 DIAGNOSIS — O10911 Unspecified pre-existing hypertension complicating pregnancy, first trimester: Secondary | ICD-10-CM

## 2022-12-23 DIAGNOSIS — R519 Headache, unspecified: Secondary | ICD-10-CM

## 2022-12-23 DIAGNOSIS — Z3A09 9 weeks gestation of pregnancy: Secondary | ICD-10-CM

## 2022-12-23 DIAGNOSIS — O26891 Other specified pregnancy related conditions, first trimester: Secondary | ICD-10-CM | POA: Diagnosis present

## 2022-12-23 DIAGNOSIS — Z79899 Other long term (current) drug therapy: Secondary | ICD-10-CM | POA: Diagnosis not present

## 2022-12-23 MED ORDER — ACETAMINOPHEN-CAFFEINE 500-65 MG PO TABS
2.0000 | ORAL_TABLET | Freq: Once | ORAL | Status: AC
  Administered 2022-12-23: 2 via ORAL
  Filled 2022-12-23: qty 2

## 2022-12-23 MED ORDER — CYCLOBENZAPRINE HCL 5 MG PO TABS
10.0000 mg | ORAL_TABLET | Freq: Once | ORAL | Status: AC
  Administered 2022-12-23: 10 mg via ORAL
  Filled 2022-12-23: qty 2

## 2022-12-23 MED ORDER — METOCLOPRAMIDE HCL 10 MG PO TABS: 10.0000 mg | ORAL_TABLET | Freq: Three times a day (TID) | ORAL | 0 refills | Status: DC | PRN

## 2022-12-23 MED ORDER — LABETALOL HCL 100 MG PO TABS: 100.0000 mg | ORAL_TABLET | Freq: Two times a day (BID) | ORAL | 0 refills | Status: DC

## 2022-12-23 MED ORDER — CYCLOBENZAPRINE HCL 10 MG PO TABS: 10.0000 mg | ORAL_TABLET | Freq: Three times a day (TID) | ORAL | 0 refills | Status: DC | PRN

## 2022-12-23 NOTE — MAU Note (Signed)
.  Erika Lewis is a 37 y.o. at [redacted]w[redacted]d here in MAU reporting: HA since yesterday morning @ 1100.  Took Tylenol 500 mg twice yesterday without any relief noted.  Reports took BP last 159/84 last night, states no history of elevated BP.  Also reports burning in stomach, taking Tums, states no relief. LMP: NA Onset of complaint: yesterday Pain score: 5 Vitals:   12/23/22 1231  BP: (!) 144/70  Pulse: 86  Temp: 98.8 F (37.1 C)  SpO2: 99%     FHT:NA Lab orders placed from triage:   None

## 2022-12-23 NOTE — MAU Provider Note (Signed)
History     [id number]  Arrival date and time: 12/23/22 1100    Chief Complaint  Patient presents with   Headache     HPI Erika Lewis is a 37 y.o. at [redacted]w[redacted]d by LMP who presents for headache. Reports current headache since yesterday morning. Took 500 mg of tylenol twice yesterday without relief. Initially was left sided but is now frontal & right sided headache. Worse with loud noises. Has had some nausea. Denies fever, visual disturbance, abdominal pain, or vaginal bleeding.  Denies history of hypertension. States her BP was elevated at home.    OB History     Gravida  5   Para  2   Term  2   Preterm      AB  2   Living  2      SAB      IAB  2   Ectopic      Multiple  0   Live Births  2           Past Medical History:  Diagnosis Date   Asthma    Hx of varicella    Sickle cell trait (HCC)     Past Surgical History:  Procedure Laterality Date   NO PAST SURGERIES      Family History  Problem Relation Age of Onset   Hypertension Mother    Diabetes Mother    Sickle cell trait Mother    Diabetes Maternal Grandmother    Hypertension Maternal Grandmother    Sickle cell trait Maternal Grandmother    Sickle cell trait Sister    Sickle cell trait Son    Sickle cell trait Maternal Aunt     Social History   Socioeconomic History   Marital status: Single    Spouse name: Not on file   Number of children: Not on file   Years of education: Not on file   Highest education level: Not on file  Occupational History   Not on file  Tobacco Use   Smoking status: Never   Smokeless tobacco: Never  Vaping Use   Vaping Use: Never used  Substance and Sexual Activity   Alcohol use: No   Drug use: No   Sexual activity: Not on file  Other Topics Concern   Not on file  Social History Narrative   Not on file   Social Determinants of Health   Financial Resource Strain: Not on file  Food Insecurity: Not on file  Transportation Needs: Not on file   Physical Activity: Not on file  Stress: Not on file  Social Connections: Not on file  Intimate Partner Violence: Not on file    No Known Allergies  No current facility-administered medications on file prior to encounter.   Current Outpatient Medications on File Prior to Encounter  Medication Sig Dispense Refill   acetaminophen (TYLENOL) 325 MG tablet Take 650 mg by mouth every 6 (six) hours as needed.     Prenatal Vit-Fe Fumarate-FA (MULTIVITAMIN-PRENATAL) 27-0.8 MG TABS tablet Take 1 tablet by mouth daily at 12 noon.     albuterol (PROVENTIL HFA;VENTOLIN HFA) 108 (90 Base) MCG/ACT inhaler Inhale 1-2 puffs into the lungs every 6 (six) hours as needed for wheezing or shortness of breath. 1 Inhaler 0     ROS Pertinent positives and negative per HPI, all others reviewed and negative  Physical Exam   BP (!) 144/75   Pulse 74   Temp 98.8 F (37.1 C) (Oral)   Ht  5\' 4"  (1.626 m)   Wt 99.1 kg   LMP 10/20/2022   SpO2 99%   BMI 37.49 kg/m   Patient Vitals for the past 24 hrs:  BP Temp Temp src Pulse SpO2 Height Weight  12/23/22 1440 (!) 144/75 -- -- 74 -- -- --  12/23/22 1244 (!) 142/69 -- -- 82 -- -- --  12/23/22 1231 (!) 144/70 98.8 F (37.1 C) Oral 86 99 % -- --  12/23/22 1224 -- -- -- -- -- 5\' 4"  (1.626 m) 99.1 kg    Physical Exam Vitals and nursing note reviewed.  Constitutional:      General: She is not in acute distress.    Appearance: She is well-developed. She is not ill-appearing.  HENT:     Head: Normocephalic and atraumatic.  Eyes:     General: No scleral icterus.       Right eye: No discharge.        Left eye: No discharge.     Conjunctiva/sclera: Conjunctivae normal.  Pulmonary:     Effort: Pulmonary effort is normal. No respiratory distress.  Neurological:     General: No focal deficit present.     Mental Status: She is alert.  Psychiatric:        Mood and Affect: Mood normal.        Behavior: Behavior normal.       Labs No results found for  this or any previous visit (from the past 24 hour(s)).  Imaging No results found.  MAU Course  Procedures Lab Orders  No laboratory test(s) ordered today   Meds ordered this encounter  Medications   acetaminophen-caffeine (EXCEDRIN TENSION HEADACHE) 500-65 MG per tablet 2 tablet   cyclobenzaprine (FLEXERIL) tablet 10 mg   cyclobenzaprine (FLEXERIL) 10 MG tablet    Sig: Take 1 tablet (10 mg total) by mouth 3 (three) times daily as needed (headache).    Dispense:  30 tablet    Refill:  0    Order Specific Question:   Supervising Provider    Answer:   Janyth Pupa [id number]   metoCLOPramide (REGLAN) 10 MG tablet    Sig: Take 1 tablet (10 mg total) by mouth every 8 (eight) hours as needed for nausea (or headache).    Dispense:  30 tablet    Refill:  0    Order Specific Question:   Supervising Provider    Answer:   Janyth Pupa [id number]   labetalol (NORMODYNE) 100 MG tablet    Sig: Take 1 tablet (100 mg total) by mouth 2 (two) times daily.    Dispense:  60 tablet    Refill:  0    Order Specific Question:   Supervising Provider    Answer:   Janyth Pupa [id number]   Imaging Orders  No imaging studies ordered today    MDM moderate  Assessment and Plan   1. Pregnancy headache in first trimester  -Treated in MAU with excedrin tension & flexeril. Patient reports h/a much better & ready to be discharged home. Rx reglan & flexeril to use for future headaches  2. Chronic hypertension during pregnancy  -BPs persistently elevated in MAU & per chart review elevated in ED last month. Started labetalol 100 mg BID. Has f/u with [facility] next week.   3. [redacted] weeks gestation of pregnancy       Dispo: discharged to home in stable condition.   Discharge Instructions     Discharge patient   Complete by: As directed  Discharge disposition: 01-Home or Self Care   Discharge patient date: 12/23/2022       Jorje Guild, NP 12/23/22 5:31 PM  Allergies as of 12/23/2022   No  Known Allergies      Medication List     TAKE these medications    acetaminophen 325 MG tablet Commonly known as: TYLENOL Take 650 mg by mouth every 6 (six) hours as needed.   albuterol 108 (90 Base) MCG/ACT inhaler Commonly known as: VENTOLIN HFA Inhale 1-2 puffs into the lungs every 6 (six) hours as needed for wheezing or shortness of breath.   cyclobenzaprine 10 MG tablet Commonly known as: FLEXERIL Take 1 tablet (10 mg total) by mouth 3 (three) times daily as needed (headache).   labetalol 100 MG tablet Commonly known as: NORMODYNE Take 1 tablet (100 mg total) by mouth 2 (two) times daily.   metoCLOPramide 10 MG tablet Commonly known as: REGLAN Take 1 tablet (10 mg total) by mouth every 8 (eight) hours as needed for nausea (or headache).   multivitamin-prenatal 27-0.8 MG Tabs tablet Take 1 tablet by mouth daily at 12 noon.

## 2022-12-23 NOTE — Discharge Instructions (Signed)
For prevention of migraines in pregnancy: -Magnesium, 400mg by mouth, once daily -Vitamin B2, 400mg by mouth, once daily  For treatment of migraines in pregnancy: -take medication at the first sign of the pain of a headache, or the first sign of your aura -start with 1000mg Tylenol (or excedrin tension headache with acetaminophen & caffeine only, NOT aspirin) with or without Reglan 10mg -if no relief after 1-2hours, can take Flexeril 10mg -Do not take more than 4000 mg of tylenol (acetaminophen) per day  

## 2023-01-02 LAB — OB RESULTS CONSOLE HIV ANTIBODY (ROUTINE TESTING): HIV: NONREACTIVE

## 2023-01-02 LAB — OB RESULTS CONSOLE HEPATITIS B SURFACE ANTIGEN: Hepatitis B Surface Ag: NEGATIVE

## 2023-01-02 LAB — OB RESULTS CONSOLE RPR: RPR: NONREACTIVE

## 2023-01-02 LAB — OB RESULTS CONSOLE ANTIBODY SCREEN: Antibody Screen: NEGATIVE

## 2023-01-02 LAB — OB RESULTS CONSOLE RUBELLA ANTIBODY, IGM: Rubella: IMMUNE

## 2023-01-05 ENCOUNTER — Other Ambulatory Visit: Payer: Self-pay

## 2023-01-05 DIAGNOSIS — E559 Vitamin D deficiency, unspecified: Secondary | ICD-10-CM

## 2023-01-05 HISTORY — DX: Vitamin D deficiency, unspecified: E55.9

## 2023-01-09 ENCOUNTER — Other Ambulatory Visit: Payer: Self-pay | Admitting: Obstetrics and Gynecology

## 2023-01-09 DIAGNOSIS — O30009 Twin pregnancy, unspecified number of placenta and unspecified number of amniotic sacs, unspecified trimester: Secondary | ICD-10-CM

## 2023-01-17 DIAGNOSIS — Z148 Genetic carrier of other disease: Secondary | ICD-10-CM | POA: Insufficient documentation

## 2023-01-21 LAB — OB RESULTS CONSOLE GC/CHLAMYDIA
Chlamydia: NEGATIVE
Neisseria Gonorrhea: NEGATIVE

## 2023-02-01 LAB — LAB REPORT - SCANNED: A1c: 6.2

## 2023-02-05 ENCOUNTER — Encounter: Payer: Self-pay | Admitting: Dietician

## 2023-02-05 ENCOUNTER — Encounter: Payer: Medicaid Other | Attending: Obstetrics and Gynecology | Admitting: Dietician

## 2023-02-05 VITALS — Ht 64.0 in | Wt 228.0 lb

## 2023-02-05 DIAGNOSIS — O24111 Pre-existing diabetes mellitus, type 2, in pregnancy, first trimester: Secondary | ICD-10-CM

## 2023-02-05 NOTE — Patient Instructions (Addendum)
Ask your doctor for a prescription for the Dexcom G7 CGM.  This is covered by Medicaid without requiring insulin.  This prescription should be sent to the Pharmacy.  Your phone is compatible so you will not need a reader.  Your blood glucose was 212 - 2 hours after lunch today.  Please inform your doctor.  Order waterproof overpatches for the Dexcom G7 from [employer].  3 meals and 3 snacks  Consistently use the CGM.  Finger prick when you are low to confirm. Small amounts of protein with each meal and snack.  Eat more Non-Starchy Vegetables These include greens, broccoli, cauliflower, cabbage, carrots, beets, eggplant, peppers, squash and others. Minimize added sugars and refined grains Rethink what you drink.  Choose beverages without added sugar.  Look for 0 carbs on the label. See the list of whole grains below.  Find alternatives to usual sweet treats. Choose whole foods over processed. Make simple meals at home more often than eating out.

## 2023-02-05 NOTE — Progress Notes (Signed)
Diabetes Self-Management Education  Visit Type: First/Initial  Appt. Start Time: 1405 Appt. End Time: 1530  02/12/2023  Erika Lewis, identified by name and date of birth, is a 37 y.o. female with a diagnosis of Diabetes: Type 2.   ASSESSMENT Patient is here today alone.  Estimated Due Date:  07/27/2023 twin pregnancy Current [redacted] weeks gestation.  She knows how to use a blood glucose and tested now in the office which was 212 - 2 hours after lunch. We have a sample of the Dexcom G7 sensor and will place this now.  Instructed patient to reach out to her MD for an order.    History includes:  newly diagnosed Type 2 Diabetes (12/2022), HTN, asthma A1C 6.5% 01/05/2023 and 6.2% 01/18/2023  64"  228 lbs 02/05/2023 208 lbs 10/2022  Patient lives with her boyfriend, 67 yo and 64 yo children.  They share the shopping and cooking. 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  Work hours have been reduced. Increased fullness during her first trimester and appetite is slowly increasing.  Height 5\' 4"  (1.626 m), weight 228 lb (103.4 kg), last menstrual period 10/20/2022. Body mass index is 39.14 kg/m.   Diabetes Self-Management Education - 02/05/23 1418       Visit Information   Visit Type First/Initial      Initial Visit   Diabetes Type Type 2    Date Diagnosed 12/2022    Are you currently following a meal plan? No    Are you taking your medications as prescribed? Not on Medications      Health Coping   How would you rate your overall health? Fair      Psychosocial Assessment   Patient Belief/Attitude about Diabetes Motivated to manage diabetes    Self-care barriers None    Self-management support Doctor's office    Other persons present Patient    Patient Concerns Nutrition/Meal planning;Glycemic Control;Monitoring    Special Needs None;Verbal instruction    Preferred Learning Style No preference indicated     Learning Readiness Not Ready    How often do you need to have someone help you when you read instructions, pamphlets, or other written materials from your doctor or pharmacy? 1 - Never    What is the last grade level you completed in school? 12      Pre-Education Assessment   Patient understands the diabetes disease and treatment process. Needs Instruction    Patient understands incorporating nutritional management into lifestyle. Needs Instruction    Patient undertands incorporating physical activity into lifestyle. Needs Instruction    Patient understands using medications safely. Needs Instruction    Patient understands monitoring blood glucose, interpreting and using results Needs Instruction    Patient understands prevention, detection, and treatment of acute complications. Needs Instruction    Patient understands prevention, detection, and treatment of chronic complications. Needs Instruction    Patient understands how to develop strategies to address psychosocial issues. Needs Instruction    Patient understands how to develop strategies to promote health/change behavior. Needs Instruction      Complications   Last HgB A1C per patient/outside source 6.2 %   01/15/2023 decreased from 6.5% 01/05/2023   Have you had a dilated eye exam in the past 12 months? Yes    Have you had a dental exam in the past 12 months? No    Are you checking your feet?  No      Dietary Intake   Breakfast skips at times or fruit    Snack (morning) none    Lunch fried rice and general tso's chicken    Snack (afternoon) fruit or nuts    Dinner spaghetti with meat sauce    Snack (evening) none    Beverage(s) water, occasional regular sprite      Activity / Exercise   Activity / Exercise Type Light (walking / raking leaves)   runs out of breath easily   How many days per week do you exercise? 7    How many minutes per day do you exercise? 10    Total minutes per week of exercise 70      Patient Education    Previous Diabetes Education No    Disease Pathophysiology Definition of diabetes, type 1 and 2, and the diagnosis of diabetes    Healthy Eating Role of diet in the treatment of diabetes and the relationship between the three main macronutrients and blood glucose level;Food label reading, portion sizes and measuring food.;Plate Method;Carbohydrate counting;Meal options for control of blood glucose level and chronic complications.    Being Active Role of exercise on diabetes management, blood pressure control and cardiac health.;Helped patient identify appropriate exercises in relation to his/her diabetes, diabetes complications and other health issue.    Medications Reviewed patients medication for diabetes, action, purpose, timing of dose and side effects.    Monitoring Identified appropriate SMBG and/or A1C goals.;Purpose and frequency of SMBG.    Acute complications Taught prevention, symptoms, and  treatment of hypoglycemia - the 15 rule.;Discussed and identified patients' prevention, symptoms, and treatment of hyperglycemia.    Chronic complications Relationship between chronic complications and blood glucose control    Diabetes Stress and Support Identified and addressed patients feelings and concerns about diabetes;Worked with patient to identify barriers to care and solutions;Role of stress on diabetes    Preconception care Pregnancy and GDM  Role of pre-pregnancy blood glucose control on the development of the fetus      Individualized Goals (developed by patient)   Nutrition Follow meal plan discussed    Physical Activity Exercise 5-7 days per week;15 minutes per day    Medications take my medication as prescribed    Monitoring  Consistenly use CGM    Problem Solving Eating Pattern;Addressing barriers to behavior change    Reducing Risk examine blood glucose patterns;treat hypoglycemia with 15 grams of carbs if blood glucose less than 70mg /dL;do foot checks daily    Health Coping Ask for  help with psychological, social, or emotional issues      Post-Education Assessment   Patient understands the diabetes disease and treatment process. Demonstrates understanding / competency    Patient understands incorporating nutritional management into lifestyle. Demonstrates understanding / competency    Patient undertands incorporating physical activity into lifestyle. Demonstrates understanding / competency    Patient understands using medications safely. Demonstrates understanding / competency    Patient understands monitoring blood glucose, interpreting and using results Demonstrates understanding / competency    Patient understands prevention, detection, and treatment of acute complications. Demonstrates understanding / competency    Patient understands prevention, detection, and treatment of chronic complications. Demonstrates understanding / competency    Patient understands how to develop strategies to address psychosocial issues. Demonstrates understanding / competency    Patient understands how to develop strategies to promote health/change behavior. Comprehends key points      Outcomes   Expected Outcomes Demonstrated interest in  learning. Expect positive outcomes    Future DMSE 4-6 wks    Program Status Not Completed             Individualized Plan for Diabetes Self-Management Training:   Learning Objective:  Patient will have a greater understanding of diabetes self-management. Patient education plan is to attend individual and/or group sessions per assessed needs and concerns.   Plan:   Patient Instructions  Ask your doctor for a prescription for the Dexcom G7 CGM.  This is covered by Medicaid without requiring insulin.  This prescription should be sent to the Pharmacy.  Your phone is compatible so you will not need a reader.  Your blood glucose was 212 - 2 hours after lunch today.  Please inform your doctor.  Order waterproof overpatches for the Dexcom G7 from  [employer].  3 meals and 3 snacks  Consistently use the CGM.  Finger prick when you are low to confirm. Small amounts of protein with each meal and snack.  Eat more Non-Starchy Vegetables These include greens, broccoli, cauliflower, cabbage, carrots, beets, eggplant, peppers, squash and others. Minimize added sugars and refined grains Rethink what you drink.  Choose beverages without added sugar.  Look for 0 carbs on the label. See the list of whole grains below.  Find alternatives to usual sweet treats. Choose whole foods over processed. Make simple meals at home more often than eating out.        Expected Outcomes:  Demonstrated interest in learning. Expect positive outcomes  Education material provided: ADA - How to Thrive: A Guide for Your Journey with Diabetes, Food label handouts, Meal plan card, and Snack sheet, Diabetes During pregnancy  If problems or questions, patient to contact team via:  Phone  Future DSME appointment: 4-6 wks

## 2023-02-12 ENCOUNTER — Ambulatory Visit: Payer: Medicaid Other | Admitting: *Deleted

## 2023-02-12 ENCOUNTER — Ambulatory Visit (HOSPITAL_BASED_OUTPATIENT_CLINIC_OR_DEPARTMENT_OTHER): Payer: Medicaid Other | Admitting: Maternal & Fetal Medicine

## 2023-02-12 ENCOUNTER — Ambulatory Visit: Payer: Medicaid Other | Attending: Obstetrics and Gynecology

## 2023-02-12 VITALS — BP 131/65 | HR 97

## 2023-02-12 DIAGNOSIS — O99212 Obesity complicating pregnancy, second trimester: Secondary | ICD-10-CM

## 2023-02-12 DIAGNOSIS — O30042 Twin pregnancy, dichorionic/diamniotic, second trimester: Secondary | ICD-10-CM | POA: Diagnosis not present

## 2023-02-12 DIAGNOSIS — O35CXX1 Maternal care for other (suspected) fetal abnormality and damage, fetal pulmonary anomalies, fetus 1: Secondary | ICD-10-CM

## 2023-02-12 DIAGNOSIS — O09522 Supervision of elderly multigravida, second trimester: Secondary | ICD-10-CM | POA: Diagnosis not present

## 2023-02-12 DIAGNOSIS — Q330 Congenital cystic lung: Secondary | ICD-10-CM | POA: Insufficient documentation

## 2023-02-12 DIAGNOSIS — E119 Type 2 diabetes mellitus without complications: Secondary | ICD-10-CM

## 2023-02-12 DIAGNOSIS — I87309 Chronic venous hypertension (idiopathic) without complications of unspecified lower extremity: Secondary | ICD-10-CM

## 2023-02-12 DIAGNOSIS — O99012 Anemia complicating pregnancy, second trimester: Secondary | ICD-10-CM

## 2023-02-12 DIAGNOSIS — Z3A16 16 weeks gestation of pregnancy: Secondary | ICD-10-CM

## 2023-02-12 DIAGNOSIS — O24112 Pre-existing diabetes mellitus, type 2, in pregnancy, second trimester: Secondary | ICD-10-CM | POA: Diagnosis not present

## 2023-02-12 DIAGNOSIS — O30009 Twin pregnancy, unspecified number of placenta and unspecified number of amniotic sacs, unspecified trimester: Secondary | ICD-10-CM

## 2023-02-12 DIAGNOSIS — O99412 Diseases of the circulatory system complicating pregnancy, second trimester: Secondary | ICD-10-CM

## 2023-02-12 DIAGNOSIS — E669 Obesity, unspecified: Secondary | ICD-10-CM

## 2023-02-12 DIAGNOSIS — D571 Sickle-cell disease without crisis: Secondary | ICD-10-CM

## 2023-02-12 NOTE — Progress Notes (Unsigned)
MFM Consultation  Ms. Goff is a 37 yo G5P2 with an EDD of 07/25/23 at 16 w 5d. She is seen at the request of Ms. Nigel Bridgeman for the following:   1) AMA- She had drawn NIPT that is pending.  2) Diamniotic dichorionic twin pregnancy - She is taking baby aspirin 3) BMI> 35  4) Type 2 Diabetes:  She reports that this is new diagnosis but has not had diabetic teaching yet. She reports that her FBS is the 100's -120's mg/dL and her 2hr PP is 161-096'E. She did not have her blood sugar logs.   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  The is lesion is right sided and measures ~1.5 with a CVR of 0.07 which suggest a low risk for hydrops.   6) Chronic venous hypertension was prescribed Labetalol 100 mg BID but discontinued the medical therapy after the first dose. Her documented blood pressure has been 138/80's.   Overall she feels good and denies s/sx of preterm labor. She has a known sickle cell trait but her significant other tested neg.  She had an early diagnosis of subchorionic hematoma but denies bleeding.    OB History  Gravida Para Term Preterm AB Living  5 2 2   2 2   SAB IAB Ectopic Multiple Live Births    2   0 2    # Outcome Date GA Lbr Len/2nd Weight Sex Delivery Anes PTL Lv  5 Current           4 Term 08/25/14 [redacted]w[redacted]d 14:43 / 00:36 8 lb 10.5 oz (3.925 kg) M Vag-Spont EPI  LIV  3 Term 08/31/06 [redacted]w[redacted]d   M Vag-Spont  Y LIV  2 IAB           1 IAB            Past Medical History:  Diagnosis Date   Asthma    Diabetes mellitus without complication (HCC)    Hx of varicella    Hypertension    Sickle cell trait (HCC)    Past Surgical History:  Procedure Laterality Date   NO PAST SURGERIES     Family History  Problem Relation Age of Onset   Hypertension Mother    Diabetes Mother    Sickle cell trait Mother    Diabetes Maternal Grandmother    Hypertension Maternal Grandmother     Sickle cell trait Maternal Grandmother    Sickle cell trait Sister    Sickle cell trait Son    Sickle cell trait Maternal Aunt      Current Outpatient Medications (Cardiovascular):    labetalol (NORMODYNE) 100 MG tablet, Take 1 tablet (100 mg total) by mouth 2 (two) times daily. (Patient not taking: Reported on 02/05/2023)  Current Outpatient Medications (Respiratory):    albuterol (PROVENTIL HFA;VENTOLIN HFA) 108 (90 Base) MCG/ACT inhaler, Inhale 1-2 puffs into the lungs every 6 (six) hours as needed for wheezing or shortness of breath.  Current Outpatient Medications (Analgesics):    acetaminophen (TYLENOL) 325 MG tablet, Take 650 mg by mouth every 6 (six) hours as needed.   ibuprofen (ADVIL) 600 MG tablet, Take 600 mg by mouth every 6 (six) hours as needed.   Current Outpatient Medications (Other):    cyclobenzaprine (FLEXERIL) 10 MG tablet, Take 1 tablet (10 mg total) by mouth 3 (three) times daily as needed (headache).   metoCLOPramide (REGLAN) 10  MG tablet, Take 1 tablet (10 mg total) by mouth every 8 (eight) hours as needed for nausea (or headache). (Patient not taking: Reported on 02/12/2023)   Prenatal Vit-Fe Fumarate-FA (MULTIVITAMIN-PRENATAL) 27-0.8 MG TABS tablet, Take 1 tablet by mouth daily at 12 noon.    No Known Allergies   Imaging: Diamniotic Dichorionic twin pregnancy with measurements consistent with dates Due to early gestation a detailed exam is precluded. There is good amniotic fluid and movement in both twins Twin discordance is 18%. Suboptimal views of the fetal anatomy was also limited.   In Twin A we observed a right sided chest mass suggestive of a microcystic CPAM. There is no large cyst, there is no cardiac shift, no evidence of hydrops, there is no feed vessel off of the aorta. The CVR is 0.07.   Impression/Counseling:  1) Twin A CPAM We discussed the finding of a congenital pulmonary airway malformation (CPAM) on today's sonogram. This finding  is characterized by a large intrathorasic mass that can be microcystic or macrocystic in nature. Ten to 20 percent of patients have associated congenital abnormalities, such as esophageal atresia with tracheoesophageal fistula, bilateral renal agenesis or dysgenesis, intestinal atresia, other pulmonary malformations, and diaphragmatic, cardiac, central nervous system, and bony anomalies. A fetal echocardiogram is recommended in cases of CCAM. The most significant intrauterine complication is the development of fetal hydrops. The risk of this can be stratified by using the CVR (CCAM volume ratio), which is defined as (LxHxWx0.52)/HC. When this ratio is less than <1.2, the risk of hydrops is low and surveillance can be weekly initially, then every 2 weeks depending on growth. If the ratio is 1.2-1.6, surveillance should be Initially twice weekly and then weekly depending on growth. For a CCAM volume ratio > 1.6, the risk of hydrops is around 80%, and twice weekly evaluation is warranted. Fetal hydrops in the setting of CPAM has been reported to cause maternal mirror syndrome, a rare condition that is clinically similar to severe preeclampsia and is seen in conjunction with non-immune hydrops. Mirror syndrome is life threatening and necessitates immediate delivery regardless of gestational age. While approximately 20-50% of cases of CPAM will resolve spontaneously, persistence of a large mass can lead to the need for ECMO following delivery. As such, delivery at a tertiary facility and prenatal consultation with NICU and Pediatric surgery is advised.  I discussed with Ms. Reaume that the average volume is ~1.5 cm. Again the CVR is 0.07 suggesting low risk for fetal hydrops. We discussed that the lesion is likely to grow until 26 weeks. Therefore we will continue q 2 week surveillance until after that time.  We have also scheduled a fetal echocardi[facility] at [facility].   2) Diamniotic Dichorionic twin pregnancy  We  reviewed the sonographic findings and limitations of ultrasound. The potential risks associated with a twin gestation were discussed.  This discussion included a review of the increased risk of miscarriages, anomalies, preterm labor, and/or delivery, malpresentation, delivery via cesarean section, gestational diabetes, and/or preeclampsia.  With regards to fetal risks, there is an increased risk for fetal growth restriction of one or both twins, preterm labor, and associated morbidity, and intrauterine fetal demise.    We recommend growth scans every 4 weeks starting at 24 weeks with the initation of weekly antenatal testing in the form of twice weekly NST or weekly BPP should abnormal fetal growth or intertwin discordance of greater than 20-25% is noted.   For preeclampsia prevention we recommend daily low dose ASA.  Following counseling, all questions were addressed.    3) BMI 35 We discussed 11-20 lbs weight gain and increased risk for preeclampsia, fetal macrosomia, stillbirth, cesarean delivery, infection and thrombosis in pregancy.   4) Type 2 DM  We discussed the diagnosis, evaluation, management and complication related to T2DM. We discussed that the mainstay of management includes nutrition, exercise and medical therapy.  We discussed the goals of FBS < 95 mg/dL and 2hr pp < 696 mg/dL. We reviewed the increased risk for fetal macrosomia, birth trauma, cesarean delivery, preeclampsia, neonatal admission, and stillbirth in women whose blood sugar is not well controlled. In addition, we discussed the increased risk for fetal cardiac defects in women with entering pregnancy with poorly controlled diabetes. A hgb A1c would be helpful to understand her control prior to pregnancy.  As a result we recommend serial growth exams every 4 weeks with weekly testing initiated at [redacted] weeks gestation.  I discussed with Ms. Gillian Scarce and her significant other some nutritional and exercise strategies that  would serve her to understand how her nutrition can impact her blood sugars, however, based on her report of glucose logs she will likely need medical therapy.   I spent 60 minutes with > 50% in face to face consultation.  Novella Olive, MD

## 2023-02-13 ENCOUNTER — Other Ambulatory Visit: Payer: Self-pay

## 2023-02-13 DIAGNOSIS — O30042 Twin pregnancy, dichorionic/diamniotic, second trimester: Secondary | ICD-10-CM

## 2023-02-13 DIAGNOSIS — Q330 Congenital cystic lung: Secondary | ICD-10-CM

## 2023-02-13 DIAGNOSIS — O24112 Pre-existing diabetes mellitus, type 2, in pregnancy, second trimester: Secondary | ICD-10-CM

## 2023-02-13 NOTE — Progress Notes (Unsigned)
Us/

## 2023-02-19 DIAGNOSIS — E119 Type 2 diabetes mellitus without complications: Secondary | ICD-10-CM | POA: Insufficient documentation

## 2023-02-26 ENCOUNTER — Encounter: Payer: Self-pay | Admitting: *Deleted

## 2023-02-28 ENCOUNTER — Ambulatory Visit: Payer: Medicaid Other | Attending: Maternal & Fetal Medicine

## 2023-02-28 ENCOUNTER — Ambulatory Visit: Payer: Medicaid Other | Admitting: *Deleted

## 2023-02-28 ENCOUNTER — Other Ambulatory Visit: Payer: Self-pay | Admitting: *Deleted

## 2023-02-28 VITALS — BP 127/57 | HR 113

## 2023-02-28 DIAGNOSIS — Q330 Congenital cystic lung: Secondary | ICD-10-CM

## 2023-02-28 DIAGNOSIS — O24112 Pre-existing diabetes mellitus, type 2, in pregnancy, second trimester: Secondary | ICD-10-CM

## 2023-02-28 DIAGNOSIS — O30042 Twin pregnancy, dichorionic/diamniotic, second trimester: Secondary | ICD-10-CM

## 2023-02-28 DIAGNOSIS — O09522 Supervision of elderly multigravida, second trimester: Secondary | ICD-10-CM

## 2023-02-28 DIAGNOSIS — Z3A18 18 weeks gestation of pregnancy: Secondary | ICD-10-CM

## 2023-02-28 DIAGNOSIS — E119 Type 2 diabetes mellitus without complications: Secondary | ICD-10-CM | POA: Diagnosis not present

## 2023-02-28 DIAGNOSIS — O35CXX1 Maternal care for other (suspected) fetal abnormality and damage, fetal pulmonary anomalies, fetus 1: Secondary | ICD-10-CM | POA: Diagnosis not present

## 2023-02-28 DIAGNOSIS — O99012 Anemia complicating pregnancy, second trimester: Secondary | ICD-10-CM

## 2023-02-28 DIAGNOSIS — E669 Obesity, unspecified: Secondary | ICD-10-CM

## 2023-02-28 DIAGNOSIS — O99212 Obesity complicating pregnancy, second trimester: Secondary | ICD-10-CM

## 2023-02-28 DIAGNOSIS — D571 Sickle-cell disease without crisis: Secondary | ICD-10-CM

## 2023-03-04 ENCOUNTER — Encounter: Payer: Self-pay | Admitting: Dietician

## 2023-03-04 ENCOUNTER — Encounter: Payer: Medicaid Other | Attending: Obstetrics and Gynecology | Admitting: Dietician

## 2023-03-04 DIAGNOSIS — O24112 Pre-existing diabetes mellitus, type 2, in pregnancy, second trimester: Secondary | ICD-10-CM | POA: Insufficient documentation

## 2023-03-04 DIAGNOSIS — O24419 Gestational diabetes mellitus in pregnancy, unspecified control: Secondary | ICD-10-CM | POA: Insufficient documentation

## 2023-03-04 NOTE — Progress Notes (Unsigned)
  Patient is here today alone.  [redacted] weeks gestation - Estimated Due Date July 27, 2023. She states that she has maintained her weight at 228 lbs and has been noted that she needs to gain weight. Small appetite, early satiety. She is walking 15-20 minutes daily. She states that she has applied for [facility] and is planning on attending a breastfeeding class through them.  Using the Dexcom G7 CGM:    Fasting 87-132 with a couple of outliers  After meal readings 95-150 overall  CGM Results from download:   % Time CGM active:   100 %   (Goal >70%)  Average glucose:   136 mg/dL for 14 days  Glucose management indicator:   6.6 %  Time in range (70-180 mg/dL):   93 %   (Goal >16%)  Time High (181-250 mg/dL):   6 %   (Goal < 10%)  Time Very High (>250 mg/dL):    <1 %   (Goal < 5%)  Time Low (54-69 mg/dL):   0 %   (Goal <9%)  Time Very Low (<54 mg/dL):   0 %   (Goal <6%)   History includes:  newly diagnosed Type 2 Diabetes (12/2022), HTN, asthma A1C 6.5% 01/05/2023 and 6.2% 01/18/2023   64"  228 lbs 02/05/2023 208 lbs 10/2022   Patient lives with her boyfriend, 17 yo and 50 yo children.  They share the shopping and cooking. 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  Work hours have been reduced. Increased fullness during her first trimester and appetite is slowly increasing.  10 am:  OJ[employer] Garden omelet and corned beef hash D:  chicken wrap OR grilled chicken wings and grilled green beans B:  water, rare OJ

## 2023-03-04 NOTE — Patient Instructions (Addendum)
3 meals and 3 snacks  Consistently use the CGM.  Finger prick when you are low to confirm. Small amounts of protein with each meal and snack. Continue to walk daily (as allowed by MD).

## 2023-03-06 ENCOUNTER — Ambulatory Visit: Payer: Medicaid Other | Admitting: *Deleted

## 2023-03-06 ENCOUNTER — Ambulatory Visit: Payer: Medicaid Other | Attending: Obstetrics and Gynecology

## 2023-03-06 VITALS — BP 122/63 | HR 93

## 2023-03-06 DIAGNOSIS — O30009 Twin pregnancy, unspecified number of placenta and unspecified number of amniotic sacs, unspecified trimester: Secondary | ICD-10-CM | POA: Diagnosis present

## 2023-03-06 DIAGNOSIS — E669 Obesity, unspecified: Secondary | ICD-10-CM

## 2023-03-06 DIAGNOSIS — O24112 Pre-existing diabetes mellitus, type 2, in pregnancy, second trimester: Secondary | ICD-10-CM

## 2023-03-06 DIAGNOSIS — O99212 Obesity complicating pregnancy, second trimester: Secondary | ICD-10-CM

## 2023-03-06 DIAGNOSIS — D571 Sickle-cell disease without crisis: Secondary | ICD-10-CM

## 2023-03-06 DIAGNOSIS — O24419 Gestational diabetes mellitus in pregnancy, unspecified control: Secondary | ICD-10-CM | POA: Insufficient documentation

## 2023-03-06 DIAGNOSIS — O30042 Twin pregnancy, dichorionic/diamniotic, second trimester: Secondary | ICD-10-CM

## 2023-03-06 DIAGNOSIS — O99012 Anemia complicating pregnancy, second trimester: Secondary | ICD-10-CM

## 2023-03-06 DIAGNOSIS — O10012 Pre-existing essential hypertension complicating pregnancy, second trimester: Secondary | ICD-10-CM | POA: Diagnosis not present

## 2023-03-06 DIAGNOSIS — O35CXX1 Maternal care for other (suspected) fetal abnormality and damage, fetal pulmonary anomalies, fetus 1: Secondary | ICD-10-CM | POA: Diagnosis not present

## 2023-03-06 DIAGNOSIS — E119 Type 2 diabetes mellitus without complications: Secondary | ICD-10-CM

## 2023-03-06 DIAGNOSIS — O09522 Supervision of elderly multigravida, second trimester: Secondary | ICD-10-CM

## 2023-03-06 DIAGNOSIS — Z3A19 19 weeks gestation of pregnancy: Secondary | ICD-10-CM

## 2023-03-12 ENCOUNTER — Ambulatory Visit: Payer: Medicaid Other

## 2023-03-15 ENCOUNTER — Encounter: Payer: Self-pay | Admitting: *Deleted

## 2023-03-15 DIAGNOSIS — O10919 Unspecified pre-existing hypertension complicating pregnancy, unspecified trimester: Secondary | ICD-10-CM | POA: Insufficient documentation

## 2023-03-15 DIAGNOSIS — O09529 Supervision of elderly multigravida, unspecified trimester: Secondary | ICD-10-CM | POA: Insufficient documentation

## 2023-03-19 ENCOUNTER — Telehealth: Payer: Self-pay | Admitting: Dietician

## 2023-03-19 ENCOUNTER — Ambulatory Visit: Payer: Medicaid Other | Admitting: *Deleted

## 2023-03-19 ENCOUNTER — Ambulatory Visit: Payer: Medicaid Other | Attending: Obstetrics and Gynecology

## 2023-03-19 VITALS — BP 121/57 | HR 103

## 2023-03-19 DIAGNOSIS — O35CXX2 Maternal care for other (suspected) fetal abnormality and damage, fetal pulmonary anomalies, fetus 2: Secondary | ICD-10-CM

## 2023-03-19 DIAGNOSIS — Q330 Congenital cystic lung: Secondary | ICD-10-CM | POA: Diagnosis present

## 2023-03-19 DIAGNOSIS — Z3A21 21 weeks gestation of pregnancy: Secondary | ICD-10-CM

## 2023-03-19 DIAGNOSIS — D573 Sickle-cell trait: Secondary | ICD-10-CM

## 2023-03-19 DIAGNOSIS — O30042 Twin pregnancy, dichorionic/diamniotic, second trimester: Secondary | ICD-10-CM

## 2023-03-19 DIAGNOSIS — O99212 Obesity complicating pregnancy, second trimester: Secondary | ICD-10-CM

## 2023-03-19 DIAGNOSIS — O09522 Supervision of elderly multigravida, second trimester: Secondary | ICD-10-CM

## 2023-03-19 DIAGNOSIS — E669 Obesity, unspecified: Secondary | ICD-10-CM

## 2023-03-19 DIAGNOSIS — O24112 Pre-existing diabetes mellitus, type 2, in pregnancy, second trimester: Secondary | ICD-10-CM | POA: Insufficient documentation

## 2023-03-19 DIAGNOSIS — O285 Abnormal chromosomal and genetic finding on antenatal screening of mother: Secondary | ICD-10-CM

## 2023-03-19 NOTE — Telephone Encounter (Signed)
Patient called to inform me that she has been started on Lantus and taken off of Metformin.  She is to start 10 units of Lantus q HS. 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  She is confident in insulin administration.  She is aware of how to inject as well as need to rotate insulin injection sites.    Reviewed that Lantus works for 18-24 hours and be aware of sensor readings particularly in the evening.  She will call for further questions.  Future follow up in August.  Oran Rein, RD, LDN, CDCES

## 2023-04-05 ENCOUNTER — Ambulatory Visit: Payer: Medicaid Other | Attending: Obstetrics and Gynecology

## 2023-04-05 ENCOUNTER — Ambulatory Visit: Payer: Medicaid Other

## 2023-04-18 ENCOUNTER — Ambulatory Visit (HOSPITAL_BASED_OUTPATIENT_CLINIC_OR_DEPARTMENT_OTHER): Payer: Medicaid Other | Admitting: Maternal & Fetal Medicine

## 2023-04-18 ENCOUNTER — Ambulatory Visit: Payer: Medicaid Other | Attending: Obstetrics and Gynecology

## 2023-04-18 ENCOUNTER — Ambulatory Visit: Payer: Medicaid Other | Admitting: *Deleted

## 2023-04-18 ENCOUNTER — Other Ambulatory Visit: Payer: Self-pay | Admitting: *Deleted

## 2023-04-18 VITALS — BP 124/64 | HR 96

## 2023-04-18 DIAGNOSIS — O402XX2 Polyhydramnios, second trimester, fetus 2: Secondary | ICD-10-CM | POA: Diagnosis not present

## 2023-04-18 DIAGNOSIS — Q330 Congenital cystic lung: Secondary | ICD-10-CM

## 2023-04-18 DIAGNOSIS — O30042 Twin pregnancy, dichorionic/diamniotic, second trimester: Secondary | ICD-10-CM | POA: Insufficient documentation

## 2023-04-18 DIAGNOSIS — O35EXX2 Maternal care for other (suspected) fetal abnormality and damage, fetal genitourinary anomalies, fetus 2: Secondary | ICD-10-CM

## 2023-04-18 DIAGNOSIS — E1100 Type 2 diabetes mellitus with hyperosmolarity without nonketotic hyperglycemic-hyperosmolar coma (NKHHC): Secondary | ICD-10-CM

## 2023-04-18 DIAGNOSIS — O24112 Pre-existing diabetes mellitus, type 2, in pregnancy, second trimester: Secondary | ICD-10-CM

## 2023-04-18 DIAGNOSIS — O359XX1 Maternal care for (suspected) fetal abnormality and damage, unspecified, fetus 1: Secondary | ICD-10-CM | POA: Diagnosis present

## 2023-04-18 DIAGNOSIS — O359XX0 Maternal care for (suspected) fetal abnormality and damage, unspecified, not applicable or unspecified: Secondary | ICD-10-CM | POA: Insufficient documentation

## 2023-04-18 DIAGNOSIS — O99212 Obesity complicating pregnancy, second trimester: Secondary | ICD-10-CM

## 2023-04-18 DIAGNOSIS — O359XX2 Maternal care for (suspected) fetal abnormality and damage, unspecified, fetus 2: Secondary | ICD-10-CM | POA: Insufficient documentation

## 2023-04-18 DIAGNOSIS — O35CXX1 Maternal care for other (suspected) fetal abnormality and damage, fetal pulmonary anomalies, fetus 1: Secondary | ICD-10-CM | POA: Diagnosis not present

## 2023-04-18 DIAGNOSIS — E669 Obesity, unspecified: Secondary | ICD-10-CM

## 2023-04-18 DIAGNOSIS — O09522 Supervision of elderly multigravida, second trimester: Secondary | ICD-10-CM | POA: Insufficient documentation

## 2023-04-18 DIAGNOSIS — E1169 Type 2 diabetes mellitus with other specified complication: Secondary | ICD-10-CM | POA: Diagnosis not present

## 2023-04-18 DIAGNOSIS — O35EXX0 Maternal care for other (suspected) fetal abnormality and damage, fetal genitourinary anomalies, not applicable or unspecified: Secondary | ICD-10-CM | POA: Insufficient documentation

## 2023-04-18 DIAGNOSIS — D573 Sickle-cell trait: Secondary | ICD-10-CM

## 2023-04-18 DIAGNOSIS — O10912 Unspecified pre-existing hypertension complicating pregnancy, second trimester: Secondary | ICD-10-CM | POA: Insufficient documentation

## 2023-04-18 DIAGNOSIS — Z3A25 25 weeks gestation of pregnancy: Secondary | ICD-10-CM | POA: Insufficient documentation

## 2023-04-18 DIAGNOSIS — O99012 Anemia complicating pregnancy, second trimester: Secondary | ICD-10-CM

## 2023-04-18 DIAGNOSIS — Z794 Long term (current) use of insulin: Secondary | ICD-10-CM | POA: Insufficient documentation

## 2023-04-18 NOTE — Progress Notes (Signed)
Patient information  Patient Name: Erika Lewis  Patient MRN:   [id number]  Referring practice: MFM Referring Provider: [facility] [place])  MFM CONSULT  LYDA COLCORD is a 37 y.o. [phone] at [redacted]w[redacted]d here for ultrasound and consultation.   RE DM2: Patient did not bring her blood sugar log to today's clinic.  She reports that she has both high and low blood sugar.  She is not able to tell me exactly when because she has not recorded her blood sugars.  I discussed this is very important to minimize any adverse outcomes such as stillbirth and she needs to bring her blood sugars to her doctors visits to better manage her pregnancy and adjust her insulin.  Timing of delivery will likely be around 36 to 37 weeks or sooner if indicated depending upon her blood sugar control.  Antenatal testing should start around 30 weeks and she is not recording her blood sugars and bringing them to the office.  TWIN A: Female fetus with a known CPAM.  The CVR today was 0.21.  Since she is now 25 weeks it is most common that the lung mass will regress.  This indicates a low risk for fetal hydrops.  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  This will need postnatal imaging and eventually fetal surgery after delivery due to the increased risk of malignancy in the specific lesions.  Management will ultimately be deferred to the pediatric surgeons.  There is also a small anechoic structure resembling a pelvic cyst.  This is likely an ovarian cyst but could also represent a gastrointestinal or mesenteric cyst among other etiologies.  There is no blood supply or echogenicity within the cyst and it is currently small and we will continue to follow it.  TWIN B: Female fetus with an absent nasal bone and mild urinary tract dilation in addition to polyhydramnios.  I discussed these are all soft markers for Down syndrome and either cell free DNA or diagnostic testing is advised if she would like to further risk stratify the  genetic profile of her fetus.  At this time she continues to decline both options.  She will discuss with her OB if she is interested in pursuing cell free DNA testing in the future.  Sonographic findings Di-di intrauterine pregnancy at 25w 5d  Presentation: A: Cephalic, B: Transverse, head to maternal right. Fetal cardiac activity:  A: Observed, B: Observed and appears normal. Abnormal anatomy: A: CPAM with CVR 0.21, suspected ovarian cyst B: UTDA1, absent nasal bone.  Fetal biometry (estimated fetal weight): A: 46 percentile, B: 88 percentile.  Placenta: A: Anterior, B: Fundal.       MVP: A: 4.39 cm, B: 10.71 cm.     Assessment - DM2 - TWIN A: CPAM, pelvic cyst - TWIN B: UTD, absent nasal bone - [redacted] weeks gestation - Polyhydramnios twin B Plan -Encouraged diabetic log to be brought to her next OB visit -Fetal echoes were completed and appeared normal per the report -Return ultrasound in 3 weeks to assess fetal growth and structures -Antenatal testing to start around 30 weeks due to uncontrolled diabetes in the settings of twins with known anomalies -Encouraged cell free DNA screening due to the presence of 2 soft markers and twin B and the CPAM and twin A.  Patient declined diagnostic test with amniocentesis -Serial growth ultrasounds every 3 to 4 weeks -Delivery timing per clinical course but likely around 36 to 37 weeks -NICU consultation should occur at  the time of delivery -Referral to pediatric surgery after to discuss removal of the CPAM.  Review of Systems: A review of systems was performed and was negative except per HPI   Vitals and Physical Exam    04/18/2023   11:17 AM 03/19/2023   10:37 AM 03/06/2023    9:34 AM  Vitals with BMI  Systolic 124 121 161  Diastolic 64 57 63  Pulse 96 103 93  Sitting comfortably on the sonogram table Nonlabored breathing Normal rate and rhythm Abdomen is nontender  Past pregnancies OB History  Gravida Para Term Preterm AB Living  5  2 2   2 2   SAB IAB Ectopic Multiple Live Births    2   0 2    # Outcome Date GA Lbr Len/2nd Weight Sex Type Anes PTL Lv  5 Current           4 Term 08/25/14 [redacted]w[redacted]d 14:43 / 00:36 8 lb 10.5 oz (3.925 kg) M Vag-Spont EPI  LIV  3 Term 08/31/06 [redacted]w[redacted]d   M Vag-Spont  Y LIV  2 IAB           1 IAB              I spent 45 minutes reviewing the patients chart, including labs and images as well as counseling the patient about her medical conditions. Greater than 50% of the time was spent in direct face-to-face patient counseling.  Braxton Feathers  MFM, [place]   04/18/2023  1:21 PM

## 2023-05-09 ENCOUNTER — Ambulatory Visit: Payer: Medicaid Other

## 2023-05-14 ENCOUNTER — Ambulatory Visit: Payer: Medicaid Other | Admitting: Dietician

## 2023-05-15 ENCOUNTER — Inpatient Hospital Stay (HOSPITAL_COMMUNITY)
Admission: AD | Admit: 2023-05-15 | Discharge: 2023-05-15 | Disposition: A | Discharge status: Home / Self Care | Payer: Medicaid Other | Attending: Obstetrics and Gynecology | Admitting: Obstetrics and Gynecology

## 2023-05-15 ENCOUNTER — Encounter (HOSPITAL_COMMUNITY): Payer: Self-pay | Admitting: Obstetrics and Gynecology

## 2023-05-15 ENCOUNTER — Other Ambulatory Visit: Payer: Self-pay

## 2023-05-15 DIAGNOSIS — O4703 False labor before 37 completed weeks of gestation, third trimester: Secondary | ICD-10-CM | POA: Diagnosis not present

## 2023-05-15 DIAGNOSIS — Z3689 Encounter for other specified antenatal screening: Secondary | ICD-10-CM

## 2023-05-15 DIAGNOSIS — O10919 Unspecified pre-existing hypertension complicating pregnancy, unspecified trimester: Secondary | ICD-10-CM

## 2023-05-15 DIAGNOSIS — Z3A29 29 weeks gestation of pregnancy: Secondary | ICD-10-CM

## 2023-05-15 DIAGNOSIS — O09523 Supervision of elderly multigravida, third trimester: Secondary | ICD-10-CM | POA: Diagnosis not present

## 2023-05-15 DIAGNOSIS — O368130 Decreased fetal movements, third trimester, not applicable or unspecified: Secondary | ICD-10-CM | POA: Insufficient documentation

## 2023-05-15 DIAGNOSIS — O30043 Twin pregnancy, dichorionic/diamniotic, third trimester: Secondary | ICD-10-CM

## 2023-05-15 LAB — URINALYSIS, ROUTINE W REFLEX MICROSCOPIC
Bilirubin Urine: NEGATIVE
Glucose, UA: NEGATIVE mg/dL
Hgb urine dipstick: NEGATIVE
Ketones, ur: NEGATIVE mg/dL
Leukocytes,Ua: NEGATIVE
Nitrite: NEGATIVE
Protein, ur: NEGATIVE mg/dL
Specific Gravity, Urine: 1.001 — ABNORMAL LOW (ref 1.005–1.030)
pH: 6 (ref 5.0–8.0)

## 2023-05-15 LAB — WET PREP, GENITAL
Clue Cells Wet Prep HPF POC: NONE SEEN
Sperm: NONE SEEN
Trich, Wet Prep: NONE SEEN
WBC, Wet Prep HPF POC: <10 (ref <10)
Yeast Wet Prep HPF POC: NONE SEEN

## 2023-05-15 MED ORDER — FENTANYL CITRATE (PF) 100 MCG/2ML IJ SOLN
50.0000 ug | Freq: Once | INTRAMUSCULAR | Status: AC
  Administered 2023-05-15: 50 ug via INTRAVENOUS
  Filled 2023-05-15: qty 2

## 2023-05-15 MED ORDER — NIFEDIPINE 10 MG PO CAPS
10.0000 mg | ORAL_CAPSULE | ORAL | Status: DC | PRN
  Administered 2023-05-15 (×3): 10 mg via ORAL
  Filled 2023-05-15 (×3): qty 1

## 2023-05-15 MED ORDER — LACTATED RINGERS IV BOLUS
1000.0000 mL | Freq: Once | INTRAVENOUS | Status: AC
  Administered 2023-05-15: 1000 mL via INTRAVENOUS

## 2023-05-15 NOTE — MAU Provider Note (Signed)
History     CSN: [id number]  Arrival date and time: 05/15/23 1552   Event Date/Time   First Provider Initiated Contact with Patient 05/15/23 1634      Chief Complaint  Patient presents with   Contractions   Decreased Fetal Movement   Erika Lewis is a 37 y.o. [id number] at [redacted]w[redacted]d who receives care at [facility].  She reports her next appt is next week. She presents today for contractions. She states she started having contractions yesterday at 2030.  She denies vaginal bleeding and discharge.  She reports fetal movement, but notes baby b is decreased.   OB History     Gravida  5   Para  2   Term  2   Preterm      AB  2   Living  2      SAB      IAB  2   Ectopic      Multiple  0   Live Births  2           Past Medical History:  Diagnosis Date   Anemia 03/30/2019   Asthma    Asthma 08/25/2014   Diabetes mellitus without complication (HCC)    Hx of varicella    Hypertension    Low grade squamous intraepithelial lesion (LGSIL) on cervicovaginal cytologic smear 03/30/2019   Sickle cell trait (HCC)    Subchorionic hematoma 12/19/2022   Moderate size, 4.3 x 3.1 x 2, noted at 7 1/7 week [place].   Uterine leiomyoma 08/25/2014    Past Surgical History:  Procedure Laterality Date   NO PAST SURGERIES      Family History  Problem Relation Age of Onset   Hypertension Mother    Diabetes Mother    Sickle cell trait Mother    Sickle cell trait Sister    Sickle cell trait Son    Sickle cell trait Maternal Aunt    Cancer Maternal Grandmother    Diabetes Maternal Grandmother    Hypertension Maternal Grandmother    Sickle cell trait Maternal Grandmother    Asthma Neg Hx    Heart disease Neg Hx     Social History   Tobacco Use   Smoking status: Never   Smokeless tobacco: Never  Vaping Use   Vaping status: Never Used  Substance Use Topics   Alcohol use: No   Drug use: No    Allergies: No Known Allergies  Medications Prior to Admission  Medication Sig  Dispense Refill Last Dose   acetaminophen (TYLENOL) 325 MG tablet Take 650 mg by mouth every 6 (six) hours as needed. (Patient not taking: Reported on 02/28/2023)      albuterol (PROVENTIL HFA;VENTOLIN HFA) 108 (90 Base) MCG/ACT inhaler Inhale 1-2 puffs into the lungs every 6 (six) hours as needed for wheezing or shortness of breath. 1 Inhaler 0    insulin aspart (NOVOLOG) 100 UNIT/ML injection Inject 20 Units into the skin at bedtime.      insulin glargine (LANTUS) 100 UNIT/ML injection Inject 10 Units into the skin with breakfast, with lunch, and with evening meal.      labetalol (NORMODYNE) 100 MG tablet Take 100 mg by mouth 2 (two) times daily.      Prenatal Vit-Fe Fumarate-FA (MULTIVITAMIN-PRENATAL) 27-0.8 MG TABS tablet Take 1 tablet by mouth daily at 12 noon.       Review of Systems  Gastrointestinal:  Positive for abdominal pain (Contractions). Negative for nausea and vomiting.  Genitourinary:  Negative  for difficulty urinating, dysuria, vaginal bleeding and vaginal discharge.  Neurological:  Negative for dizziness, light-headedness and headaches.   Physical Exam   Blood pressure (!) 145/73, pulse 100, temperature 98.3 F (36.8 C), temperature source Oral, resp. rate 18, height 5\' 4"  (1.626 m), weight 111.1 kg, last menstrual period 10/20/2022, SpO2 96%.  Vitals:   05/15/23 1608 05/15/23 1645 05/15/23 1700 05/15/23 1721  BP: (!) 145/73 (!) 148/76 138/85 (!) 148/81   05/15/23 1745 05/15/23 1800 05/15/23 1818 05/15/23 1830  BP: 120/86 138/75 139/60 130/67     Physical Exam Vitals reviewed. Exam conducted with a chaperone present Jeanice Lim, Charity fundraiser).  Constitutional:      Appearance: Normal appearance.  HENT:     Head: Normocephalic and atraumatic.  Eyes:     Conjunctiva/sclera: Conjunctivae normal.  Cardiovascular:     Pulses: Normal pulses.  Pulmonary:     Effort: Pulmonary effort is normal. No respiratory distress.  Genitourinary:    General: Normal vulva.     Comments:  Speculum Exam: -Normal External Genitalia: Non tender, no apparent discharge at introitus.  -Vaginal Vault: Pink mucosa with good rugae. Moderate amt white -wet prep collected -Cervix:Unable to visualize -Bimanual Exam: Dilation: Closed Effacement (%): Thick Exam by:: Gerrit Heck, CNM  Musculoskeletal:        General: Normal range of motion.     Cervical back: Normal range of motion.  Skin:    General: Skin is warm and dry.  Neurological:     Mental Status: She is alert and oriented to person, place, and time.  Psychiatric:        Mood and Affect: Mood normal.        Behavior: Behavior normal.     Fetal Assessment 130 bpm, Mod Var, -Decels, +15x15 Accels 135 bpm, Mod Var, -Decels, +15x15 Accels Toco: Q2-60min  MAU Course   Results for orders placed or performed during the hospital encounter of 05/15/23 (from the past 24 hour(s))  Urinalysis, Routine w reflex microscopic -Urine, Clean Catch     Status: Abnormal   Collection Time: 05/15/23  4:26 PM  Result Value Ref Range   Color, Urine COLORLESS (A) YELLOW   APPearance CLEAR CLEAR   Specific Gravity, Urine 1.001 (L) 1.005 - 1.030   pH 6.0 5.0 - 8.0   Glucose, UA NEGATIVE NEGATIVE mg/dL   Hgb urine dipstick NEGATIVE NEGATIVE   Bilirubin Urine NEGATIVE NEGATIVE   Ketones, ur NEGATIVE NEGATIVE mg/dL   Protein, ur NEGATIVE NEGATIVE mg/dL   Nitrite NEGATIVE NEGATIVE   Leukocytes,Ua NEGATIVE NEGATIVE  Wet prep, genital     Status: None   Collection Time: 05/15/23  4:48 PM   Specimen: Vaginal  Result Value Ref Range   Yeast Wet Prep HPF POC NONE SEEN NONE SEEN   Trich, Wet Prep NONE SEEN NONE SEEN   Clue Cells Wet Prep HPF POC NONE SEEN NONE SEEN   WBC, Wet Prep HPF POC <10 <10   Sperm NONE SEEN    No results found.  MDM PE Labs: UA, Wet Prep EFM Start IV LR Bolus Tocolytic Assessment and Plan  37 year old [id number]  SIUP at 29.4 weeks Cat I FT Preterm Contractions  -POC Reviewed. -Exam performed and   findings discussed. -Start IV and give fluids. -Discussed starting procardia if no improvement after infused.  -Patient agreeable and requesting pain medication. -Give fentanyl now and reassess.   Cherre Robins MSN, CNM 05/15/2023, 4:34 PM   Reassessment (5:21 PM) -Patient  reports continued pain despite fentanyl. -Will start Procardia protocol.   Reassessment (6:25 PM) -Patient reports improvement in contractions and pain s/p procardia x 4 doses.  -Endorses fetal movement. -Discussed follow up as scheduled. -Instructed to take procardia at home. -Return precautions reviewed. -Encouraged to call primary office or return to MAU if symptoms worsen or with the onset of new symptoms. -Discharged to home in improved condition.  Cherre Robins MSN, CNM Advanced Practice Provider, [facility]

## 2023-05-15 NOTE — MAU Note (Signed)
Erika Lewis is a 37 y.o. at [redacted]w[redacted]d here in MAU reporting: she began having pain last night, thought it was [employer] ctxs, but now states she thinks she's contracting consistently.  Also reports twin B hasn't been moving as often today.  Denies VB or LOF.  Reports twin A has been moving. LMP: NA Onset of complaint: last night Pain score: 9 Vitals:   05/15/23 1608  BP: (!) 145/73  Pulse: 100  Resp: 18  Temp: 98.3 F (36.8 C)  SpO2: 96%     [id number] d/t maternal apparel Lab orders placed from triage:   UA

## 2023-05-16 ENCOUNTER — Ambulatory Visit: Payer: Medicaid Other | Admitting: Endocrinology

## 2023-05-16 LAB — GC/CHLAMYDIA PROBE AMP (~~LOC~~) NOT AT ARMC
Chlamydia: NEGATIVE
Comment: NEGATIVE
Comment: NORMAL
Neisseria Gonorrhea: NEGATIVE

## 2023-05-22 ENCOUNTER — Ambulatory Visit: Payer: Medicaid Other | Admitting: *Deleted

## 2023-05-22 ENCOUNTER — Ambulatory Visit: Payer: Medicaid Other | Attending: Maternal & Fetal Medicine

## 2023-05-22 ENCOUNTER — Other Ambulatory Visit: Payer: Self-pay | Admitting: *Deleted

## 2023-05-22 VITALS — BP 134/70 | HR 105

## 2023-05-22 DIAGNOSIS — O403XX2 Polyhydramnios, third trimester, fetus 2: Secondary | ICD-10-CM | POA: Diagnosis not present

## 2023-05-22 DIAGNOSIS — E119 Type 2 diabetes mellitus without complications: Secondary | ICD-10-CM

## 2023-05-22 DIAGNOSIS — O30043 Twin pregnancy, dichorionic/diamniotic, third trimester: Secondary | ICD-10-CM | POA: Diagnosis not present

## 2023-05-22 DIAGNOSIS — O24113 Pre-existing diabetes mellitus, type 2, in pregnancy, third trimester: Secondary | ICD-10-CM | POA: Diagnosis not present

## 2023-05-22 DIAGNOSIS — D573 Sickle-cell trait: Secondary | ICD-10-CM

## 2023-05-22 DIAGNOSIS — O30042 Twin pregnancy, dichorionic/diamniotic, second trimester: Secondary | ICD-10-CM | POA: Diagnosis present

## 2023-05-22 DIAGNOSIS — E669 Obesity, unspecified: Secondary | ICD-10-CM

## 2023-05-22 DIAGNOSIS — O09523 Supervision of elderly multigravida, third trimester: Secondary | ICD-10-CM

## 2023-05-22 DIAGNOSIS — O99213 Obesity complicating pregnancy, third trimester: Secondary | ICD-10-CM

## 2023-05-22 DIAGNOSIS — Q330 Congenital cystic lung: Secondary | ICD-10-CM

## 2023-05-22 DIAGNOSIS — O10913 Unspecified pre-existing hypertension complicating pregnancy, third trimester: Secondary | ICD-10-CM | POA: Insufficient documentation

## 2023-05-22 DIAGNOSIS — O409XX0 Polyhydramnios, unspecified trimester, not applicable or unspecified: Secondary | ICD-10-CM

## 2023-05-22 DIAGNOSIS — O24112 Pre-existing diabetes mellitus, type 2, in pregnancy, second trimester: Secondary | ICD-10-CM | POA: Insufficient documentation

## 2023-05-22 DIAGNOSIS — Z3A30 30 weeks gestation of pregnancy: Secondary | ICD-10-CM

## 2023-05-22 DIAGNOSIS — O35EXX0 Maternal care for other (suspected) fetal abnormality and damage, fetal genitourinary anomalies, not applicable or unspecified: Secondary | ICD-10-CM | POA: Diagnosis not present

## 2023-05-22 DIAGNOSIS — O35CXX1 Maternal care for other (suspected) fetal abnormality and damage, fetal pulmonary anomalies, fetus 1: Secondary | ICD-10-CM

## 2023-05-29 ENCOUNTER — Encounter: Payer: Self-pay | Admitting: *Deleted

## 2023-05-29 DIAGNOSIS — O283 Abnormal ultrasonic finding on antenatal screening of mother: Secondary | ICD-10-CM | POA: Insufficient documentation

## 2023-05-29 DIAGNOSIS — D259 Leiomyoma of uterus, unspecified: Secondary | ICD-10-CM | POA: Insufficient documentation

## 2023-05-30 ENCOUNTER — Ambulatory Visit: Payer: Medicaid Other | Attending: Obstetrics

## 2023-05-30 ENCOUNTER — Ambulatory Visit: Payer: Medicaid Other | Admitting: *Deleted

## 2023-05-30 DIAGNOSIS — O09523 Supervision of elderly multigravida, third trimester: Secondary | ICD-10-CM

## 2023-05-30 DIAGNOSIS — O409XX0 Polyhydramnios, unspecified trimester, not applicable or unspecified: Secondary | ICD-10-CM

## 2023-05-30 DIAGNOSIS — O24113 Pre-existing diabetes mellitus, type 2, in pregnancy, third trimester: Secondary | ICD-10-CM

## 2023-05-30 DIAGNOSIS — O35CXX1 Maternal care for other (suspected) fetal abnormality and damage, fetal pulmonary anomalies, fetus 1: Secondary | ICD-10-CM

## 2023-05-30 DIAGNOSIS — O99213 Obesity complicating pregnancy, third trimester: Secondary | ICD-10-CM

## 2023-05-30 DIAGNOSIS — E1169 Type 2 diabetes mellitus with other specified complication: Secondary | ICD-10-CM | POA: Diagnosis not present

## 2023-05-30 DIAGNOSIS — E669 Obesity, unspecified: Secondary | ICD-10-CM

## 2023-05-30 DIAGNOSIS — O30043 Twin pregnancy, dichorionic/diamniotic, third trimester: Secondary | ICD-10-CM

## 2023-05-30 DIAGNOSIS — D573 Sickle-cell trait: Secondary | ICD-10-CM

## 2023-05-30 DIAGNOSIS — Z3A31 31 weeks gestation of pregnancy: Secondary | ICD-10-CM

## 2023-05-30 DIAGNOSIS — Q330 Congenital cystic lung: Secondary | ICD-10-CM

## 2023-05-30 DIAGNOSIS — O99013 Anemia complicating pregnancy, third trimester: Secondary | ICD-10-CM

## 2023-05-30 DIAGNOSIS — O403XX2 Polyhydramnios, third trimester, fetus 2: Secondary | ICD-10-CM

## 2023-06-05 ENCOUNTER — Ambulatory Visit: Payer: Medicaid Other | Attending: Obstetrics

## 2023-06-05 ENCOUNTER — Ambulatory Visit: Payer: Medicaid Other | Admitting: *Deleted

## 2023-06-05 ENCOUNTER — Other Ambulatory Visit: Payer: Self-pay | Admitting: *Deleted

## 2023-06-05 VITALS — BP 149/73 | HR 94

## 2023-06-05 DIAGNOSIS — E669 Obesity, unspecified: Secondary | ICD-10-CM

## 2023-06-05 DIAGNOSIS — O403XX2 Polyhydramnios, third trimester, fetus 2: Secondary | ICD-10-CM

## 2023-06-05 DIAGNOSIS — O99013 Anemia complicating pregnancy, third trimester: Secondary | ICD-10-CM

## 2023-06-05 DIAGNOSIS — O10013 Pre-existing essential hypertension complicating pregnancy, third trimester: Secondary | ICD-10-CM | POA: Diagnosis not present

## 2023-06-05 DIAGNOSIS — Q330 Congenital cystic lung: Secondary | ICD-10-CM

## 2023-06-05 DIAGNOSIS — O99213 Obesity complicating pregnancy, third trimester: Secondary | ICD-10-CM

## 2023-06-05 DIAGNOSIS — E1169 Type 2 diabetes mellitus with other specified complication: Secondary | ICD-10-CM | POA: Diagnosis not present

## 2023-06-05 DIAGNOSIS — O30043 Twin pregnancy, dichorionic/diamniotic, third trimester: Secondary | ICD-10-CM | POA: Diagnosis present

## 2023-06-05 DIAGNOSIS — O10913 Unspecified pre-existing hypertension complicating pregnancy, third trimester: Secondary | ICD-10-CM | POA: Diagnosis present

## 2023-06-05 DIAGNOSIS — O409XX0 Polyhydramnios, unspecified trimester, not applicable or unspecified: Secondary | ICD-10-CM | POA: Diagnosis present

## 2023-06-05 DIAGNOSIS — O24113 Pre-existing diabetes mellitus, type 2, in pregnancy, third trimester: Secondary | ICD-10-CM | POA: Diagnosis not present

## 2023-06-05 DIAGNOSIS — D573 Sickle-cell trait: Secondary | ICD-10-CM

## 2023-06-05 DIAGNOSIS — Z3A32 32 weeks gestation of pregnancy: Secondary | ICD-10-CM

## 2023-06-05 DIAGNOSIS — O09523 Supervision of elderly multigravida, third trimester: Secondary | ICD-10-CM

## 2023-06-12 ENCOUNTER — Ambulatory Visit: Payer: Medicaid Other

## 2023-06-12 ENCOUNTER — Ambulatory Visit: Payer: Medicaid Other | Attending: Obstetrics

## 2023-06-12 VITALS — BP 125/75 | HR 101

## 2023-06-12 DIAGNOSIS — O403XX2 Polyhydramnios, third trimester, fetus 2: Secondary | ICD-10-CM | POA: Diagnosis not present

## 2023-06-12 DIAGNOSIS — O30043 Twin pregnancy, dichorionic/diamniotic, third trimester: Secondary | ICD-10-CM | POA: Diagnosis present

## 2023-06-12 DIAGNOSIS — O409XX0 Polyhydramnios, unspecified trimester, not applicable or unspecified: Secondary | ICD-10-CM

## 2023-06-12 DIAGNOSIS — O10013 Pre-existing essential hypertension complicating pregnancy, third trimester: Secondary | ICD-10-CM | POA: Diagnosis not present

## 2023-06-12 DIAGNOSIS — O99013 Anemia complicating pregnancy, third trimester: Secondary | ICD-10-CM

## 2023-06-12 DIAGNOSIS — O24113 Pre-existing diabetes mellitus, type 2, in pregnancy, third trimester: Secondary | ICD-10-CM

## 2023-06-12 DIAGNOSIS — O09523 Supervision of elderly multigravida, third trimester: Secondary | ICD-10-CM

## 2023-06-12 DIAGNOSIS — D573 Sickle-cell trait: Secondary | ICD-10-CM

## 2023-06-12 DIAGNOSIS — Z3A33 33 weeks gestation of pregnancy: Secondary | ICD-10-CM

## 2023-06-12 DIAGNOSIS — O10913 Unspecified pre-existing hypertension complicating pregnancy, third trimester: Secondary | ICD-10-CM | POA: Diagnosis present

## 2023-06-12 DIAGNOSIS — Q330 Congenital cystic lung: Secondary | ICD-10-CM

## 2023-06-12 DIAGNOSIS — E1169 Type 2 diabetes mellitus with other specified complication: Secondary | ICD-10-CM | POA: Diagnosis not present

## 2023-06-12 DIAGNOSIS — E669 Obesity, unspecified: Secondary | ICD-10-CM

## 2023-06-12 DIAGNOSIS — O99213 Obesity complicating pregnancy, third trimester: Secondary | ICD-10-CM

## 2023-06-20 ENCOUNTER — Ambulatory Visit: Payer: Medicaid Other | Attending: Obstetrics | Admitting: *Deleted

## 2023-06-20 ENCOUNTER — Ambulatory Visit (HOSPITAL_BASED_OUTPATIENT_CLINIC_OR_DEPARTMENT_OTHER): Payer: Medicaid Other

## 2023-06-20 VITALS — BP 147/71 | HR 88

## 2023-06-20 DIAGNOSIS — E669 Obesity, unspecified: Secondary | ICD-10-CM

## 2023-06-20 DIAGNOSIS — O30043 Twin pregnancy, dichorionic/diamniotic, third trimester: Secondary | ICD-10-CM

## 2023-06-20 DIAGNOSIS — D573 Sickle-cell trait: Secondary | ICD-10-CM | POA: Diagnosis not present

## 2023-06-20 DIAGNOSIS — O403XX2 Polyhydramnios, third trimester, fetus 2: Secondary | ICD-10-CM | POA: Insufficient documentation

## 2023-06-20 DIAGNOSIS — O10013 Pre-existing essential hypertension complicating pregnancy, third trimester: Secondary | ICD-10-CM

## 2023-06-20 DIAGNOSIS — Z3A34 34 weeks gestation of pregnancy: Secondary | ICD-10-CM | POA: Insufficient documentation

## 2023-06-20 DIAGNOSIS — O09523 Supervision of elderly multigravida, third trimester: Secondary | ICD-10-CM | POA: Insufficient documentation

## 2023-06-20 DIAGNOSIS — O24113 Pre-existing diabetes mellitus, type 2, in pregnancy, third trimester: Secondary | ICD-10-CM | POA: Insufficient documentation

## 2023-06-20 DIAGNOSIS — O99213 Obesity complicating pregnancy, third trimester: Secondary | ICD-10-CM

## 2023-06-20 DIAGNOSIS — Q330 Congenital cystic lung: Secondary | ICD-10-CM | POA: Insufficient documentation

## 2023-06-20 DIAGNOSIS — Z862 Personal history of diseases of the blood and blood-forming organs and certain disorders involving the immune mechanism: Secondary | ICD-10-CM | POA: Insufficient documentation

## 2023-06-20 DIAGNOSIS — O35CXX1 Maternal care for other (suspected) fetal abnormality and damage, fetal pulmonary anomalies, fetus 1: Secondary | ICD-10-CM

## 2023-06-20 DIAGNOSIS — O403XX0 Polyhydramnios, third trimester, not applicable or unspecified: Secondary | ICD-10-CM

## 2023-06-20 DIAGNOSIS — O30003 Twin pregnancy, unspecified number of placenta and unspecified number of amniotic sacs, third trimester: Secondary | ICD-10-CM

## 2023-06-20 DIAGNOSIS — O289 Unspecified abnormal findings on antenatal screening of mother: Secondary | ICD-10-CM | POA: Insufficient documentation

## 2023-06-20 DIAGNOSIS — E1169 Type 2 diabetes mellitus with other specified complication: Secondary | ICD-10-CM

## 2023-06-20 DIAGNOSIS — O409XX0 Polyhydramnios, unspecified trimester, not applicable or unspecified: Secondary | ICD-10-CM | POA: Diagnosis present

## 2023-06-24 ENCOUNTER — Other Ambulatory Visit: Payer: Self-pay | Admitting: Obstetrics and Gynecology

## 2023-06-25 ENCOUNTER — Ambulatory Visit: Payer: Medicaid Other

## 2023-06-25 ENCOUNTER — Encounter (HOSPITAL_COMMUNITY): Payer: Self-pay

## 2023-06-25 ENCOUNTER — Telehealth (HOSPITAL_COMMUNITY): Payer: Self-pay | Admitting: *Deleted

## 2023-06-25 NOTE — Telephone Encounter (Signed)
Preadmission screen  

## 2023-06-25 NOTE — Patient Instructions (Signed)
Erika Lewis  06/25/2023   Your procedure is scheduled on:  06/29/2023  Arrive at 0930 at Entrance C on [address] at [facility]  and [employer]. You are invited to use the FREE valet parking or use the Visitor's parking deck.  Pick up the phone at the desk and dial [phone].  Call this number if you have problems the morning of surgery: [phone]  Remember:   Do not eat food:(After Midnight) Desps de medianoche.  Do not drink clear liquids: (After Midnight) Desps de medianoche.  Take these medicines the morning of surgery with A SIP OF WATER:  nifedipine   Do not wear jewelry, make-up or nail polish.  Do not wear lotions, powders, or perfumes. Do not wear deodorant.  Do not shave 48 hours prior to surgery.  Do not bring valuables to the hospital.  [facility] is not   responsible for any belongings or valuables brought to the hospital.  Contacts, dentures or bridgework may not be worn into surgery.  Leave suitcase in the car. After surgery it may be brought to your room.  For patients admitted to the hospital, checkout time is 11:00 AM the day of              discharge.      Please read over the following fact sheets that you were given:     Preparing for Surgery

## 2023-06-26 ENCOUNTER — Telehealth (HOSPITAL_COMMUNITY): Payer: Self-pay | Admitting: *Deleted

## 2023-06-26 ENCOUNTER — Encounter (HOSPITAL_COMMUNITY): Payer: Self-pay

## 2023-06-26 NOTE — Telephone Encounter (Signed)
Preadmission screen  

## 2023-06-27 ENCOUNTER — Ambulatory Visit: Payer: Medicaid Other | Admitting: *Deleted

## 2023-06-27 ENCOUNTER — Ambulatory Visit: Payer: Medicaid Other | Attending: Obstetrics and Gynecology

## 2023-06-27 ENCOUNTER — Ambulatory Visit: Payer: Medicaid Other | Attending: Obstetrics | Admitting: Obstetrics

## 2023-06-27 VITALS — BP 136/66 | HR 87

## 2023-06-27 DIAGNOSIS — O99213 Obesity complicating pregnancy, third trimester: Secondary | ICD-10-CM

## 2023-06-27 DIAGNOSIS — Z3A35 35 weeks gestation of pregnancy: Secondary | ICD-10-CM

## 2023-06-27 DIAGNOSIS — O10913 Unspecified pre-existing hypertension complicating pregnancy, third trimester: Secondary | ICD-10-CM | POA: Insufficient documentation

## 2023-06-27 DIAGNOSIS — O24319 Unspecified pre-existing diabetes mellitus in pregnancy, unspecified trimester: Secondary | ICD-10-CM | POA: Diagnosis not present

## 2023-06-27 DIAGNOSIS — O365930 Maternal care for other known or suspected poor fetal growth, third trimester, not applicable or unspecified: Secondary | ICD-10-CM | POA: Diagnosis not present

## 2023-06-27 DIAGNOSIS — D573 Sickle-cell trait: Secondary | ICD-10-CM

## 2023-06-27 DIAGNOSIS — O403XX2 Polyhydramnios, third trimester, fetus 2: Secondary | ICD-10-CM

## 2023-06-27 DIAGNOSIS — O30043 Twin pregnancy, dichorionic/diamniotic, third trimester: Secondary | ICD-10-CM | POA: Insufficient documentation

## 2023-06-27 DIAGNOSIS — E669 Obesity, unspecified: Secondary | ICD-10-CM

## 2023-06-27 DIAGNOSIS — E119 Type 2 diabetes mellitus without complications: Secondary | ICD-10-CM

## 2023-06-27 DIAGNOSIS — O24113 Pre-existing diabetes mellitus, type 2, in pregnancy, third trimester: Secondary | ICD-10-CM | POA: Diagnosis not present

## 2023-06-27 DIAGNOSIS — O10013 Pre-existing essential hypertension complicating pregnancy, third trimester: Secondary | ICD-10-CM

## 2023-06-27 DIAGNOSIS — O09523 Supervision of elderly multigravida, third trimester: Secondary | ICD-10-CM

## 2023-06-27 DIAGNOSIS — O35CXX1 Maternal care for other (suspected) fetal abnormality and damage, fetal pulmonary anomalies, fetus 1: Secondary | ICD-10-CM | POA: Diagnosis not present

## 2023-06-27 NOTE — Progress Notes (Signed)
MFM Note  Erika Lewis was seen for a BPP and umbilical artery Doppler studies due to a dichorionic, diamniotic twin gestation with borderline IUGR of twin A, diabetes, and chronic hypertension treated with nifedipine.  She is currently at 35 weeks and 5 days.  She denies any problems since her last exam and reports feeling fetal movements of both fetuses throughout the day.  Both fetuses remain in the breech presentations.    Twin A appears to be visually smaller than twin B.  There was normal amniotic fluid noted around both fetuses.  A BPP performed today was 8 out of 8 for both twin A and twin B.  Doppler studies of the umbilical arteries performed for twin A showed an increased S/D ratio.    Doppler studies of the umbilical arteries for twin B showed a normal S/D ratio.  There were no signs of absent or reversed end-diastolic flow noted in either fetus.  She already has a C-section scheduled on June 29, 2023 (in 2 days).  The patient was advised that there is a possibility that due to the small size of twin A, a NICU admission may be necessary after delivery.    Twin A should have a chest x-ray after birth to determine if the chest mass noted earlier in her pregnancy is still present.  The patient and her partner stated that all of their questions were answered today.  A total of 20 minutes was spent counseling and coordinating the care for this patient.  Greater than 50% of the time was spent in direct face-to-face contact.

## 2023-06-28 ENCOUNTER — Encounter (HOSPITAL_COMMUNITY)
Admission: RE | Admit: 2023-06-28 | Discharge: 2023-06-28 | Disposition: A | Discharge status: Home / Self Care | Payer: Medicaid Other | Source: Ambulatory Visit | Attending: Obstetrics and Gynecology | Admitting: Obstetrics and Gynecology

## 2023-06-28 DIAGNOSIS — O30043 Twin pregnancy, dichorionic/diamniotic, third trimester: Secondary | ICD-10-CM | POA: Insufficient documentation

## 2023-06-28 DIAGNOSIS — Z3A35 35 weeks gestation of pregnancy: Secondary | ICD-10-CM | POA: Insufficient documentation

## 2023-06-28 DIAGNOSIS — Z01812 Encounter for preprocedural laboratory examination: Secondary | ICD-10-CM | POA: Insufficient documentation

## 2023-06-28 LAB — CBC
HCT: 35.9 % — ABNORMAL LOW (ref 36.0–46.0)
Hemoglobin: 10.9 g/dL — ABNORMAL LOW (ref 12.0–15.0)
MCH: 24.4 pg — ABNORMAL LOW (ref 26.0–34.0)
MCHC: 30.4 g/dL (ref 30.0–36.0)
MCV: 80.5 fL (ref 80.0–100.0)
Platelets: 187 10*3/uL (ref 150–400)
RBC: 4.46 MIL/uL (ref 3.87–5.11)
RDW: 28.9 % — ABNORMAL HIGH (ref 11.5–15.5)
WBC: 6.6 10*3/uL (ref 4.0–10.5)
nRBC: 1.4 % — ABNORMAL HIGH (ref 0.0–0.2)

## 2023-06-28 LAB — TYPE AND SCREEN
ABO/RH(D): A POS
Antibody Screen: NEGATIVE

## 2023-06-28 NOTE — Anesthesia Preprocedure Evaluation (Signed)
Anesthesia Evaluation  Patient identified by MRN, date of birth, ID band Patient awake    Reviewed: Allergy & Precautions, NPO status , Patient's Chart, lab work & pertinent test results  Airway Mallampati: IV  TM Distance: >3 FB Neck ROM: Full    Dental  (+) Teeth Intact, Dental Advisory Given   Pulmonary asthma (last use albuterol this AM, uses at least a few times a week)    Pulmonary exam normal breath sounds clear to auscultation       Cardiovascular hypertension (155/88 preop), Pt. on medications Normal cardiovascular exam Rhythm:Regular Rate:Normal     Neuro/Psych negative neurological ROS  negative psych ROS   GI/Hepatic negative GI ROS, Neg liver ROS,,,  Endo/Other  diabetes, Well Controlled, Type 2, Insulin Dependent  Morbid obesityBMI 43 A1c 6.2 FS 93 preop  Renal/GU negative Renal ROS  negative genitourinary   Musculoskeletal negative musculoskeletal ROS (+)    Abdominal  (+) + obese  Peds  Hematology  (+) Blood dyscrasia, Sickle cell trait   Anesthesia Other Findings   Reproductive/Obstetrics negative OB ROS                             Anesthesia Physical Anesthesia Plan  ASA: 3  Anesthesia Plan: Spinal   Post-op Pain Management: Regional block, Ofirmev IV (intra-op)* and Toradol IV (intra-op)*   Induction:   PONV Risk Score and Plan: 3 and Ondansetron, Dexamethasone and Treatment may vary due to age or medical condition  Airway Management Planned: Natural Airway and Nasal Cannula  Additional Equipment: None  Intra-op Plan:   Post-operative Plan:   Informed Consent: I have reviewed the patients History and Physical, chart, labs and discussed the procedure including the risks, benefits and alternatives for the proposed anesthesia with the patient or authorized representative who has indicated his/her understanding and acceptance.       Plan Discussed with:  CRNA  Anesthesia Plan Comments: (Dexcom R arm- reads to app on cell phone, will bring back to OR w/ [place] )       Anesthesia Quick Evaluation

## 2023-06-29 ENCOUNTER — Encounter (HOSPITAL_COMMUNITY): Payer: Self-pay | Admitting: Obstetrics and Gynecology

## 2023-06-29 ENCOUNTER — Encounter (HOSPITAL_COMMUNITY)
Admission: RE | Disposition: A | Discharge status: Home / Self Care | Payer: Self-pay | Source: Home / Self Care | Attending: Obstetrics and Gynecology

## 2023-06-29 ENCOUNTER — Other Ambulatory Visit: Payer: Self-pay

## 2023-06-29 ENCOUNTER — Inpatient Hospital Stay (HOSPITAL_COMMUNITY): Payer: Medicaid Other | Admitting: Anesthesiology

## 2023-06-29 ENCOUNTER — Inpatient Hospital Stay (HOSPITAL_COMMUNITY)
Admission: RE | Admit: 2023-06-29 | Discharge: 2023-07-10 | DRG: 783 | Disposition: A | Discharge status: Home / Self Care | Payer: Medicaid Other | Attending: Obstetrics and Gynecology | Admitting: Obstetrics and Gynecology

## 2023-06-29 DIAGNOSIS — O321XX1 Maternal care for breech presentation, fetus 1: Secondary | ICD-10-CM | POA: Diagnosis present

## 2023-06-29 DIAGNOSIS — K559 Vascular disorder of intestine, unspecified: Secondary | ICD-10-CM | POA: Diagnosis present

## 2023-06-29 DIAGNOSIS — K567 Ileus, unspecified: Secondary | ICD-10-CM | POA: Diagnosis not present

## 2023-06-29 DIAGNOSIS — O99284 Endocrine, nutritional and metabolic diseases complicating childbirth: Secondary | ICD-10-CM | POA: Diagnosis present

## 2023-06-29 DIAGNOSIS — O9963 Diseases of the digestive system complicating the puerperium: Secondary | ICD-10-CM | POA: Diagnosis not present

## 2023-06-29 DIAGNOSIS — O99214 Obesity complicating childbirth: Secondary | ICD-10-CM | POA: Diagnosis present

## 2023-06-29 DIAGNOSIS — Z3A36 36 weeks gestation of pregnancy: Secondary | ICD-10-CM

## 2023-06-29 DIAGNOSIS — O358XX2 Maternal care for other (suspected) fetal abnormality and damage, fetus 2: Secondary | ICD-10-CM | POA: Diagnosis present

## 2023-06-29 DIAGNOSIS — O1092 Unspecified pre-existing hypertension complicating childbirth: Secondary | ICD-10-CM | POA: Diagnosis present

## 2023-06-29 DIAGNOSIS — I517 Cardiomegaly: Secondary | ICD-10-CM | POA: Diagnosis not present

## 2023-06-29 DIAGNOSIS — O9942 Diseases of the circulatory system complicating childbirth: Secondary | ICD-10-CM | POA: Diagnosis present

## 2023-06-29 DIAGNOSIS — Z1152 Encounter for screening for COVID-19: Secondary | ICD-10-CM

## 2023-06-29 DIAGNOSIS — O329XX1 Maternal care for malpresentation of fetus, unspecified, fetus 1: Secondary | ICD-10-CM

## 2023-06-29 DIAGNOSIS — O10919 Unspecified pre-existing hypertension complicating pregnancy, unspecified trimester: Principal | ICD-10-CM | POA: Diagnosis present

## 2023-06-29 DIAGNOSIS — J690 Pneumonitis due to inhalation of food and vomit: Secondary | ICD-10-CM | POA: Diagnosis not present

## 2023-06-29 DIAGNOSIS — Z8249 Family history of ischemic heart disease and other diseases of the circulatory system: Secondary | ICD-10-CM

## 2023-06-29 DIAGNOSIS — Z148 Genetic carrier of other disease: Secondary | ICD-10-CM

## 2023-06-29 DIAGNOSIS — J121 Respiratory syncytial virus pneumonia: Secondary | ICD-10-CM

## 2023-06-29 DIAGNOSIS — Z794 Long term (current) use of insulin: Secondary | ICD-10-CM

## 2023-06-29 DIAGNOSIS — O3413 Maternal care for benign tumor of corpus uteri, third trimester: Secondary | ICD-10-CM | POA: Diagnosis present

## 2023-06-29 DIAGNOSIS — O321XX2 Maternal care for breech presentation, fetus 2: Secondary | ICD-10-CM | POA: Diagnosis not present

## 2023-06-29 DIAGNOSIS — Z832 Family history of diseases of the blood and blood-forming organs and certain disorders involving the immune mechanism: Secondary | ICD-10-CM

## 2023-06-29 DIAGNOSIS — O99893 Other specified diseases and conditions complicating puerperium: Secondary | ICD-10-CM | POA: Diagnosis not present

## 2023-06-29 DIAGNOSIS — K9189 Other postprocedural complications and disorders of digestive system: Secondary | ICD-10-CM | POA: Diagnosis not present

## 2023-06-29 DIAGNOSIS — E119 Type 2 diabetes mellitus without complications: Secondary | ICD-10-CM | POA: Diagnosis present

## 2023-06-29 DIAGNOSIS — O9081 Anemia of the puerperium: Secondary | ICD-10-CM | POA: Diagnosis not present

## 2023-06-29 DIAGNOSIS — O9852 Other viral diseases complicating childbirth: Secondary | ICD-10-CM | POA: Diagnosis present

## 2023-06-29 DIAGNOSIS — E860 Dehydration: Secondary | ICD-10-CM | POA: Diagnosis not present

## 2023-06-29 DIAGNOSIS — R000 Tachycardia, unspecified: Secondary | ICD-10-CM | POA: Diagnosis not present

## 2023-06-29 DIAGNOSIS — B9789 Other viral agents as the cause of diseases classified elsewhere: Secondary | ICD-10-CM | POA: Diagnosis not present

## 2023-06-29 DIAGNOSIS — E8809 Other disorders of plasma-protein metabolism, not elsewhere classified: Secondary | ICD-10-CM | POA: Diagnosis present

## 2023-06-29 DIAGNOSIS — O2412 Pre-existing diabetes mellitus, type 2, in childbirth: Secondary | ICD-10-CM | POA: Diagnosis present

## 2023-06-29 DIAGNOSIS — D62 Acute posthemorrhagic anemia: Secondary | ICD-10-CM | POA: Diagnosis not present

## 2023-06-29 DIAGNOSIS — O99824 Streptococcus B carrier state complicating childbirth: Secondary | ICD-10-CM | POA: Diagnosis present

## 2023-06-29 DIAGNOSIS — O8612 Endometritis following delivery: Secondary | ICD-10-CM | POA: Diagnosis not present

## 2023-06-29 DIAGNOSIS — Z7951 Long term (current) use of inhaled steroids: Secondary | ICD-10-CM

## 2023-06-29 DIAGNOSIS — Z8481 Family history of carrier of genetic disease: Secondary | ICD-10-CM

## 2023-06-29 DIAGNOSIS — O329XX2 Maternal care for malpresentation of fetus, unspecified, fetus 2: Secondary | ICD-10-CM | POA: Diagnosis not present

## 2023-06-29 DIAGNOSIS — Z302 Encounter for sterilization: Secondary | ICD-10-CM

## 2023-06-29 DIAGNOSIS — K56609 Unspecified intestinal obstruction, unspecified as to partial versus complete obstruction: Secondary | ICD-10-CM

## 2023-06-29 DIAGNOSIS — R0989 Other specified symptoms and signs involving the circulatory and respiratory systems: Secondary | ICD-10-CM | POA: Diagnosis not present

## 2023-06-29 DIAGNOSIS — O9952 Diseases of the respiratory system complicating childbirth: Secondary | ICD-10-CM | POA: Diagnosis present

## 2023-06-29 DIAGNOSIS — B348 Other viral infections of unspecified site: Secondary | ICD-10-CM | POA: Diagnosis not present

## 2023-06-29 DIAGNOSIS — O30043 Twin pregnancy, dichorionic/diamniotic, third trimester: Secondary | ICD-10-CM | POA: Diagnosis present

## 2023-06-29 DIAGNOSIS — O99820 Streptococcus B carrier state complicating pregnancy: Secondary | ICD-10-CM | POA: Diagnosis not present

## 2023-06-29 DIAGNOSIS — D259 Leiomyoma of uterus, unspecified: Secondary | ICD-10-CM | POA: Diagnosis present

## 2023-06-29 DIAGNOSIS — O9049 Other postpartum acute kidney failure: Secondary | ICD-10-CM | POA: Diagnosis not present

## 2023-06-29 DIAGNOSIS — J45909 Unspecified asthma, uncomplicated: Secondary | ICD-10-CM | POA: Diagnosis present

## 2023-06-29 DIAGNOSIS — D573 Sickle-cell trait: Secondary | ICD-10-CM | POA: Diagnosis present

## 2023-06-29 DIAGNOSIS — E876 Hypokalemia: Secondary | ICD-10-CM | POA: Diagnosis not present

## 2023-06-29 DIAGNOSIS — Z01812 Encounter for preprocedural laboratory examination: Secondary | ICD-10-CM

## 2023-06-29 DIAGNOSIS — N179 Acute kidney failure, unspecified: Secondary | ICD-10-CM | POA: Diagnosis not present

## 2023-06-29 DIAGNOSIS — Z98891 History of uterine scar from previous surgery: Principal | ICD-10-CM

## 2023-06-29 DIAGNOSIS — O1002 Pre-existing essential hypertension complicating childbirth: Secondary | ICD-10-CM | POA: Diagnosis not present

## 2023-06-29 DIAGNOSIS — E870 Hyperosmolality and hypernatremia: Secondary | ICD-10-CM | POA: Diagnosis present

## 2023-06-29 DIAGNOSIS — Z833 Family history of diabetes mellitus: Secondary | ICD-10-CM

## 2023-06-29 DIAGNOSIS — O358XX1 Maternal care for other (suspected) fetal abnormality and damage, fetus 1: Secondary | ICD-10-CM | POA: Diagnosis present

## 2023-06-29 LAB — CBC
HCT: 28.6 % — ABNORMAL LOW (ref 36.0–46.0)
Hemoglobin: 9.1 g/dL — ABNORMAL LOW (ref 12.0–15.0)
MCH: 25.1 pg — ABNORMAL LOW (ref 26.0–34.0)
MCHC: 31.8 g/dL (ref 30.0–36.0)
MCV: 78.8 fL — ABNORMAL LOW (ref 80.0–100.0)
Platelets: 143 10*3/uL — ABNORMAL LOW (ref 150–400)
RBC: 3.63 MIL/uL — ABNORMAL LOW (ref 3.87–5.11)
RDW: 28.4 % — ABNORMAL HIGH (ref 11.5–15.5)
WBC: 11.8 10*3/uL — ABNORMAL HIGH (ref 4.0–10.5)
nRBC: 0.7 % — ABNORMAL HIGH (ref 0.0–0.2)

## 2023-06-29 LAB — RPR: RPR Ser Ql: NONREACTIVE

## 2023-06-29 LAB — POCT I-STAT 7, (LYTES, BLD GAS, ICA,H+H)
Acid-base deficit: 4 mmol/L — ABNORMAL HIGH (ref 0.0–2.0)
Bicarbonate: 21.1 mmol/L (ref 20.0–28.0)
Calcium, Ion: 1.15 mmol/L (ref 1.15–1.40)
HCT: 28 % — ABNORMAL LOW (ref 36.0–46.0)
Hemoglobin: 9.5 g/dL — ABNORMAL LOW (ref 12.0–15.0)
O2 Saturation: 97 %
Potassium: 4.1 mmol/L (ref 3.5–5.1)
Sodium: 139 mmol/L (ref 135–145)
TCO2: 22 mmol/L (ref 22–32)
pCO2 arterial: 38.5 mm[Hg] (ref 32–48)
pH, Arterial: 7.346 — ABNORMAL LOW (ref 7.35–7.45)
pO2, Arterial: 94 mm[Hg] (ref 83–108)

## 2023-06-29 LAB — GLUCOSE, CAPILLARY
Glucose-Capillary: 93 mg/dL (ref 70–99)
Glucose-Capillary: 122 mg/dL — ABNORMAL HIGH (ref 70–99)
Glucose-Capillary: 156 mg/dL — ABNORMAL HIGH (ref 70–99)

## 2023-06-29 SURGERY — Surgical Case
Anesthesia: Spinal | Site: Abdomen

## 2023-06-29 MED ORDER — ZOLPIDEM TARTRATE 5 MG PO TABS: 5.0000 mg | ORAL_TABLET | Freq: Every evening | ORAL | Status: DC | PRN

## 2023-06-29 MED ORDER — SOD CITRATE-CITRIC ACID 500-334 MG/5ML PO SOLN
ORAL | Status: AC
  Filled 2023-06-29: qty 30

## 2023-06-29 MED ORDER — ACETAMINOPHEN 325 MG PO TABS
650.0000 mg | ORAL_TABLET | Freq: Four times a day (QID) | ORAL | Status: DC | PRN
  Administered 2023-06-30: 650 mg via ORAL
  Filled 2023-06-29: qty 2

## 2023-06-29 MED ORDER — AMISULPRIDE (ANTIEMETIC) 5 MG/2ML IV SOLN: 10.0000 mg | Freq: Once | INTRAVENOUS | Status: DC | PRN

## 2023-06-29 MED ORDER — MORPHINE SULFATE (PF) 0.5 MG/ML IJ SOLN
INTRAMUSCULAR | Status: DC | PRN
  Administered 2023-06-29: 150 ug via INTRATHECAL

## 2023-06-29 MED ORDER — DIPHENHYDRAMINE HCL 25 MG PO CAPS: 25.0000 mg | ORAL_CAPSULE | Freq: Four times a day (QID) | ORAL | Status: DC | PRN

## 2023-06-29 MED ORDER — FENTANYL CITRATE (PF) 100 MCG/2ML IJ SOLN
INTRAMUSCULAR | Status: AC
  Filled 2023-06-29: qty 2

## 2023-06-29 MED ORDER — ACETAMINOPHEN 500 MG PO TABS
1000.0000 mg | ORAL_TABLET | Freq: Four times a day (QID) | ORAL | Status: AC
  Administered 2023-06-29 – 2023-06-30 (×2): 1000 mg via ORAL
  Filled 2023-06-29 (×2): qty 2

## 2023-06-29 MED ORDER — BUDESONIDE 0.25 MG/2ML IN SUSP
0.2500 mg | Freq: Two times a day (BID) | RESPIRATORY_TRACT | Status: DC
  Administered 2023-06-29 – 2023-07-10 (×15): 0.25 mg via RESPIRATORY_TRACT
  Filled 2023-06-29 (×26): qty 2

## 2023-06-29 MED ORDER — ONDANSETRON HCL 4 MG/2ML IJ SOLN
INTRAMUSCULAR | Status: AC
  Filled 2023-06-29: qty 2

## 2023-06-29 MED ORDER — STERILE WATER FOR IRRIGATION IR SOLN
Status: DC | PRN
  Administered 2023-06-29: 1000 mL

## 2023-06-29 MED ORDER — CEFAZOLIN SODIUM-DEXTROSE 2-4 GM/100ML-% IV SOLN
INTRAVENOUS | Status: AC
  Filled 2023-06-29: qty 100

## 2023-06-29 MED ORDER — NIFEDIPINE ER OSMOTIC RELEASE 30 MG PO TB24: 30.0000 mg | ORAL_TABLET | Freq: Every day | ORAL | Status: DC

## 2023-06-29 MED ORDER — DIPHENHYDRAMINE HCL 50 MG/ML IJ SOLN: 12.5000 mg | INTRAMUSCULAR | Status: DC | PRN

## 2023-06-29 MED ORDER — PRENATAL MULTIVITAMIN CH
1.0000 | ORAL_TABLET | Freq: Every day | ORAL | Status: DC
  Administered 2023-06-30 – 2023-07-02 (×3): 1 via ORAL
  Filled 2023-06-29 (×3): qty 1

## 2023-06-29 MED ORDER — ACETAMINOPHEN 10 MG/ML IV SOLN
INTRAVENOUS | Status: DC | PRN
Start: 2023-06-29 — End: 2023-06-29
  Administered 2023-06-29: 1000 mg via INTRAVENOUS

## 2023-06-29 MED ORDER — SODIUM CHLORIDE 0.9 % IR SOLN
Status: DC | PRN
  Administered 2023-06-29: 1000 mL

## 2023-06-29 MED ORDER — PHENYLEPHRINE 80 MCG/ML (10ML) SYRINGE FOR IV PUSH (FOR BLOOD PRESSURE SUPPORT)
PREFILLED_SYRINGE | INTRAVENOUS | Status: DC | PRN
  Administered 2023-06-29 (×3): 80 ug via INTRAVENOUS

## 2023-06-29 MED ORDER — FENTANYL CITRATE (PF) 100 MCG/2ML IJ SOLN
INTRAMUSCULAR | Status: DC | PRN
  Administered 2023-06-29: 15 ug via INTRATHECAL

## 2023-06-29 MED ORDER — ALBUMIN HUMAN 5 % IV SOLN
INTRAVENOUS | Status: AC
  Filled 2023-06-29: qty 250

## 2023-06-29 MED ORDER — SODIUM CHLORIDE 0.9% FLUSH: 3.0000 mL | INTRAVENOUS | Status: DC | PRN

## 2023-06-29 MED ORDER — OXYTOCIN-SODIUM CHLORIDE 30-0.9 UT/500ML-% IV SOLN
INTRAVENOUS | Status: AC
  Filled 2023-06-29: qty 500

## 2023-06-29 MED ORDER — LACTATED RINGERS IV SOLN: INTRAVENOUS | Status: DC

## 2023-06-29 MED ORDER — HYDROMORPHONE HCL 1 MG/ML IJ SOLN: 0.2500 mg | INTRAMUSCULAR | Status: DC | PRN

## 2023-06-29 MED ORDER — ALBUMIN HUMAN 5 % IV SOLN
INTRAVENOUS | Status: DC | PRN
Start: 2023-06-29 — End: 2023-06-29

## 2023-06-29 MED ORDER — OXYCODONE HCL 5 MG PO TABS: 5.0000 mg | ORAL_TABLET | Freq: Once | ORAL | Status: DC | PRN

## 2023-06-29 MED ORDER — SENNOSIDES-DOCUSATE SODIUM 8.6-50 MG PO TABS
2.0000 | ORAL_TABLET | ORAL | Status: DC
  Administered 2023-06-30 – 2023-07-02 (×3): 2 via ORAL
  Filled 2023-06-29 (×3): qty 2

## 2023-06-29 MED ORDER — ENOXAPARIN SODIUM 60 MG/0.6ML IJ SOSY
60.0000 mg | PREFILLED_SYRINGE | INTRAMUSCULAR | Status: DC
  Administered 2023-06-30 – 2023-07-04 (×5): 60 mg via SUBCUTANEOUS
  Filled 2023-06-29 (×5): qty 0.6

## 2023-06-29 MED ORDER — TETANUS-DIPHTH-ACELL PERTUSSIS 5-2.5-18.5 LF-MCG/0.5 IM SUSY: 0.5000 mL | PREFILLED_SYRINGE | Freq: Once | INTRAMUSCULAR | Status: DC

## 2023-06-29 MED ORDER — DEXAMETHASONE SODIUM PHOSPHATE 4 MG/ML IJ SOLN
INTRAMUSCULAR | Status: AC
  Filled 2023-06-29: qty 1

## 2023-06-29 MED ORDER — PHENYLEPHRINE 80 MCG/ML (10ML) SYRINGE FOR IV PUSH (FOR BLOOD PRESSURE SUPPORT)
PREFILLED_SYRINGE | INTRAVENOUS | Status: AC
  Filled 2023-06-29: qty 10

## 2023-06-29 MED ORDER — MEPERIDINE HCL 25 MG/ML IJ SOLN: 6.2500 mg | INTRAMUSCULAR | Status: DC | PRN

## 2023-06-29 MED ORDER — PANTOPRAZOLE SODIUM 40 MG PO TBEC
40.0000 mg | DELAYED_RELEASE_TABLET | Freq: Every day | ORAL | Status: DC
  Administered 2023-06-30 – 2023-07-03 (×4): 40 mg via ORAL
  Filled 2023-06-29 (×5): qty 1

## 2023-06-29 MED ORDER — SCOPOLAMINE 1 MG/3DAYS TD PT72
1.0000 | MEDICATED_PATCH | Freq: Once | TRANSDERMAL | Status: AC
  Administered 2023-06-29: 1.5 mg via TRANSDERMAL

## 2023-06-29 MED ORDER — ONDANSETRON HCL 4 MG/2ML IJ SOLN: 4.0000 mg | Freq: Once | INTRAMUSCULAR | Status: DC | PRN

## 2023-06-29 MED ORDER — ACETAMINOPHEN 10 MG/ML IV SOLN
INTRAVENOUS | Status: AC
  Filled 2023-06-29: qty 100

## 2023-06-29 MED ORDER — NALOXONE HCL 4 MG/10ML IJ SOLN: 1.0000 ug/kg/h | INTRAVENOUS | Status: DC | PRN

## 2023-06-29 MED ORDER — INSULIN GLARGINE-YFGN 100 UNIT/ML ~~LOC~~ SOLN
5.0000 [IU] | Freq: Every day | SUBCUTANEOUS | Status: DC
  Administered 2023-06-29 – 2023-07-03 (×5): 5 [IU] via SUBCUTANEOUS
  Filled 2023-06-29 (×6): qty 0.05

## 2023-06-29 MED ORDER — SCOPOLAMINE 1 MG/3DAYS TD PT72
MEDICATED_PATCH | TRANSDERMAL | Status: AC
  Filled 2023-06-29: qty 1

## 2023-06-29 MED ORDER — WITCH HAZEL-GLYCERIN EX PADS: 1.0000 | MEDICATED_PAD | CUTANEOUS | Status: DC | PRN

## 2023-06-29 MED ORDER — OXYCODONE HCL 5 MG PO TABS
5.0000 mg | ORAL_TABLET | ORAL | Status: DC | PRN
  Administered 2023-06-29: 5 mg via ORAL
  Administered 2023-06-30 – 2023-07-03 (×14): 10 mg via ORAL
  Filled 2023-06-29 (×5): qty 2
  Filled 2023-06-29: qty 1
  Filled 2023-06-29 (×9): qty 2

## 2023-06-29 MED ORDER — PHENYLEPHRINE HCL-NACL 20-0.9 MG/250ML-% IV SOLN
INTRAVENOUS | Status: DC | PRN
  Administered 2023-06-29: 40 ug/min via INTRAVENOUS

## 2023-06-29 MED ORDER — TRANEXAMIC ACID-NACL 1000-0.7 MG/100ML-% IV SOLN
INTRAVENOUS | Status: DC | PRN
Start: 2023-06-29 — End: 2023-06-29
  Administered 2023-06-29: 1000 mg via INTRAVENOUS

## 2023-06-29 MED ORDER — MORPHINE SULFATE (PF) 0.5 MG/ML IJ SOLN
INTRAMUSCULAR | Status: AC
  Filled 2023-06-29: qty 10

## 2023-06-29 MED ORDER — SIMETHICONE 80 MG PO CHEW
80.0000 mg | CHEWABLE_TABLET | ORAL | Status: DC | PRN
  Administered 2023-06-29 – 2023-06-30 (×2): 80 mg via ORAL
  Filled 2023-06-29: qty 1

## 2023-06-29 MED ORDER — BUPIVACAINE IN DEXTROSE 0.75-8.25 % IT SOLN
INTRATHECAL | Status: DC | PRN
  Administered 2023-06-29: 1.5 mL via INTRATHECAL

## 2023-06-29 MED ORDER — OXYTOCIN-SODIUM CHLORIDE 30-0.9 UT/500ML-% IV SOLN
INTRAVENOUS | Status: DC | PRN
  Administered 2023-06-29: 30 [IU] via INTRAVENOUS

## 2023-06-29 MED ORDER — DIPHENHYDRAMINE HCL 25 MG PO CAPS: 25.0000 mg | ORAL_CAPSULE | ORAL | Status: DC | PRN

## 2023-06-29 MED ORDER — ONDANSETRON HCL 4 MG/2ML IJ SOLN
4.0000 mg | Freq: Three times a day (TID) | INTRAMUSCULAR | Status: DC | PRN
  Administered 2023-06-30 – 2023-07-06 (×4): 4 mg via INTRAVENOUS
  Filled 2023-06-29 (×4): qty 2

## 2023-06-29 MED ORDER — MENTHOL 3 MG MT LOZG: 1.0000 | LOZENGE | OROMUCOSAL | Status: DC | PRN

## 2023-06-29 MED ORDER — KETOROLAC TROMETHAMINE 30 MG/ML IJ SOLN
INTRAMUSCULAR | Status: AC
  Filled 2023-06-29: qty 1

## 2023-06-29 MED ORDER — ONDANSETRON HCL 4 MG/2ML IJ SOLN
INTRAMUSCULAR | Status: DC | PRN
  Administered 2023-06-29: 4 mg via INTRAVENOUS

## 2023-06-29 MED ORDER — ALBUTEROL SULFATE (2.5 MG/3ML) 0.083% IN NEBU
2.5000 mg | INHALATION_SOLUTION | Freq: Four times a day (QID) | RESPIRATORY_TRACT | Status: DC | PRN
  Administered 2023-07-01 – 2023-07-07 (×4): 2.5 mg via RESPIRATORY_TRACT
  Filled 2023-06-29 (×4): qty 3

## 2023-06-29 MED ORDER — SIMETHICONE 80 MG PO CHEW
80.0000 mg | CHEWABLE_TABLET | Freq: Three times a day (TID) | ORAL | Status: DC
  Administered 2023-06-30 – 2023-07-03 (×11): 80 mg via ORAL
  Filled 2023-06-29 (×11): qty 1

## 2023-06-29 MED ORDER — SODIUM CHLORIDE 0.9 % IV SOLN
500.0000 mg | INTRAVENOUS | Status: DC
  Filled 2023-06-29: qty 5

## 2023-06-29 MED ORDER — DIBUCAINE (PERIANAL) 1 % EX OINT: 1.0000 | TOPICAL_OINTMENT | CUTANEOUS | Status: DC | PRN

## 2023-06-29 MED ORDER — OXYCODONE HCL 5 MG/5ML PO SOLN: 5.0000 mg | Freq: Once | ORAL | Status: DC | PRN

## 2023-06-29 MED ORDER — OXYTOCIN-SODIUM CHLORIDE 30-0.9 UT/500ML-% IV SOLN: 2.5000 [IU]/h | INTRAVENOUS | Status: AC

## 2023-06-29 MED ORDER — COCONUT OIL OIL: 1.0000 | TOPICAL_OIL | Status: DC | PRN

## 2023-06-29 MED ORDER — KETOROLAC TROMETHAMINE 30 MG/ML IJ SOLN: 30.0000 mg | Freq: Once | INTRAMUSCULAR | Status: DC | PRN

## 2023-06-29 MED ORDER — TRANEXAMIC ACID-NACL 1000-0.7 MG/100ML-% IV SOLN
INTRAVENOUS | Status: AC
  Filled 2023-06-29: qty 100

## 2023-06-29 MED ORDER — DEXAMETHASONE SODIUM PHOSPHATE 10 MG/ML IJ SOLN
INTRAMUSCULAR | Status: DC | PRN
  Administered 2023-06-29: 4 mg via INTRAVENOUS

## 2023-06-29 MED ORDER — NALOXONE HCL 0.4 MG/ML IJ SOLN: 0.4000 mg | INTRAMUSCULAR | Status: DC | PRN

## 2023-06-29 MED ORDER — KETOROLAC TROMETHAMINE 30 MG/ML IJ SOLN
INTRAMUSCULAR | Status: DC | PRN
Start: 2023-06-29 — End: 2023-06-29
  Administered 2023-06-29: 30 mg via INTRAVENOUS

## 2023-06-29 MED ORDER — SOD CITRATE-CITRIC ACID 500-334 MG/5ML PO SOLN
30.0000 mL | ORAL | Status: AC
  Administered 2023-06-29: 30 mL via ORAL

## 2023-06-29 MED ORDER — CEFAZOLIN SODIUM-DEXTROSE 2-4 GM/100ML-% IV SOLN
2.0000 g | INTRAVENOUS | Status: AC
  Administered 2023-06-29: 2 g via INTRAVENOUS

## 2023-06-29 MED ORDER — KETOROLAC TROMETHAMINE 30 MG/ML IJ SOLN: 30.0000 mg | Freq: Four times a day (QID) | INTRAMUSCULAR | Status: AC | PRN

## 2023-06-29 MED ORDER — KETOROLAC TROMETHAMINE 30 MG/ML IJ SOLN
30.0000 mg | Freq: Four times a day (QID) | INTRAMUSCULAR | Status: AC | PRN
  Administered 2023-06-29: 30 mg via INTRAVENOUS
  Filled 2023-06-29 (×2): qty 1

## 2023-06-29 SURGICAL SUPPLY — 41 items
APL PRP STRL LF DISP 70% ISPRP (MISCELLANEOUS) ×2
APL SKNCLS STERI-STRIP NONHPOA (GAUZE/BANDAGES/DRESSINGS) ×1
BENZOIN TINCTURE PRP APPL 2/3 (GAUZE/BANDAGES/DRESSINGS) ×2 IMPLANT
CHLORAPREP W/TINT 26 (MISCELLANEOUS) ×4 IMPLANT
CLAMP UMBILICAL CORD (MISCELLANEOUS) ×2 IMPLANT
CLOTH BEACON ORANGE TIMEOUT ST (SAFETY) ×2 IMPLANT
DISSECTOR SURG LIGASURE 21 (MISCELLANEOUS) IMPLANT
DRAIN JACKSON PRT FLT 10 (DRAIN) IMPLANT
DRSG OPSITE POSTOP 4X10 (GAUZE/BANDAGES/DRESSINGS) ×2 IMPLANT
ELECT REM PT RETURN 9FT ADLT (ELECTROSURGICAL) ×1
ELECTRODE REM PT RTRN 9FT ADLT (ELECTROSURGICAL) ×2 IMPLANT
EVACUATOR SILICONE 100CC (DRAIN) IMPLANT
EXTRACTOR VACUUM M CUP 4 TUBE (SUCTIONS) IMPLANT
GAUZE SPONGE 4X4 12PLY STRL (GAUZE/BANDAGES/DRESSINGS) IMPLANT
GLOVE BIO SURGEON STRL SZ 6.5 (GLOVE) ×2 IMPLANT
GLOVE BIOGEL PI IND STRL 7.0 (GLOVE) ×4 IMPLANT
GOWN STRL REUS W/TWL LRG LVL3 (GOWN DISPOSABLE) ×4 IMPLANT
HEMOSTAT ARISTA ABSORB 3G PWDR (HEMOSTASIS) IMPLANT
KIT ABG SYR 3ML LUER SLIP (SYRINGE) IMPLANT
MAT PREVALON FULL STRYKER (MISCELLANEOUS) IMPLANT
NDL HYPO 25X5/8 SAFETYGLIDE (NEEDLE) IMPLANT
NEEDLE HYPO 25X5/8 SAFETYGLIDE (NEEDLE) IMPLANT
NS IRRIG 1000ML POUR BTL (IV SOLUTION) ×2 IMPLANT
PACK C SECTION WH (CUSTOM PROCEDURE TRAY) ×2 IMPLANT
PAD ABD DERMACEA PRESS 5X9 (GAUZE/BANDAGES/DRESSINGS) IMPLANT
PAD OB MATERNITY 4.3X12.25 (PERSONAL CARE ITEMS) ×2 IMPLANT
RTRCTR C-SECT PINK 25CM LRG (MISCELLANEOUS) IMPLANT
STRIP CLOSURE SKIN 1/2X4 (GAUZE/BANDAGES/DRESSINGS) ×2 IMPLANT
SUT CHROMIC 0 CT 1 (SUTURE) ×2 IMPLANT
SUT MNCRL AB 3-0 PS2 27 (SUTURE) ×2 IMPLANT
SUT PLAIN 2 0 (SUTURE) ×2
SUT PLAIN 2 0 XLH (SUTURE) ×2 IMPLANT
SUT PLAIN ABS 2-0 CT1 27XMFL (SUTURE) ×4 IMPLANT
SUT SILK 2 0 SH (SUTURE) IMPLANT
SUT VIC AB 0 CTX 36 (SUTURE) ×4
SUT VIC AB 0 CTX36XBRD ANBCTRL (SUTURE) ×8 IMPLANT
SUT VIC AB 2-0 SH 27 (SUTURE)
SUT VIC AB 2-0 SH 27XBRD (SUTURE) IMPLANT
TOWEL OR 17X24 6PK STRL BLUE (TOWEL DISPOSABLE) ×2 IMPLANT
TRAY FOLEY W/BAG SLVR 14FR LF (SET/KITS/TRAYS/PACK) ×2 IMPLANT
WATER STERILE IRR 1000ML POUR (IV SOLUTION) ×2 IMPLANT

## 2023-06-29 NOTE — H&P (Signed)
DALE STRAUSSER is a 37 y.o. female presenting for cesarean section at 36 weeks.  They are both breech.  She would also like bilateral salpingectomy .  She has CHTN and DM and delivery is recommended by MFM at 36 weeks.  Good fM. OB History     Gravida  5   Para  2   Term  2   Preterm      AB  2   Living  2      SAB      IAB  2   Ectopic      Multiple  0   Live Births  2          Past Medical History:  Diagnosis Date   Anemia 03/30/2019   Asthma    Asthma 08/25/2014   Diabetes mellitus without complication (HCC)    Hx of varicella    Hypertension    Low grade squamous intraepithelial lesion (LGSIL) on cervicovaginal cytologic smear 03/30/2019   Pregnancy 12/19/2022   Sickle cell trait (HCC)    Subchorionic hematoma 12/19/2022   Moderate size, 4.3 x 3.1 x 2, noted at 7 1/7 week [place].   Uterine leiomyoma 08/25/2014   Vitamin D deficiency 01/05/2023   Noted at NOB, will rx supplement. Noted at NOB, rx'd supplement.     Past Surgical History:  Procedure Laterality Date   NO PAST SURGERIES     Family History: family history includes Cancer in her maternal grandmother; Diabetes in her maternal grandmother and mother; Hypertension in her maternal grandmother and mother; Sickle cell trait in her maternal aunt, maternal grandmother, mother, sister, and son. Social History:  reports that she has never smoked. She has never used smokeless tobacco. She reports that she does not drink alcohol and does not use drugs.     Maternal Diabetes: Yes:  Diabetes Type:  Pre-pregnancy Genetic Screening: Abnormal:  Results: Other: Maternal Ultrasounds/Referrals: Other: Fetal Ultrasounds or other Referrals:  Fetal echo, Referred to Materal Fetal Medicine  Maternal Substance Abuse:  No Significant Maternal Medications:  Meds include: Protonix Other:  Significant Maternal Lab Results:  Group B Strep positive Number of Prenatal Visits:greater than 3 verified prenatal  visits Maternal Vaccinations: NONE  Other Comments:   PANORAMA INSUFF SAMPLE TIMES TWO FETAL US  BABY A CPAM WHICH RESOLVED BAY B ABSENT NB AND FETAL PYELECTASIS Review of Systems History   Blood pressure (!) 155/88, pulse 88, temperature 98.9 F (37.2 C), temperature source Oral, resp. rate 17, height 5\' 4"  (1.626 m), weight 115.3 kg, last menstrual period 10/20/2022, SpO2 97%. Exam Physical Exam  Physical Examination: General appearance - alert, well appearing, and in no distress Chest - clear to auscultation, no wheezes, rales or rhonchi, symmetric air entry Heart - normal rate and regular rhythm Abdomen - soft, nontender, nondistended, no masses or organomegaly GRAVID Extremities - Homan's sign negative bilaterally  Prenatal labs: ABO, Rh: --/--/A POS (09/27 1610) Antibody: NEG (09/27 0939) Rubella: Immune (04/03 0000) RPR: NON REACTIVE (09/27 0933)  HBsAg: Negative (04/03 0000)  HIV: Non-reactive (04/03 0000)  GBS:   CARRIER  Assessment/Plan: 36 WEEKS TWINS FOR CS BOTH ARE BREECH  GDM ON LANTUS AND NOVOLOG.  BS ARE CONTROLLED CHTN PT ON PROCRDIA PT DESIRES BTL OR B SALPINGECTOMY FETAL ANOMALIES SEEN ON [place] WILL HAVE PEDS EVAL AFTER DELIVERY PT UNDERSTANDS THE RISKS ARE BUT NOTLIMITED TO BLEEDING, INFECTION AND DAMAGE TO INTERNAL ORGANS    Birtie Fellman A Danish Ruffins 06/29/2023, 10:26 AM

## 2023-06-29 NOTE — Transfer of Care (Signed)
Immediate Anesthesia Transfer of Care Note  Patient: Erika Lewis  Procedure(s) Performed: CESAREAN SECTION (Abdomen)  Patient Location: PACU  Anesthesia Type:Spinal  Level of Consciousness: awake, alert , and oriented  Airway & Oxygen Therapy: Patient Spontanous Breathing  Post-op Assessment: Report given to RN and Post -op Vital signs reviewed and stable  Post vital signs: Reviewed and stable  Last Vitals:  Vitals Value Taken Time  BP    Temp    Pulse 74 06/29/23 1253  Resp 16 06/29/23 1253  SpO2 92 % 06/29/23 1253  Vitals shown include unfiled device data.  Last Pain:  Vitals:   06/29/23 0940  TempSrc: Oral  PainSc: 0-No pain         Complications: No notable events documented.

## 2023-06-29 NOTE — Anesthesia Procedure Notes (Signed)
Spinal  Patient location during procedure: OR Start time: 06/29/2023 11:06 AM End time: 06/29/2023 11:09 AM Reason for block: surgical anesthesia Staffing Performed: anesthesiologist  Anesthesiologist: Lannie Fields, DO Performed by: Lannie Fields, DO Authorized by: Lannie Fields, DO   Preanesthetic Checklist Completed: patient identified, IV checked, risks and benefits discussed, surgical consent, monitors and equipment checked, pre-op evaluation and timeout performed Spinal Block Patient position: sitting Prep: DuraPrep and site prepped and draped Patient monitoring: cardiac monitor, continuous pulse ox and blood pressure Approach: midline Location: L3-4 Injection technique: single-shot Needle Needle type: Pencan  Needle gauge: 24 G Needle length: 9 cm Assessment Sensory level: T6 Events: CSF return Additional Notes Functioning IV was confirmed and monitors were applied. Sterile prep and drape, including hand hygiene and sterile gloves were used. The patient was positioned and the spine was prepped. The skin was anesthetized with lidocaine.  Free flow of clear CSF was obtained prior to injecting local anesthetic into the CSF.  The spinal needle aspirated freely following injection.  The needle was carefully withdrawn.  The patient tolerated the procedure well.

## 2023-06-29 NOTE — Anesthesia Postprocedure Evaluation (Signed)
Anesthesia Post Note  Patient: LYSHA SCHRADE  Procedure(s) Performed: CESAREAN SECTION (Abdomen)     Patient location during evaluation: PACU Anesthesia Type: Spinal Level of consciousness: awake and alert and oriented Pain management: pain level controlled Vital Signs Assessment: post-procedure vital signs reviewed and stable Respiratory status: spontaneous breathing, nonlabored ventilation and respiratory function stable Cardiovascular status: blood pressure returned to baseline and stable Postop Assessment: no headache, no backache, spinal receding and no apparent nausea or vomiting Anesthetic complications: no Comments: PACU CBC Hb 9.1   No notable events documented.  Last Vitals:  Vitals:   06/29/23 1315 06/29/23 1330  BP: (!) 123/93 117/88  Pulse: 82 83  Resp: 15 20  Temp:  36.7 C  SpO2: 92% 93%    Last Pain:  Vitals:   06/29/23 1330  TempSrc: Oral  PainSc: 2    Pain Goal:    LLE Motor Response: Purposeful movement (06/29/23 1330) LLE Sensation: Tingling (06/29/23 1330) RLE Motor Response: Purposeful movement (06/29/23 1330) RLE Sensation: Tingling (06/29/23 1330)     Epidural/Spinal Function Cutaneous sensation: Able to Wiggle Toes (06/29/23 1330), Patient able to flex knees: Yes (06/29/23 1330), Patient able to lift hips off bed: No (06/29/23 1330), Back pain beyond tenderness at insertion site: No (06/29/23 1330), Progressively worsening motor and/or sensory loss: No (06/29/23 1330), Bowel and/or bladder incontinence post epidural: No (06/29/23 1330)  Lannie Fields

## 2023-06-29 NOTE — Op Note (Signed)
Cesarean Section Procedure Note   DAVEY BERGSMA  06/29/2023  Indications: Breech Presentation and 36 weeks twins   CHTN TYPE 2 DM  Pre-operative Diagnosis: malpresentation; twins.   Post-operative Diagnosis: Same   Surgeon Eldine Rencher  Assistants: Dr Earlene Plater MD. Needed for the complexity of the case   Anesthesia: spinal   Procedure Details:  The patient was seen in the Holding Room. The risks, benefits, complications, treatment options, and expected outcomes were discussed with the patient. The patient concurred with the proposed plan, giving informed consent. identified as Donaciano Eva and the procedure verified as C-Section Delivery. A Time Out was held and the above information confirmed.  After induction of anesthesia, the patient was draped and prepped in the usual sterile manner. A transverse incision was made and carried down through the subcutaneous tissue to the fascia. Fascial incision was made and extended transversely. The fascia was separated from the underlying rectus tissue superiorly and inferiorly. The peritoneum was identified and entered. Peritoneal incision was extended longitudinally. The utero-vesical peritoneal reflection was incised transversely and the bladder flap was bluntly freed from the lower uterine segment. A low transverse uterine incision was made. Delivered from breech footling presentation was a 5-8#Female with Apgar scores of 2 at one minute and 8 at five minutes. Cord ph was not sent the umbilical cord was clamped and cut cord blood was obtained for evaluation.  The female infant was also footling breech and delivered in the same manner with breech manuevers.  6-15# apgars 1,8The placenta was removed Intact and appeared normal. The uterine outline, tubes and ovaries appeared normal}. The uterine incision was closed with running locked sutures of 0Vicryl. A second layer of 0 vicryl was used to imbricate the uterus.a third layer was used for hemostasis.     The patients left fallopian tube was graspedand excised using the ligasure.    The patients right fallopian tube was followed out to the fimbriated end.  The  tube was ligated and excised.  Both portions of tubes was sent to pathology.     Hemostasis was observed. Lavage was carried out until clear.  erisTA WAS placed on oozing areas.  The fascia was then reapproximated with running sutures of 0Vicryl. The subcuticular closure was performed using 2-0plain gut. The skin was closed with 3-37monocryl.   Instrument, sponge, and needle counts were correct prior the abdominal closure and were correct at the conclusion of the case.    Findings:   Estimated Blood Loss: 1773 cc   Total IV Fluids:   Urine Output:  300CC OF clear urine  Specimens: @ORSPECIMEN @   Complication PPH/  large sinuses were bleeding rapidly. These were ligated with suture. The pt was also given lysteda  Disposition: PACU - hemodynamically stable.   Maternal Condition: stable   Baby condition / location:  Couplet care / Skin to Skin  Attending Attestation: I was present and scrubbed for the entire procedure.   Signed: Surgeon(s): Jaymes Graff, MD

## 2023-06-30 LAB — CBC
HCT: 25.4 % — ABNORMAL LOW (ref 36.0–46.0)
Hemoglobin: 7.9 g/dL — ABNORMAL LOW (ref 12.0–15.0)
MCH: 24.3 pg — ABNORMAL LOW (ref 26.0–34.0)
MCHC: 31.1 g/dL (ref 30.0–36.0)
MCV: 78.2 fL — ABNORMAL LOW (ref 80.0–100.0)
Platelets: 127 10*3/uL — ABNORMAL LOW (ref 150–400)
RBC: 3.25 MIL/uL — ABNORMAL LOW (ref 3.87–5.11)
RDW: 28.3 % — ABNORMAL HIGH (ref 11.5–15.5)
WBC: 11.2 10*3/uL — ABNORMAL HIGH (ref 4.0–10.5)
nRBC: 0.7 % — ABNORMAL HIGH (ref 0.0–0.2)

## 2023-06-30 LAB — GLUCOSE, CAPILLARY
Glucose-Capillary: 112 mg/dL — ABNORMAL HIGH (ref 70–99)
Glucose-Capillary: 144 mg/dL — ABNORMAL HIGH (ref 70–99)
Glucose-Capillary: 105 mg/dL — ABNORMAL HIGH (ref 70–99)

## 2023-06-30 LAB — CREATININE, SERUM
Creatinine, Ser: 0.89 mg/dL (ref 0.44–1.00)
GFR, Estimated: >60 mL/min (ref >60)

## 2023-06-30 MED ORDER — IRON SUCROSE 500 MG IVPB - SIMPLE MED
500.0000 mg | Freq: Once | INTRAVENOUS | Status: AC
  Administered 2023-06-30: 500 mg via INTRAVENOUS
  Filled 2023-06-30: qty 275

## 2023-06-30 MED ORDER — INSULIN ASPART 100 UNIT/ML IJ SOLN
5.0000 [IU] | Freq: Three times a day (TID) | INTRAMUSCULAR | Status: DC
  Administered 2023-06-30 – 2023-07-03 (×9): 5 [IU] via SUBCUTANEOUS

## 2023-06-30 MED ORDER — NIFEDIPINE ER OSMOTIC RELEASE 30 MG PO TB24
30.0000 mg | ORAL_TABLET | Freq: Every day | ORAL | Status: DC
  Administered 2023-06-30 – 2023-07-04 (×5): 30 mg via ORAL
  Filled 2023-06-30 (×5): qty 1

## 2023-06-30 MED ORDER — INSULIN ASPART 100 UNIT/ML IJ SOLN: 5.0000 [IU] | Freq: Three times a day (TID) | INTRAMUSCULAR | Status: DC

## 2023-06-30 NOTE — Plan of Care (Signed)
Progressing post csection

## 2023-06-30 NOTE — Lactation Note (Signed)
This note was copied from a baby's chart.  NICU Lactation Consultation Note  Patient Name: Erika Lewis [id number]'A Date: 06/30/2023 Age:37 hours  Reason for consult: Initial assessment; NICU baby; Multiple gestation; Other (Comment); Maternal endocrine disorder; Infant < 6lbs; Late-preterm 34-36.6wks (cHTN, IUGR, AMA) Type of Endocrine Disorder?: Diabetes (DM2 (insulin))  SUBJECTIVE Visited with family of 49 hours old LPI NICU twin female; Erika Lewis is a P3 and reports she hasn't started pumping yet. This LC set up a DEBP and she initiated pumping during [facility] consult, praised her for her efforts. Her plan is to do both, breast and formula but agreed to start donor milk, consent form was signed during [facility] consult as well. Reviewed pumping schedule, pump settings, lactogenesis II and anticipatory guidelines.  OBJECTIVE Infant data: Mother's Current Feeding Choice: Breast Milk and Donor Milk  O2 Device: Room Air  Infant feeding assessment Scale for Readiness: 2 Scale for Quality: 2   Maternal data: [id number] C-Section, Low Transverse Has patient been taught Hand Expression?: Yes Hand Expression Comments: droplets noted Significant Breast History:: (+) breast changes during the pregnancy Current breast feeding challenges:: NICU admission Does the patient have breastfeeding experience prior to this delivery?: Yes How long did the patient breastfeed?: 2nd child for 1 month Pumping frequency: Set up DEBP at 24 hours post-partum Flange Size: 21 Risk factor for low/delayed milk supply:: C/S, prematurity, DM2, AMA  Pump: Personal (Lansinoh DEBP)  ASSESSMENT Infant: Feeding Status: Scheduled 8-11-2-5 Feeding method: Bottle Nipple Type: Nfant Slow Flow (purple)  Maternal: Breast are soft and tissue is compressible  INTERVENTIONS/PLAN Interventions: Interventions: Breast feeding basics reviewed; DEBP; Education; LC Services brochure; Coconut oil Tools: Pump; Flanges; Coconut  oil Pump Education: Setup, frequency, and cleaning; Milk Storage  Plan: Encouraged pumping every 3 hours, ideally 8 pumping sessions/24 hours Breast massage, hand expression and coconut oil were also encouraged prior pumping She'll call for assistance when baby is ready to go to breast  Female visitors present and supportive. All questions and concerns answered, family to contact [facility] services PRN.  Consult Status: NICU follow-up NICU Follow-up type: New admission follow up   Erika Lewis 06/30/2023, 1:04 PM

## 2023-06-30 NOTE — Plan of Care (Signed)
VSS

## 2023-06-30 NOTE — Lactation Note (Signed)
This note was copied from a baby's chart. Lactation Consultation Note  Patient Name: Yaneli Keithley WUXLK'G Date: 06/30/2023 Age:37 hours  LC entered the room Birth Parent and infant asleep, LC services will follow up with family in the morning.    Maternal Data    Feeding Nipple Type: Slow - flow  LATCH Score                    Lactation Tools Discussed/Used    Interventions    Discharge    Consult Status      Frederico Hamman 06/30/2023, 1:54 AM

## 2023-06-30 NOTE — Progress Notes (Signed)
Erika Lewis [id number] Postpartum Postoperative Day # 1  Erika Lewis, [id number], [redacted]w[redacted]d, S/P Primary LT Cesarean Section with salpingectomy  due to Twin Breech. Pt was admitted on 9/28 for scheduled PCS for twin breech delivery at 36 weeks, pt was taken to OR under spinal with Dr Normand Sloop on 9/28. Pt Had QBL with PPH of 1773, hgb drop of 9.6-7.9, declined blood, asymptomatic, will get IV Iron today, Baby Female in room and desires in pt circ prior to discharge. DM2 was on novolog 10units with meals during pregnancy and long acting was d/c during end of pregnancy r/t hypoglycemia, PP semglee at 5units for bedtime was restarted but no novolog, FBS was 130 with elevated PP therefore novolog 5units with meals was started back this morning. Pt on lovenox PP for VTE prophylactics for obesity. H/O CHTN was on procardia during pregnancy 30mg  xl but pt BP low over night 114/58, 111/58 therefore procardia discontinued.   Patient Active Problem List   Diagnosis Date Noted   Chronic hypertension affecting pregnancy 06/29/2023   S/P cesarean section 06/29/2023   Acute postoperative anemia due to greater than expected blood loss 06/29/2023   Postpartum hemorrhage 06/29/2023   Abnormal fetal ultrasound 05/29/2023   Uterine fibroid complicating antenatal care, baby not yet delivered 05/29/2023   Known fetal anomaly, fetus 1: CPAM, pelvic cyst 04/18/2023   Known fetal anomaly, antepartum, fetus 2 (Absent nasal bone, UTDA1), antepartum 04/18/2023   AMA (advanced maternal age) multigravida 35+ 03/15/2023   Chronic hypertension during pregnancy, antepartum 03/15/2023   Type 2 diabetes mellitus (HCC)-Insulin 02/19/2023   Carrier of genetic disorder 01/17/2023   Twin pregnancy 12/19/2022   Obesity with body mass index 30 or greater 03/30/2019   Sickle cell trait (HCC) 08/25/2014     Active Ambulatory Problems    Diagnosis Date Noted   Sickle cell trait (HCC) 08/25/2014   Obesity with body mass index 30  or greater 03/30/2019   Twin pregnancy 12/19/2022   AMA (advanced maternal age) multigravida 35+ 03/15/2023   Chronic hypertension during pregnancy, antepartum 03/15/2023   Carrier of genetic disorder 01/17/2023   Type 2 diabetes mellitus (HCC)-Insulin 02/19/2023   Known fetal anomaly, fetus 1: CPAM, pelvic cyst 04/18/2023   Known fetal anomaly, antepartum, fetus 2 (Absent nasal bone, UTDA1), antepartum 04/18/2023   Abnormal fetal ultrasound 05/29/2023   Uterine fibroid complicating antenatal care, baby not yet delivered 05/29/2023   Resolved Ambulatory Problems    Diagnosis Date Noted   Post-dates pregnancy 08/24/2014   Asthma 08/25/2014   Uterine leiomyoma 08/25/2014   Vaginal delivery 08/25/2014   Low grade squamous intraepithelial lesion (LGSIL) on cervicovaginal cytologic smear 03/30/2019   Anemia 03/30/2019   Subchorionic hematoma 12/19/2022   Pregnancy 12/19/2022   Vitamin D deficiency 01/05/2023   Past Medical History:  Diagnosis Date   Asthma    Diabetes mellitus without complication (HCC)    Hx of varicella    Hypertension      Subjective: Patient up ad lib, denies syncope or dizziness. Reports consuming regular diet without issues and denies N/V. Patient reports 0 bowel movement + passing flatus.  Denies issues with urination and reports bleeding is "lighter."  Patient is breast and bottle feeding and reports going well.  Desires salpingectomy done during PCS for postpartum contraception.  Pain is being appropriately managed with use of po meds. Denies HA, RUQ pain , vision changes, SOB, heart palpitations, cp or sob.    Objective: Patient Vitals for the past  24 hrs:  BP Temp Temp src Pulse Resp SpO2 Height Weight  06/30/23 0518 (!) 114/58 98.4 F (36.9 C) Oral 69 18 -- -- --  06/30/23 0241 (!) 111/59 97.9 F (36.6 C) Oral 68 18 96 % -- --  06/29/23 2130 -- 97.7 F (36.5 C) Oral -- 18 97 % -- --  06/29/23 1948 -- -- -- -- -- 98 % -- --  06/29/23 1730 110/66  98.3 F (36.8 C) Oral 78 17 96 % -- --  06/29/23 1630 115/71 98.2 F (36.8 C) Oral 77 18 97 % -- --  06/29/23 1535 129/67 98.1 F (36.7 C) Oral 81 17 96 % -- --  06/29/23 1435 133/76 98.3 F (36.8 C) -- 77 20 -- -- --  06/29/23 1345 134/76 -- -- 78 14 94 % -- --  06/29/23 1330 117/88 98.1 F (36.7 C) Oral 83 20 93 % -- --  06/29/23 1315 (!) 123/93 -- -- 82 15 92 % -- --  06/29/23 1300 -- -- -- 83 (!) 22 92 % -- --  06/29/23 1255 130/77 98.1 F (36.7 C) Oral 79 18 94 % -- --  06/29/23 0953 -- -- -- -- -- -- 5\' 4"  (1.626 m) --  06/29/23 0940 (!) 155/88 98.9 F (37.2 C) Oral 88 17 97 % -- 115.3 kg     Physical Exam:  General: alert, cooperative, and appears stated age Mood/Affect: Happy Lungs: clear to auscultation, no wheezes, rales or rhonchi, symmetric air entry.  Heart: normal rate, regular rhythm, normal S1, S2, no murmurs, rubs, clicks or gallops. Breast: breasts appear normal, no suspicious masses, no skin or nipple changes or axillary nodes. Abdomen:  + bowel sounds, soft, non-tender Incision: healing well, no significant drainage, no dehiscence, Honeycomb dressing  Uterine Fundus: firm, involution -1 Lochia: appropriate Skin: Warm, Dry. DVT Evaluation: No evidence of DVT seen on physical exam. Negative Homan's sign. No cords or calf tenderness.  Labs: Recent Labs    06/28/23 0933 06/29/23 1201 06/29/23 1302 06/30/23 0505  HGB 10.9* 9.5* 9.1* 7.9*  HCT 35.9* 28.0* 28.6* 25.4*  WBC 6.6  --  11.8* 11.2*    CBG (last 3)  Recent Labs    06/29/23 0933 06/29/23 1356 06/29/23 1844  GLUCAP 93 122* 156*     I/O: I/O last 3 completed shifts: In: 2000 [I.V.:1300; IV Piggyback:700] Out: 4998 [Urine:3225; Blood:1773]   Assessment Postpartum Postoperative Day # 1  Erika Lewis, [id number], [redacted]w[redacted]d, S/P Primary LT Cesarean Section with salpingectomy  due to Twin Breech. Pt was admitted on 9/28 for scheduled PCS for twin breech delivery at 36 weeks, pt was taken to  OR under spinal with Dr Normand Sloop on 9/28. Pt Had QBL with PPH of 1773, hgb drop of 9.6-7.9, declined blood, asymptomatic, will get IV Iron today, Baby Female in room and desires in pt circ prior to discharge. DM2 was on novolog 10units with meals during pregnancy and long acting was d/c during end of pregnancy r/t hypoglycemia, PP semglee at 5units for bedtime was restarted but no novolog, FBS was 130 with elevated PP therefore novolog 5units with meals was started back this morning. Pt on lovenox PP for VTE prophylactics for obesity. H/O CHTN was on procardia during pregnancy 30mg  xl but pt BP low over night 114/58, 111/58 therefore procardia discontinued.   Pt stable. -1 Involution. Breast and bottle Feeding. Hemodynamically Stable.  Plan: Continue other mgmt as ordered CHTN: Procardia 30mg  XL discontinued r/t BP 114/58, 111/58 ABLA  with PPH: Iron Iron transfusion today. DM2: insulin aspart (novoLOG) injection 5 Units with meals and semglee 5 units at bedtime, Personal CBG 2 hours PP and with meals.  VTE Prophylactics: SCD, ambulated as tolerates. Lovenox.  Pain control: Motrin/Tylenol/Narcotics PRN and abdominal belt ordered.  Education given regarding options for contraception, including Plan for discharge tomorrow, Breastfeeding, Lactation consult, and Circumcision prior to discharge  Dr. Normand Sloop to be updated on patient status  [facility] CNM, FNP-C, PMHNP-BC  [address]  [place], [place] [address]  Cell: [phone]  Office Phone: [phone] Fax: [phone] 06/30/2023  8:05 AM

## 2023-06-30 NOTE — Lactation Note (Signed)
This note was copied from a baby's chart. Lactation Consultation Note  Patient Name: Erika Lewis [id number] Date: 06/30/2023 Age:37 hours Reason for consult: Initial assessment;Late-preterm 34-36.6wks;Other (Comment);Multiple gestation;Maternal endocrine disorder (cHTN, IUGR, AMA)  Visited with family of 37 hours old LPI NICU twin female; Ms. Cranmer is a P3 and reports she hasn't started pumping yet. This LC set up a DEBP and she initiated pumping during [facility] consult, praised her for her efforts. Her plan is to do both, breast and formula; visitor fed baby a bottle during [facility] consult while Ms. Boulay was pumping. Reviewed LPI behavior, feeding cues, size of baby's stomach, pumping schedule, pump settings, lactogenesis II, supplementation and anticipatory guidelines.   Maternal Data Has patient been taught Hand Expression?: Yes Does the patient have breastfeeding experience prior to this delivery?: Yes How long did the patient breastfeed?: 2nd child for 1 month  Feeding Mother's Current Feeding Choice: Breast Milk and Formula Nipple Type: Slow - flow  Lactation Tools Discussed/Used Tools: Pump;Flanges;Coconut oil Flange Size: 21 Breast pump type: Double-Electric Breast Pump Pump Education: Setup, frequency, and cleaning;Milk Storage Reason for Pumping: LPI twins Pumping frequency: Initiated pumping at 24 hours post-partum; pumping q 3 hours (recommended) Pumped volume: 0 mL  Interventions Interventions: Breast feeding basics reviewed;Coconut oil;DEBP;Education;LC Services brochure  Plan Encouraged to continue putting baby +8 times/24 hours or sooner if feeding cues are present Encouraged pumping every 3 hours, ideally 8 pumping sessions/24 hours  Breast massage, hand expression and coconut oil were also encouraged prior pumping Family will continue supplementing with Similac 22 calorie formula every 3 hours after feedings/attempts at the breast   Female visitors present and  supportive. All questions and concerns answered, family to contact [facility] services PRN.  Discharge Pump: Personal (Lansinoh DEBP)  Consult Status Consult Status: Follow-up Date: 07/01/23 Follow-up type: In-patient   Velna Hedgecock Venetia Constable 06/30/2023, 1:14 PM

## 2023-07-01 ENCOUNTER — Encounter (HOSPITAL_COMMUNITY): Payer: Self-pay | Admitting: Obstetrics and Gynecology

## 2023-07-01 LAB — GLUCOSE, RANDOM: Glucose, Bld: 92 mg/dL (ref 70–99)

## 2023-07-01 LAB — GLUCOSE, CAPILLARY
Glucose-Capillary: 82 mg/dL (ref 70–99)
Glucose-Capillary: 110 mg/dL — ABNORMAL HIGH (ref 70–99)
Glucose-Capillary: 207 mg/dL — ABNORMAL HIGH (ref 70–99)

## 2023-07-01 MED ORDER — ACETAMINOPHEN 500 MG PO TABS
1000.0000 mg | ORAL_TABLET | Freq: Four times a day (QID) | ORAL | Status: DC
  Administered 2023-07-01 – 2023-07-04 (×11): 1000 mg via ORAL
  Filled 2023-07-01 (×13): qty 2

## 2023-07-01 MED ORDER — IBUPROFEN 600 MG PO TABS
600.0000 mg | ORAL_TABLET | Freq: Four times a day (QID) | ORAL | Status: DC
  Administered 2023-07-01 – 2023-07-04 (×11): 600 mg via ORAL
  Filled 2023-07-01 (×12): qty 1

## 2023-07-01 NOTE — Lactation Note (Signed)
This note was copied from a baby's chart. Lactation Consultation Note  Patient Name: Erika Lewis [id number] Date: 07/01/2023 Age:37 hours Birth Parent was having albuterol treatment and in a lot of pain, Birth Parent will call LC when ready for [facility] services.    Maternal Data     Feeding Nipple Type: Slow - flow  LATCH Score                    Lactation Tools Discussed/Used    Interventions    Discharge    Consult Status      Frederico Hamman 07/01/2023, 5:58 PM

## 2023-07-01 NOTE — Progress Notes (Signed)
Subjective: Postpartum Day 2: Cesarean Delivery Patient reports incisional pain, tolerating PO, + flatus, and no problems voiding.    Objective: Vital signs in last 24 hours: Temp:  [98.8 F (37.1 C)-99.3 F (37.4 C)] 99.3 F (37.4 C) (09/29 2103) Pulse Rate:  [83] 83 (09/30 0544) Resp:  [18-19] 18 (09/30 0544) BP: (142-149)/(76-81) 146/76 (09/30 0544) SpO2:  [96 %-100 %] 100 % (09/30 0544)     07/01/2023    5:44 AM 06/30/2023    9:03 PM 06/30/2023    3:03 PM  Vitals with BMI  Systolic 146 149 960  Diastolic 76 81 77  Pulse 83  83     I/O last 3 completed shifts: In: -  Out: 2225 [Urine:2225]   Physical Exam:  General: alert, cooperative, and moderate distress complaining of abdominal pain.  Lochia: appropriate Uterine Fundus: firm Abdomen: softly distended. With good bowel sounds on all 4 quadrants.  Incision: With clean, dry dressing.  DVT Evaluation: No evidence of DVT seen on physical exam. Calf/Ankle edema is present.  Recent Labs    06/29/23 1302 06/30/23 0505  HGB 9.1* 7.9*  HCT 28.6* 25.4*  CBG (last 3)  Recent Labs    06/30/23 1227 06/30/23 1805 07/01/23 0827  GLUCAP 144* 105* 82      Current Facility-Administered Medications:    acetaminophen (TYLENOL) tablet 1,000 mg, 1,000 mg, Oral, Q6H, Sallye Ober, Icelyn Navarrete, MD, 1,000 mg at 07/01/23 0900   acetaminophen (TYLENOL) tablet 650 mg, 650 mg, Oral, Q6H PRN, Dillard, Naima, MD, 650 mg at 06/30/23 2043   albuterol (PROVENTIL) (2.5 MG/3ML) 0.083% nebulizer solution 2.5 mg, 2.5 mg, Inhalation, Q6H PRN, Dillard, Naima, MD   budesonide (PULMICORT) nebulizer solution 0.25 mg, 0.25 mg, Nebulization, BID, Dillard, Naima, MD, 0.25 mg at 06/30/23 0818   coconut oil, 1 Application, Topical, PRN, Dillard, Naima, MD   witch hazel-glycerin (TUCKS) pad 1 Application, 1 Application, Topical, PRN **AND** dibucaine (NUPERCAINAL) 1 % rectal ointment 1 Application, 1 Application, Rectal, PRN, Dillard, Naima, MD   diphenhydrAMINE  (BENADRYL) injection 12.5 mg, 12.5 mg, Intravenous, Q4H PRN **OR** diphenhydrAMINE (BENADRYL) capsule 25 mg, 25 mg, Oral, Q4H PRN, Finucane, Elizabeth M, DO   diphenhydrAMINE (BENADRYL) capsule 25 mg, 25 mg, Oral, Q6H PRN, Dillard, Naima, MD   enoxaparin (LOVENOX) injection 60 mg, 60 mg, Subcutaneous, Q24H, Dillard, Naima, MD, 60 mg at 07/01/23 0829   ibuprofen (ADVIL) tablet 600 mg, 600 mg, Oral, Q6H, Jezabel Lecker, MD, 600 mg at 07/01/23 0901   insulin aspart (novoLOG) injection 5 Units, 5 Units, Subcutaneous, TID WC, [place], Curtis, FNP, 5 Units at 07/01/23 0901   insulin glargine-yfgn (SEMGLEE) injection 5 Units, 5 Units, Subcutaneous, QHS, Dillard, Naima, MD, 5 Units at 06/30/23 2308   lactated ringers infusion, , Intravenous, Continuous, Dillard, Naima, MD   menthol-cetylpyridinium (CEPACOL) lozenge 3 mg, 1 lozenge, Oral, Q2H PRN, Dillard, Naima, MD   naloxone (NARCAN) injection 0.4 mg, 0.4 mg, Intravenous, PRN **AND** sodium chloride flush (NS) 0.9 % injection 3 mL, 3 mL, Intravenous, PRN, Finucane, Elizabeth M, DO   naloxone HCl (NARCAN) 2 mg in dextrose 5 % 250 mL infusion, 1-2 mcg/kg/hr, Intravenous, Continuous PRN, Finucane, Elizabeth M, DO   NIFEdipine (PROCARDIA-XL/NIFEDICAL-XL) 24 hr tablet 30 mg, 30 mg, Oral, Daily, [place], Alvo, FNP, 30 mg at 07/01/23 0900   ondansetron (ZOFRAN) injection 4 mg, 4 mg, Intravenous, Q8H PRN, Finucane, Elizabeth M, DO, 4 mg at 06/30/23 1908   oxyCODONE (Oxy IR/ROXICODONE) immediate release tablet 5-10 mg, 5-10 mg, Oral, Q4H  PRN, Jaymes Graff, MD, 10 mg at 07/01/23 0549   pantoprazole (PROTONIX) EC tablet 40 mg, 40 mg, Oral, Daily, Dillard, Naima, MD, 40 mg at 07/01/23 0900   prenatal multivitamin tablet 1 tablet, 1 tablet, Oral, Q1200, Dillard, Naima, MD, 1 tablet at 06/30/23 1145   scopolamine (TRANSDERM-SCOP) 1 MG/3DAYS 1.5 mg, 1 patch, Transdermal, Once, Lacy Duverney M, DO, 1.5 mg at 06/29/23 1344   senna-docusate (Senokot-S) tablet 2 tablet, 2  tablet, Oral, Q24H, Dillard, Naima, MD, 2 tablet at 06/30/23 1756   simethicone (MYLICON) chewable tablet 80 mg, 80 mg, Oral, TID PC, Dillard, Naima, MD, 80 mg at 07/01/23 0830   simethicone (MYLICON) chewable tablet 80 mg, 80 mg, Oral, PRN, Dillard, Naima, MD, 80 mg at 06/30/23 2311   Tdap (BOOSTRIX) injection 0.5 mL, 0.5 mL, Intramuscular, Once, Dillard, Naima, MD   zolpidem (AMBIEN) tablet 5 mg, 5 mg, Oral, QHS PRN, Dillard, Naima, MD   Assessment/Plan: 37 y/o [id number] POD # 2 after a cesarean delivery for twin pregnancy in breech/breech presentation, - With chronic HTN and on Procardia 30 mg XL Q daily. - With type 2 DM and on semglee and novolog, f/u sugars fasting and 2 hour postprandial, may use her Dexcom values if similar to FSG checks. - Lovenox for DVT prophylaxis.  - S/p IV iron for anemia - Twin A is stable in NICU,Twin B boy is stable and in mother's room . - Oral ibuprofen and tylenol ordered scheduled. - Simethicone for gas pain and flatus.  - She is advised to continue with regular ambulation.    Prescilla Sours, MD 07/01/2023, 10:12 AM

## 2023-07-01 NOTE — Progress Notes (Signed)
Patient screened out for psychosocial assessment since none of the following apply:  Psychosocial stressors documented in mother or baby's chart  Gestation less than 32 weeks  Code at delivery   Infant with anomalies Please contact the Clinical Social Worker if specific needs arise, by MOB's request, or if MOB scores greater than 9/yes to question 10 on Edinburgh Postpartum Depression Screen.  Sui Kasparek, LCSW Clinical Social Worker [facility] Cell#: [phone]     

## 2023-07-01 NOTE — Lactation Note (Signed)
This note was copied from a baby's chart. Lactation Consultation Note  Patient Name: Erika Lewis [id number] Date: 07/01/2023 Age:37 hours Reason for consult: Follow-up assessment;Multiple gestation;Late-preterm 34-36.6wks.See Birth Parent's MR: (C/S delivery, AMA, IUGR, CHTN and Baby A in NICU).  Per Birth Parent, infant recently consume 30 mls of 22 kcal formula but wanted attempt to latch infant, infant only held nipple in mouth was  sleepy due to recently been given a bottle. Birth Parent has not been using her DEBP, explained that one side of the DEBP was not working. Birth Parent was open to pumping while LC was in the room. Birth Parent fitted with 21 mm breast flange and was pumping as LC left the room.  LC gave 'MY Care Plan card " did not see one in infant's crib for LPTI. LC discussed that latching infant at breast and pumping will help milk supply. Birth Parent was using the DEBP when LC left the room.  Current feeding plan: 1- Follow LPTI feeding guidelines, BF infant every 3 hours and limit chest feeding 15 minutes or less and total feeding ( breast and formula -bottle) 30 minutes or less. 2- Day 3 offer infant 28 mls or more if infant wants it per feeding . 3-Continue to pump every 3 hours for 15 minutes and give infant any EBM first before offering formula.    Maternal Data    Feeding Mother's Current Feeding Choice: Breast Milk and Formula  LATCH Score Latch: Too sleepy or reluctant, no latch achieved, no sucking elicited.  Audible Swallowing: None  Type of Nipple: Everted at rest and after stimulation  Comfort (Breast/Nipple): Soft / non-tender  Hold (Positioning): Assistance needed to correctly position infant at breast and maintain latch.  LATCH Score: 5   Lactation Tools Discussed/Used    Interventions Interventions: Skin to skin;Position options;Education;DEBP  Discharge    Consult Status Consult Status: Follow-up Date: 07/02/23 Follow-up type:  In-patient    Frederico Hamman 07/01/2023, 10:26 PM

## 2023-07-02 LAB — GLUCOSE, CAPILLARY
Glucose-Capillary: 138 mg/dL — ABNORMAL HIGH (ref 70–99)
Glucose-Capillary: 157 mg/dL — ABNORMAL HIGH (ref 70–99)
Glucose-Capillary: 165 mg/dL — ABNORMAL HIGH (ref 70–99)

## 2023-07-02 LAB — GLUCOSE, RANDOM: Glucose, Bld: 106 mg/dL — ABNORMAL HIGH (ref 70–99)

## 2023-07-02 LAB — SURGICAL PATHOLOGY

## 2023-07-02 MED ORDER — METOCLOPRAMIDE HCL 5 MG/5ML PO SOLN
10.0000 mg | Freq: Three times a day (TID) | ORAL | Status: DC
  Administered 2023-07-02 – 2023-07-04 (×6): 10 mg via ORAL
  Filled 2023-07-02 (×8): qty 10

## 2023-07-02 NOTE — Progress Notes (Signed)
Subjective: Postpartum Day 2: Cesarean Delivery Patient reports tolerating PO, + flatus, + BM and no problems voiding.    Objective: Vital signs in last 24 hours: Temp:  [98.7 F (37.1 C)-99.5 F (37.5 C)] 98.9 F (37.2 C) (10/01 0615) Pulse Rate:  [90-105] 90 (10/01 0615) Resp:  [16-18] 16 (10/01 0615) BP: (124-148)/(78-88) 138/79 (10/01 0615) SpO2:  [96 %-99 %] 96 % (10/01 0615)  Physical Exam:  General: alert Lochia: appropriate Uterine Fundus: firm Incision: healing well, no significant drainage DVT Evaluation: No evidence of DVT seen on physical exam. Abdomen good BS soft but distended  Recent Labs    06/30/23 0505  HGB 7.9*  HCT 25.4*    Assessment/Plan: Status post Cesarean section. Doing well postoperatively.  Continue current care. Will give reglan in hopes to decrease the distension Sonny Anthes A Simi Briel 07/02/2023, 2:17 PM

## 2023-07-02 NOTE — Lactation Note (Signed)
This note was copied from a baby's chart. Lactation Consultation Note  Patient Name: Erika Lewis [id number] Date: 07/02/2023 Age:37 hours Reason for consult: Follow-up assessment;Late-preterm 34-36.6wks;Maternal discharge;Maternal endocrine disorder;Other (Comment);Multiple gestation (cHTN, IUGR, AMA)  Visited with family of 37 hours old NICU twin female; Ms. Lemberger is a P3 and reports she's been pumping but not consistently. She has been latching baby "B" to the breast although it hasn't been at every feeding either. Family has been mainly working on bottle feedings. She might be getting discharge tomorrow. Reviewed pumping schedule, pump settings, LPI guidelines, discharge education and the importance of consistent pumping for the onset of lactogenesis II, the prevention of engorgement and to protect her supply.   Feeding Mother's Current Feeding Choice: Breast Milk and Formula  Lactation Tools Discussed/Used Tools: Pump;Flanges Flange Size: 21 Breast pump type: Double-Electric Breast Pump Pump Education: Setup, frequency, and cleaning;Milk Storage Reason for Pumping: LPI twins Pumping frequency: 2-3 times/24 hours Pumped volume:  (droplets)  Interventions Interventions: Breast feeding basics reviewed;DEBP;Education  Plan  Encouraged to continue putting baby +8 times/24 hours or sooner if feeding cues are present Encouraged pumping every 3 hours, ideally 8 pumping sessions or whenever baby is getting a bottle  Family will continue supplementing with Similac 22 calorie formula every 3 hours after feedings/attempts at the breast according to LPI policy   Female visitors present and supportive. All questions and concerns answered, family to contact [facility] services PRN.   Female visitors present and supportive. All questions and concerns answered, family to contact [facility] services PRN.  Discharge Discharge Education: Engorgement and breast care Pump: Personal (Lansinoh DEBP)  Consult  Status Consult Status: PRN Date: 07/03/23 Follow-up type: Call as needed   Roberto Romanoski Venetia Constable 07/02/2023, 4:27 PM

## 2023-07-02 NOTE — Lactation Note (Signed)
This note was copied from a baby's chart.  NICU Lactation Consultation Note  Patient Name: Erika Lewis [id number] Date: 07/02/2023 Age:37 hours  Reason for consult: Follow-up assessment; NICU baby; Multiple gestation; Other (Comment); Maternal endocrine disorder; Late-preterm 34-36.6wks; Infant < 6lbs; Maternal discharge (cHTN, IUGR, AMA) Type of Endocrine Disorder?: Diabetes (DM2 (insulin))  SUBJECTIVE Visited with family of 67 hours old NICU twin female; Erika Lewis is a P3 and reports she's been pumping but not consistently. She has been latching the other twin to the breast although it hasn't been at every feeding either. She might be getting discharge tomorrow. Reviewed pumping schedule, pump settings, discharge education and the importance of consistent pumping for the onset of lactogenesis II, the prevention of engorgement and to protect her supply.   OBJECTIVE Infant data: Mother's Current Feeding Choice: Breast Milk and Formula  O2 Device: Room Air  Infant feeding assessment Scale for Readiness: 3 Scale for Quality: 3   Maternal data: [id number] C-Section, Low Transverse Pumping frequency: 2-3 times/24 hours Pumped volume: 0 mL (droplets) Flange Size: 21  Pump: Personal (Lansinoh DEBP)  ASSESSMENT Infant: Feeding Status: Continuous gastric feedings Feeding method: Tube/Gavage (Bolus) Nipple Type: Nfant Slow Flow (purple)  Maternal: Milk volume: Normal  INTERVENTIONS/PLAN Interventions: Interventions: Breast feeding basics reviewed; DEBP; Education Discharge Education: Engorgement and breast care Tools: Pump; Flanges; Coconut oil Pump Education: Setup, frequency, and cleaning; Milk Storage  Plan: Encouraged pumping every 3 hours, ideally 8 pumping sessions/24 hours She'll take all pump parts to baby's room after her discharge She'll switch her pump settings from initiate to maintain mode once she starts getting 20 ml of EBM combined She'll call for  assistance when baby is ready to go to breast   Female visitors present and supportive. All questions and concerns answered, family to contact [facility] services PRN.  Consult Status: NICU follow-up NICU Follow-up type: Verify absence of engorgement; Verify onset of copious milk   Erika Lewis 07/02/2023, 4:14 PM

## 2023-07-02 NOTE — Discharge Instructions (Addendum)
Erika Lewis, 1. Do not do any heavy lifting, i.e nothing heavier than 15 lbs for the next 6 weeks.  2.  Do not use tampons or douche or take baths, do not have any sexual intercourse or anything inside the vagina for the next 6 weeks.  3. Take your pain medication as needed for pain, let us know if the pain is not well controlled despite pain medication use.  4. Take your iron tablets daily for anemia.  You may also take a stool softener e.g colace if you are constipated.    5.  If you get a fever while at home, do check your temperature and if it is equal to or greater than 100.4 please call the office.   6. Some vaginal bleeding is expected and normal after your delivery. Please let us know if if it excessive where you saturate 1 pad in less than 2 hours or so.  7. Please let us know if with depression or anxiety symptoms, or symptoms of uncontrolled blood pressure such as headache, vision changes, nausea, vomiting, chest pain, shortness of breath. 8.  While at home remember to walk regularly, at least half an hour to1 hour a day in addition to your activities of daily living, as this will help with your quick recovery 9. You may continue showering as usual and the steri strips may get wet.  After showering pat the steri strips dry. Allow the steri strips to fall off by themselves over time.  Do not rub the incision directly or put soap directly over the incision.  Always rinse off any soap over the incision and pat the incision dry.   10. Please check your sugars fasting and 2 hours after eating and admnister the novolog as per the sliding scale that will be printed out.  Please bring your glucose log to the office for review at your next office visit.   11.  Please call use if with any questions or concerns.   [facility] Phone: [phone].   CCS      [facility], [place] [phone]  OPEN ABDOMINAL SURGERY: POST OP INSTRUCTIONS  Always review your discharge  instruction sheet given to you by the facility where your surgery was performed.  IF YOU HAVE DISABILITY OR FAMILY LEAVE FORMS, YOU MUST BRING THEM TO THE OFFICE FOR PROCESSING.  PLEASE DO NOT GIVE THEM TO YOUR DOCTOR.  A prescription for pain medication may be given to you upon discharge.  Take your pain medication as prescribed, if needed.  If narcotic pain medicine is not needed, then you may take acetaminophen (Tylenol) or ibuprofen (Advil) as needed. Take your usually prescribed medications unless otherwise directed. If you need a refill on your pain medication, please contact your pharmacy. They will contact our office to request authorization.  Prescriptions will not be filled after 5pm or on week-ends. You should follow a light diet the first few days after arrival home, such as soup and crackers, pudding, etc.unless your doctor has advised otherwise. A high-fiber, low fat diet can be resumed as tolerated.   Be sure to include lots of fluids daily. Most patients will experience some swelling and bruising on the chest and neck area.  Ice packs will help.  Swelling and bruising can take several days to resolve Most patients will experience some swelling and bruising in the area of the incision. Ice pack will help. Swelling and bruising can take several days to resolve..  It is common to  experience some constipation if taking pain medication after surgery.  Increasing fluid intake and taking a stool softener will usually help or prevent this problem from occurring.  A mild laxative (Milk of Magnesia or Miralax) should be taken according to package directions if there are no bowel movements after 48 hours.  You may have steri-strips (small skin tapes) in place directly over the incision.  These strips should be left on the skin for 7-10 days.  If your surgeon used skin glue on the incision, you may shower in 24 hours.  The glue will flake off over the next 2-3 weeks.  Any sutures or staples will be  removed at the office during your follow-up visit. You may find that a light gauze bandage over your incision may keep your staples from being rubbed or pulled. You may shower and replace the bandage daily. ACTIVITIES:  You may resume regular (light) daily activities beginning the next day--such as daily self-care, walking, climbing stairs--gradually increasing activities as tolerated.  You may have sexual intercourse when it is comfortable.  Refrain from any heavy lifting or straining until approved by your doctor. You may drive when you no longer are taking prescription pain medication, you can comfortably wear a seatbelt, and you can safely maneuver your car and apply brakes  You should see your doctor in the office for a follow-up appointment approximately two weeks after your surgery.  Make sure that you call for this appointment within a day or two after you arrive home to insure a convenient appointment time.   WHEN TO CALL YOUR DOCTOR: Fever over 101.0 Inability to urinate Nausea and/or vomiting Extreme swelling or bruising Continued bleeding from incision. Increased pain, redness, or drainage from the incision. Difficulty swallowing or breathing Muscle cramping or spasms. Numbness or tingling in hands or feet or around lips.  The clinic staff is available to answer your questions during regular business hours.  Please don't hesitate to call and ask to speak to one of the nurses if you have concerns.  For further questions, please visit [email or web address]

## 2023-07-03 LAB — GLUCOSE, CAPILLARY
Glucose-Capillary: 140 mg/dL — ABNORMAL HIGH (ref 70–99)
Glucose-Capillary: 123 mg/dL — ABNORMAL HIGH (ref 70–99)
Glucose-Capillary: 122 mg/dL — ABNORMAL HIGH (ref 70–99)

## 2023-07-03 LAB — CBC WITH DIFFERENTIAL/PLATELET
Abs Immature Granulocytes: 0.1 10*3/uL — ABNORMAL HIGH (ref 0.00–0.07)
Basophils Absolute: 0 10*3/uL (ref 0.0–0.1)
Basophils Relative: 0 %
Eosinophils Absolute: 0 10*3/uL (ref 0.0–0.5)
Eosinophils Relative: 0 %
HCT: 33.1 % — ABNORMAL LOW (ref 36.0–46.0)
Hemoglobin: 10.4 g/dL — ABNORMAL LOW (ref 12.0–15.0)
Immature Granulocytes: 1 %
Lymphocytes Relative: 4 %
Lymphs Abs: 0.6 10*3/uL — ABNORMAL LOW (ref 0.7–4.0)
MCH: 24.5 pg — ABNORMAL LOW (ref 26.0–34.0)
MCHC: 31.4 g/dL (ref 30.0–36.0)
MCV: 77.9 fL — ABNORMAL LOW (ref 80.0–100.0)
Monocytes Absolute: 0.9 10*3/uL (ref 0.1–1.0)
Monocytes Relative: 7 %
Neutro Abs: 12.6 10*3/uL — ABNORMAL HIGH (ref 1.7–7.7)
Neutrophils Relative %: 88 %
Platelets: 209 10*3/uL (ref 150–400)
RBC: 4.25 MIL/uL (ref 3.87–5.11)
RDW: 29.7 % — ABNORMAL HIGH (ref 11.5–15.5)
WBC: 14.2 10*3/uL — ABNORMAL HIGH (ref 4.0–10.5)
nRBC: 0.4 % — ABNORMAL HIGH (ref 0.0–0.2)

## 2023-07-03 MED ORDER — SODIUM CHLORIDE 0.9 % IV SOLN
3.0000 g | Freq: Four times a day (QID) | INTRAVENOUS | Status: DC
  Administered 2023-07-03 – 2023-07-04 (×4): 3 g via INTRAVENOUS
  Filled 2023-07-03 (×3): qty 8
  Filled 2023-07-03: qty 3
  Filled 2023-07-03 (×2): qty 8

## 2023-07-03 MED ORDER — HYDROMORPHONE HCL 1 MG/ML IJ SOLN
0.5000 mg | INTRAMUSCULAR | Status: DC | PRN
  Administered 2023-07-04 (×2): 0.5 mg via INTRAVENOUS
  Filled 2023-07-03 (×2): qty 0.5

## 2023-07-03 NOTE — Progress Notes (Addendum)
Subjective: Postpartum Day 3: Cesarean Delivery Patient reports incisional pain, tolerating PO, + flatus, + BM, and no problems voiding.    Objective: Vital signs in last 24 hours: Temp:  [98.4 F (36.9 C)-100.6 F (38.1 C)] 100.5 F (38.1 C) (10/02 0547) Pulse Rate:  [96-115] 108 (10/02 0547) Resp:  [16] 16 (10/02 0459) BP: (138-151)/(75-87) 151/84 (10/02 0547) SpO2:  [98 %-99 %] 99 % (10/02 0459)    07/03/2023    5:47 AM 07/03/2023    4:59 AM 07/02/2023   10:38 PM  Vitals with BMI  Systolic 151 146 161  Diastolic 84 87 80  Pulse 108 108 96    Today's Vitals   07/03/23 0402 07/03/23 0459 07/03/23 0500 07/03/23 0547  BP:  (!) 146/87  (!) 151/84  Pulse:  (!) 108  (!) 108  Resp:  16    Temp:  (!) 100.6 F (38.1 C)  (!) 100.5 F (38.1 C)  TempSrc:  Oral  Oral  SpO2:  99%    Weight:      Height:      PainSc: Asleep  6  6    Body mass index is 43.62 kg/m.  Physical Exam:  General: alert, cooperative, and mild distress Lochia: appropriate Uterine Fundus: firm, tender. Abdomen: Moderately distended, soft, with bowel sounds on all 4 quadrants.  Incision: Honey comb dressing coming off, will get it changed.  DVT Evaluation: No evidence of DVT seen on physical exam. Calf/Ankle edema is present.  Recent Labs    07/03/23 0610  HGB 10.4*  HCT 33.1*      Latest Ref Rng & Units 07/03/2023    6:10 AM 06/30/2023    5:05 AM 06/29/2023    1:02 PM  CBC  WBC 4.0 - 10.5 K/uL 14.2  11.2  11.8   Hemoglobin 12.0 - 15.0 g/dL 09.6  7.9  9.1   Hematocrit 36.0 - 46.0 % 33.1  25.4  28.6   Platelets 150 - 400 K/uL 209  127  143      CBG (last 3)  Recent Labs    07/02/23 1228 07/02/23 2059 07/02/23 2247  GLUCAP 138* 157* 165*    Assessment/Plan: 36 y/o [id number] POD # 3 after a cesarean delivery for twin pregnancy in breech/breech presentation, with abdominal distension, likely due to gastrointestinal gas, with post-op fever, possibly due to endometritis, - Unasyn antibiotics  started and plan to continue until 24 to 48 hours afebrile. Follow up urine culture.  - With chronic HTN and on Procardia 30 mg XL Q daily. - With type 2 DM and on semglee and novolog, f/u sugars fasting and 2 hour postprandial, may use her Dexcom values if similar to FSG checks. - Lovenox for DVT prophylaxis.  - S/p IV iron for anemia - Twin A is stable in NICU,Twin B boy is stable and in mother's room . - She is advised to continue with regular ambulation.  - Simethicone and reglan as ordered.    Prescilla Sours, MD 07/03/2023, 9:34 AM

## 2023-07-03 NOTE — Progress Notes (Signed)
0700 Patient in shower at shift change could not assess vitals at that time. 0900 Dr. Gardenia Phlegm notified patients status and that patient was in shower and that I wanted to get a recent set of vitals signs before I called her however it was later than she had liked so I had apologized and explained I was waiting for patient to get out of the shower for reassessment of temerature and Blood pressure. Patient encouraged to ambulate halls. Patient states she has not been eating much Glucose 140 , 5 units of insulin given per order. There was no IV access at 0900 upon assessment L and D rapid called for IV start. First Unasyn given 1000.

## 2023-07-03 NOTE — Progress Notes (Signed)
Was informed Admission PPH score was not completed. Completed today

## 2023-07-04 ENCOUNTER — Inpatient Hospital Stay (HOSPITAL_COMMUNITY): Payer: Medicaid Other

## 2023-07-04 ENCOUNTER — Other Ambulatory Visit: Payer: Medicaid Other

## 2023-07-04 ENCOUNTER — Ambulatory Visit: Payer: Medicaid Other

## 2023-07-04 DIAGNOSIS — B348 Other viral infections of unspecified site: Secondary | ICD-10-CM | POA: Diagnosis not present

## 2023-07-04 LAB — HEMOGLOBIN A1C
Hgb A1c MFr Bld: 5.4 % (ref 4.8–5.6)
Mean Plasma Glucose: 108.28 mg/dL

## 2023-07-04 LAB — COMPREHENSIVE METABOLIC PANEL
ALT: 15 U/L (ref 0–44)
ALT: 17 U/L (ref 0–44)
AST: 21 U/L (ref 15–41)
AST: 12 U/L — ABNORMAL LOW (ref 15–41)
Albumin: 1.9 g/dL — ABNORMAL LOW (ref 3.5–5.0)
Albumin: 1.9 g/dL — ABNORMAL LOW (ref 3.5–5.0)
Alkaline Phosphatase: 69 U/L (ref 38–126)
Alkaline Phosphatase: 73 U/L (ref 38–126)
Anion gap: 14 (ref 5–15)
Anion gap: 18 — ABNORMAL HIGH (ref 5–15)
BUN: 17 mg/dL (ref 6–20)
BUN: 18 mg/dL (ref 6–20)
CO2: 26 mmol/L (ref 22–32)
CO2: 24 mmol/L (ref 22–32)
Calcium: 8.1 mg/dL — ABNORMAL LOW (ref 8.9–10.3)
Calcium: 8.2 mg/dL — ABNORMAL LOW (ref 8.9–10.3)
Chloride: 99 mmol/L (ref 98–111)
Chloride: 100 mmol/L (ref 98–111)
Creatinine, Ser: 1.2 mg/dL — ABNORMAL HIGH (ref 0.44–1.00)
Creatinine, Ser: 1.14 mg/dL — ABNORMAL HIGH (ref 0.44–1.00)
GFR, Estimated: >60 mL/min (ref >60)
GFR, Estimated: >60 mL/min (ref >60)
Glucose, Bld: 126 mg/dL — ABNORMAL HIGH (ref 70–99)
Glucose, Bld: 144 mg/dL — ABNORMAL HIGH (ref 70–99)
Potassium: 3.3 mmol/L — ABNORMAL LOW (ref 3.5–5.1)
Potassium: 3.1 mmol/L — ABNORMAL LOW (ref 3.5–5.1)
Sodium: 139 mmol/L (ref 135–145)
Sodium: 142 mmol/L (ref 135–145)
Total Bilirubin: 0.8 mg/dL (ref 0.3–1.2)
Total Bilirubin: 0.9 mg/dL (ref 0.3–1.2)
Total Protein: 5.3 g/dL — ABNORMAL LOW (ref 6.5–8.1)
Total Protein: 5.6 g/dL — ABNORMAL LOW (ref 6.5–8.1)

## 2023-07-04 LAB — CBC WITH DIFFERENTIAL/PLATELET
Abs Immature Granulocytes: 0 10*3/uL (ref 0.00–0.07)
Basophils Absolute: 0 10*3/uL (ref 0.0–0.1)
Basophils Relative: 0 %
Eosinophils Absolute: 0 10*3/uL (ref 0.0–0.5)
Eosinophils Relative: 0 %
HCT: 28.8 % — ABNORMAL LOW (ref 36.0–46.0)
Hemoglobin: 9.1 g/dL — ABNORMAL LOW (ref 12.0–15.0)
Lymphocytes Relative: 9 %
Lymphs Abs: 0.9 10*3/uL (ref 0.7–4.0)
MCH: 25.1 pg — ABNORMAL LOW (ref 26.0–34.0)
MCHC: 31.6 g/dL (ref 30.0–36.0)
MCV: 79.6 fL — ABNORMAL LOW (ref 80.0–100.0)
Monocytes Absolute: 1.1 10*3/uL — ABNORMAL HIGH (ref 0.1–1.0)
Monocytes Relative: 11 %
Neutro Abs: 8.2 10*3/uL — ABNORMAL HIGH (ref 1.7–7.7)
Neutrophils Relative %: 80 %
Platelets: 340 10*3/uL (ref 150–400)
RBC: 3.62 MIL/uL — ABNORMAL LOW (ref 3.87–5.11)
RDW: 28.6 % — ABNORMAL HIGH (ref 11.5–15.5)
WBC: 10.2 10*3/uL (ref 4.0–10.5)
nRBC: 0.3 % — ABNORMAL HIGH (ref 0.0–0.2)
nRBC: 2 /100{WBCs} — ABNORMAL HIGH

## 2023-07-04 LAB — LACTIC ACID, PLASMA
Lactic Acid, Venous: 1.9 mmol/L (ref 0.5–1.9)
Lactic Acid, Venous: 1.5 mmol/L (ref 0.5–1.9)

## 2023-07-04 LAB — GLUCOSE, CAPILLARY
Glucose-Capillary: 149 mg/dL — ABNORMAL HIGH (ref 70–99)
Glucose-Capillary: 134 mg/dL — ABNORMAL HIGH (ref 70–99)
Glucose-Capillary: 147 mg/dL — ABNORMAL HIGH (ref 70–99)
Glucose-Capillary: 137 mg/dL — ABNORMAL HIGH (ref 70–99)
Glucose-Capillary: 143 mg/dL — ABNORMAL HIGH (ref 70–99)
Glucose-Capillary: 151 mg/dL — ABNORMAL HIGH (ref 70–99)
Glucose-Capillary: 135 mg/dL — ABNORMAL HIGH (ref 70–99)

## 2023-07-04 LAB — RESPIRATORY PANEL BY PCR

## 2023-07-04 LAB — PROTIME-INR
INR: 1.2 (ref 0.8–1.2)
Prothrombin Time: 15.7 s — ABNORMAL HIGH (ref 11.4–15.2)

## 2023-07-04 LAB — CULTURE, OB URINE

## 2023-07-04 LAB — APTT: aPTT: 34 s (ref 24–36)

## 2023-07-04 MED ORDER — DEXTROSE 50 % IV SOLN: 0.0000 mL | INTRAVENOUS | Status: DC | PRN

## 2023-07-04 MED ORDER — ACETAMINOPHEN 10 MG/ML IV SOLN
1000.0000 mg | Freq: Four times a day (QID) | INTRAVENOUS | Status: AC | PRN
  Administered 2023-07-04 – 2023-07-05 (×2): 1000 mg via INTRAVENOUS
  Filled 2023-07-04 (×3): qty 100

## 2023-07-04 MED ORDER — NIFEDIPINE ER OSMOTIC RELEASE 60 MG PO TB24: 60.0000 mg | ORAL_TABLET | Freq: Every day | ORAL | Status: DC

## 2023-07-04 MED ORDER — POTASSIUM CHLORIDE 2 MEQ/ML IV SOLN
INTRAVENOUS | Status: DC
  Filled 2023-07-04: qty 1000

## 2023-07-04 MED ORDER — PANTOPRAZOLE SODIUM 40 MG IV SOLR: 40.0000 mg | Freq: Once | INTRAVENOUS | Status: DC

## 2023-07-04 MED ORDER — IOHEXOL 350 MG/ML SOLN
65.0000 mL | Freq: Once | INTRAVENOUS | Status: AC | PRN
  Administered 2023-07-04: 65 mL via INTRAVENOUS

## 2023-07-04 MED ORDER — LACTATED RINGERS IV SOLN: INTRAVENOUS | Status: DC

## 2023-07-04 MED ORDER — SODIUM CHLORIDE 0.9 % IV SOLN
25.0000 mg | Freq: Once | INTRAVENOUS | Status: AC
  Administered 2023-07-04: 25 mg via INTRAVENOUS
  Filled 2023-07-04: qty 25

## 2023-07-04 MED ORDER — IOHEXOL 9 MG/ML PO SOLN
500.0000 mL | ORAL | Status: AC
  Administered 2023-07-04: 500 mL via ORAL

## 2023-07-04 MED ORDER — HYDRALAZINE HCL 20 MG/ML IJ SOLN
5.0000 mg | INTRAMUSCULAR | Status: DC | PRN
  Administered 2023-07-04 – 2023-07-05 (×2): 5 mg via INTRAVENOUS
  Filled 2023-07-04 (×2): qty 1

## 2023-07-04 MED ORDER — LABETALOL HCL 5 MG/ML IV SOLN: 40.0000 mg | INTRAVENOUS | Status: DC | PRN

## 2023-07-04 MED ORDER — HYDRALAZINE HCL 20 MG/ML IJ SOLN: 10.0000 mg | INTRAMUSCULAR | Status: DC | PRN

## 2023-07-04 MED ORDER — INSULIN REGULAR(HUMAN) IN NACL 100-0.9 UT/100ML-% IV SOLN
INTRAVENOUS | Status: DC
  Administered 2023-07-04: 1.6 [IU]/h via INTRAVENOUS
  Filled 2023-07-04: qty 100

## 2023-07-04 MED ORDER — METOCLOPRAMIDE HCL 5 MG/ML IJ SOLN
10.0000 mg | Freq: Four times a day (QID) | INTRAMUSCULAR | Status: DC
  Administered 2023-07-04: 10 mg via INTRAVENOUS
  Filled 2023-07-04: qty 2

## 2023-07-04 MED ORDER — METRONIDAZOLE 500 MG/100ML IV SOLN: 500.0000 mg | Freq: Three times a day (TID) | INTRAVENOUS | Status: DC

## 2023-07-04 MED ORDER — POTASSIUM CHLORIDE 2 MEQ/ML IV SOLN
INTRAVENOUS | Status: AC
  Filled 2023-07-04: qty 1000

## 2023-07-04 MED ORDER — LABETALOL HCL 5 MG/ML IV SOLN: 20.0000 mg | INTRAVENOUS | Status: DC | PRN

## 2023-07-04 MED ORDER — GUAIFENESIN 100 MG/5ML PO LIQD
10.0000 mL | ORAL | Status: DC | PRN
  Administered 2023-07-04 – 2023-07-08 (×3): 10 mL via ORAL
  Filled 2023-07-04: qty 15
  Filled 2023-07-04: qty 10
  Filled 2023-07-04: qty 15
  Filled 2023-07-04: qty 10

## 2023-07-04 MED ORDER — FAMOTIDINE 20 MG PO TABS
20.0000 mg | ORAL_TABLET | Freq: Every day | ORAL | Status: DC
  Administered 2023-07-04: 20 mg via ORAL
  Filled 2023-07-04: qty 1

## 2023-07-04 MED ORDER — KETOROLAC TROMETHAMINE 30 MG/ML IJ SOLN
30.0000 mg | Freq: Three times a day (TID) | INTRAMUSCULAR | Status: DC | PRN
  Administered 2023-07-04: 30 mg via INTRAVENOUS
  Filled 2023-07-04: qty 1

## 2023-07-04 MED ORDER — SODIUM CHLORIDE 0.9 % IV SOLN
2.0000 g | Freq: Three times a day (TID) | INTRAVENOUS | Status: DC
  Administered 2023-07-04 – 2023-07-08 (×11): 2 g via INTRAVENOUS
  Filled 2023-07-04 (×12): qty 12.5

## 2023-07-04 MED ORDER — PANTOPRAZOLE SODIUM 40 MG IV SOLR
40.0000 mg | Freq: Every day | INTRAVENOUS | Status: DC
  Administered 2023-07-04: 40 mg via INTRAVENOUS
  Filled 2023-07-04 (×2): qty 10

## 2023-07-04 MED ORDER — DEXTROSE IN LACTATED RINGERS 5 % IV SOLN: INTRAVENOUS | Status: DC

## 2023-07-04 MED ORDER — FUROSEMIDE 20 MG PO TABS
40.0000 mg | ORAL_TABLET | Freq: Once | ORAL | Status: AC
  Administered 2023-07-04: 40 mg via ORAL
  Filled 2023-07-04: qty 2

## 2023-07-04 NOTE — Sepsis Progress Note (Signed)
Elink monitoring for the code sepsis protocol.  

## 2023-07-04 NOTE — Progress Notes (Addendum)
Erika Lewis [id number] Postpartum Postoperative Day # 4  Donaciano Eva, [id number], [redacted]w[redacted]d, S/P Primary LT Cesarean Section with salpingectomy  due to Twin Breech. Pt was admitted on 9/28 for scheduled PCS for twin breech delivery at 36 weeks, pt was taken to OR under spinal with Dr Normand Sloop on 9/28. Pt Had QBL with PPH of 1773, hgb drop of 9.6-7.9, declined blood, asymptomatic, got IV iron on then increased to 10.4, Baby Female in room got in pt circ done. DM2 was on novolog 10units with meals during pregnancy and long acting was d/c during end of pregnancy r/t hypoglycemia, PP semglee at 5units for bedtime was restarted but no novolog, FBS was 130 with elevated PP therefore novolog 5units with meals was started back on 9/30, since CBG more controlled. Pt on lovenox PP for VTE prophylactics for obesity. H/O CHTN was on procardia during pregnancy 30mg  xl but pt BP low over night 114/58, 111/58 therefore procardia discontinued on 9/30 but restarted that same day for elevated Bps. 10/2 having distended abdomen with tenderness, not moving around much, spiked fever 100.5 @ 0530, suspected to have endometritis, then 10/3 started having "feeling like lungs are wet", positive for cough and after 24 hours Unasyn, pt was tested and positive for rhinovirus (suspected fever was from rhinovirus and not endometritis) , required 2L [place] for sat 88, now on RA 95, chest xray ordered and IMPRESSION: 1. Cardiomegaly with mild vascular congestion. 2. Bibasilar densities, right side greater than left. Findings are suggestive for atelectasis and right pleural fluid.   Pt stable. -1 Involution. Breast and bottle Feeding. Hemodynamically Stable.  Patient Active Problem List   Diagnosis Date Noted   Rhinovirus 07/04/2023   Chronic hypertension affecting pregnancy 06/29/2023   S/P cesarean section 06/29/2023   Acute postoperative anemia due to greater than expected blood loss 06/29/2023   Postpartum hemorrhage 06/29/2023    Abnormal fetal ultrasound 05/29/2023   Uterine fibroid complicating antenatal care, baby not yet delivered 05/29/2023   Known fetal anomaly, fetus 1: CPAM, pelvic cyst 04/18/2023   Known fetal anomaly, antepartum, fetus 2 (Absent nasal bone, UTDA1), antepartum 04/18/2023   AMA (advanced maternal age) multigravida 35+ 03/15/2023   Chronic hypertension during pregnancy, antepartum 03/15/2023   Type 2 diabetes mellitus (HCC)-Insulin 02/19/2023   Carrier of genetic disorder 01/17/2023   Twin pregnancy 12/19/2022   Obesity with body mass index 30 or greater 03/30/2019   Sickle cell trait (HCC) 08/25/2014     Active Ambulatory Problems    Diagnosis Date Noted   Sickle cell trait (HCC) 08/25/2014   Obesity with body mass index 30 or greater 03/30/2019   Twin pregnancy 12/19/2022   AMA (advanced maternal age) multigravida 35+ 03/15/2023   Chronic hypertension during pregnancy, antepartum 03/15/2023   Carrier of genetic disorder 01/17/2023   Type 2 diabetes mellitus (HCC)-Insulin 02/19/2023   Known fetal anomaly, fetus 1: CPAM, pelvic cyst 04/18/2023   Known fetal anomaly, antepartum, fetus 2 (Absent nasal bone, UTDA1), antepartum 04/18/2023   Abnormal fetal ultrasound 05/29/2023   Uterine fibroid complicating antenatal care, baby not yet delivered 05/29/2023   Resolved Ambulatory Problems    Diagnosis Date Noted   Post-dates pregnancy 08/24/2014   Asthma 08/25/2014   Uterine leiomyoma 08/25/2014   Vaginal delivery 08/25/2014   Low grade squamous intraepithelial lesion (LGSIL) on cervicovaginal cytologic smear 03/30/2019   Anemia 03/30/2019   Subchorionic hematoma 12/19/2022   Pregnancy 12/19/2022   Vitamin D deficiency 01/05/2023   Past Medical  History:  Diagnosis Date   Asthma    Diabetes mellitus without complication (HCC)    Hx of varicella    Hypertension      Subjective: Patient up ad lib but has not been moving much r/t abdominal pain, encourage abdominal binder and  walks, denies syncope or dizziness. Reports consuming liquids only over the night and did have N/V x2 bouts last night and one green emsesi after taking robutussin this morning , refused Reglan last night r/t feeling that is what made the pt sick. Patient reports 1 bowel movement + passing flatus.  Denies issues with urination and reports bleeding is "lighter."  Patient is bottle feeding and reports going well.  Desires salpingectomy done during PCS for postpartum contraception.  Pain is being appropriately managed with use of po meds. Denies HA, RUQ pain , vision changes, SOB, heart palpitations, cp. Pt does endorses having new cough and mild "feeling like my lungs are wet", was desating while sitting on the side of the bed, required 2L [place] for 2 hours then RA and 95, pt admitted to always sating in the 95s at home, especially during asthma or viral episodes, pt is +for rhinovirus.    Objective: Patient Vitals for the past 24 hrs:  BP Temp Temp src Pulse Resp SpO2  07/04/23 0700 -- -- -- -- -- 95 %  07/04/23 0600 -- -- -- (!) 110 -- 95 %  07/04/23 0450 -- -- -- (!) 109 -- 94 %  07/04/23 0430 -- -- -- (!) 112 -- 93 %  07/04/23 0420 -- -- -- -- -- 90 %  07/04/23 0350 133/74 99.3 F (37.4 C) Oral (!) 135 20 (!) 87 %  07/03/23 2155 -- -- -- -- -- 100 %  07/03/23 2015 (!) 150/80 99.1 F (37.3 C) Oral (!) 107 18 94 %  07/03/23 1437 120/81 98.9 F (37.2 C) Oral (!) 120 18 96 %     Physical Exam:  General: alert, cooperative, and appears stated age Mood/Affect: Tired, Flat Lungs: clear to auscultation, no wheezes, rales or rhonchi, symmetric air entry.  Heart: normal rate, regular rhythm, normal S1, S2, no murmurs, rubs, clicks or gallops. Breast: breasts appear normal, no suspicious masses, no skin or nipple changes or axillary nodes. Abdomen:  + bowel sounds, soft, non-tender throughout ecept slight tenderness on right side, abdomen is soft but distended.  Incision: healing well, no significant  drainage, no dehiscence, Honeycomb dressing  Uterine Fundus: firm, involution -2 Lochia: appropriate Skin: Warm, Dry. DVT Evaluation: No evidence of DVT seen on physical exam. Negative Homan's sign. No cords or calf tenderness.  Labs: Recent Labs    07/03/23 0610  HGB 10.4*  HCT 33.1*  WBC 14.2*    CBG (last 3)  Recent Labs    07/03/23 0946 07/03/23 1643 07/03/23 2227  GLUCAP 140* 123* 122*     I/O: No intake/output data recorded.   Assessment Postpartum Postoperative Day # 4  Donaciano Eva, [id number], [redacted]w[redacted]d, S/P Primary LT Cesarean Section with salpingectomy  due to Twin Breech. Pt was admitted on 9/28 for scheduled PCS for twin breech delivery at 36 weeks, pt was taken to OR under spinal with Dr Normand Sloop on 9/28. Pt Had QBL with PPH of 1773, hgb drop of 9.6-7.9, declined blood, asymptomatic, got IV iron on then increased to 10.4, Baby Female in room got in pt circ done. DM2 was on novolog 10units with meals during pregnancy and long acting was d/c during end of  pregnancy r/t hypoglycemia, PP semglee at 5units for bedtime was restarted but no novolog, FBS was 130 with elevated PP therefore novolog 5units with meals was started back on 9/30, since CBG more controlled. Pt on lovenox PP for VTE prophylactics for obesity. H/O CHTN was on procardia during pregnancy 30mg  xl but pt BP low over night 114/58, 111/58 therefore procardia discontinued on 9/30 but restarted that same day for elevated Bps. 10/2 having distended abdomen with tenderness, not moving around much, spiked fever 100.5 @ 0530, suspected to have endometritis, then 10/3 started having "feeling like lungs are wet", positive for cough and after 24 hours Unasyn, pt was tested and positive for rhinovirus (suspected fever was from rhinovirus and not endometritis) , required 2L [place] for sat 88, now on RA 95, chest xray ordered and IMPRESSION: 1. Cardiomegaly with mild vascular congestion. 2. Bibasilar densities, right side greater  than left. Findings are suggestive for atelectasis and right pleural fluid.   Pt stable. -1 Involution. Breast and bottle Feeding. Hemodynamically Stable.  Plan: Continue other mgmt as ordered Rhinovirus: robitussin Q4, incentive spirometry TID, Pulmicort and albuterol treatments. Pleural fluid with mild atelectasis will get 40mg  lasix x1 dose today.  Nausea/Abdominal pain: walk the halls with mask 4 times daily. Reglan and protonix. Clear liquid diet. Ordered KUB to r/o illius. UC pending and CMP.  CHTN: Procardia 30mg  XL ABLA with PPH: PO Iron DM2: Stop  insulin aspart (novoLOG) injection 5 Units with meals and semglee 5 units at bedtime, Personal CBG 2 hours PP and with meals. May resume once diet is back to regular.  VTE Prophylactics: SCD, ambulated as tolerates. Lovenox.  Pain control: Motrin/Tylenol/Narcotics PRN and abdominal belt ordered.  Education given regarding options for contraception, including Plan for discharge tomorrow  Dr. Normand Sloop  updated on patient status and request pt to stay in house for 1 more day to ensure pt is able to function at home and medically cleared.   [facility], [place] CNM, FNP-C, PMHNP-BC  [address]  [place], [place] [address]  Cell: [phone]  Office Phone: [phone] Fax: [phone] 07/04/2023  10:03 AM

## 2023-07-04 NOTE — Progress Notes (Signed)
Pharmacy Antibiotic Note  Erika Lewis is a 37 y.o. female admitted on 06/29/2023 for C-section of twins at 36 weeks complicated with possible fascial dehiscense vs bowel injury.  Pharmacy has been consulted for Cefepime dosing for sepsis protocol with intra-abdominal infection.   Plan: Cefepime 2 gram IV q8h  Height: 5\' 4"  (162.6 cm) Weight: 115.3 kg (254 lb 1.6 oz) IBW/kg (Calculated) : 54.7  Temp (24hrs), Avg:99.7 F (37.6 C), Min:99.1 F (37.3 C), Max:100.7 F (38.2 C)  Recent Labs  Lab 06/28/23 0933 06/29/23 1302 06/30/23 0505 07/03/23 0610 07/04/23 1101  WBC 6.6 11.8* 11.2* 14.2*  --   CREATININE  --   --  0.89  --  1.20*    Estimated Creatinine Clearance: 80.7 mL/min (A) (by C-G formula based on SCr of 1.2 mg/dL (H)).    No Known Allergies  Antimicrobials this admission: Metronidazole 500mg  IV q8h 10/3 >> Unasyn 3 grams IV q6h 10/2-10/3 Azithromycin 500mg  IV x 1 preop 9/28 Cefazolin 2 gram IV preop x 1 9/28    Thank you for allowing pharmacy to be a part of this patient's care.  Claybon Jabs 07/04/2023 7:34 PM

## 2023-07-04 NOTE — Consult Note (Addendum)
CC/Reason for consult: Ileus/SBO after c-section  Requesting physician: Jaymes Graff, MD  HPI: Erika Lewis is an 37 y.o. female with hx of HTN, DM, asthma, sickle cell trait. She has had recent rhinovirus and is on droplet precautions at present, dx today. By report, she underwent C-section for twins 06/29/23. She has been passing flatus and had BM earlier today. Abdominal distention noted postoperatively. She had XR today showing dilated small bowel to 7 cm. We are asked to see.  She currently reports nausea/vomiting. BM today. Incisional soreness. No drainage.  She has had some degree of cough, fever, congestion  Past Medical History:  Diagnosis Date   Anemia 03/30/2019   Asthma    Asthma 08/25/2014   Diabetes mellitus without complication (HCC)    Hx of varicella    Hypertension    Low grade squamous intraepithelial lesion (LGSIL) on cervicovaginal cytologic smear 03/30/2019   Pregnancy 12/19/2022   Sickle cell trait (HCC)    Subchorionic hematoma 12/19/2022   Moderate size, 4.3 x 3.1 x 2, noted at 7 1/7 week [place].   Uterine leiomyoma 08/25/2014   Vitamin D deficiency 01/05/2023   Noted at [facility], will rx supplement. Noted at [facility], rx'd supplement.      Past Surgical History:  Procedure Laterality Date   CESAREAN SECTION N/A 06/29/2023   Procedure: CESAREAN SECTION;  Surgeon: Jaymes Graff, MD;  Location: [facility];  Service: Obstetrics;  Laterality: N/A;   NO PAST SURGERIES      Family History  Problem Relation Age of Onset   Hypertension Mother    Diabetes Mother    Sickle cell trait Mother    Sickle cell trait Sister    Sickle cell trait Son    Sickle cell trait Maternal Aunt    Cancer Maternal Grandmother    Diabetes Maternal Grandmother    Hypertension Maternal Grandmother    Sickle cell trait Maternal Grandmother    Asthma Neg Hx    Heart disease Neg Hx     Social:  reports that she has never smoked. She has never used smokeless tobacco. She reports  that she does not drink alcohol and does not use drugs.  Allergies: No Known Allergies  Medications: I have reviewed the patient's current medications.  Results for orders placed or performed during the hospital encounter of 06/29/23 (from the past 48 hour(s))  Glucose, capillary     Status: Abnormal   Collection Time: 07/02/23  8:59 PM  Result Value Ref Range   Glucose-Capillary 157 (H) 70 - 99 mg/dL    Comment: Glucose reference range applies only to samples taken after fasting for at least 8 hours.  Glucose, capillary     Status: Abnormal   Collection Time: 07/02/23 10:47 PM  Result Value Ref Range   Glucose-Capillary 165 (H) 70 - 99 mg/dL    Comment: Glucose reference range applies only to samples taken after fasting for at least 8 hours.  Culture, OB Urine     Status: Abnormal   Collection Time: 07/03/23  5:57 AM   Specimen: Urine, Random  Result Value Ref Range   Specimen Description URINE, RANDOM    Special Requests NONE    Culture (A)     MULTIPLE SPECIES PRESENT, SUGGEST RECOLLECTION NO GROUP B STREP (S.AGALACTIAE) ISOLATED Performed at [facility], [address] [place], [place] [address]    Report Status 07/04/2023 FINAL   CBC with Differential/Platelet     Status: Abnormal  Collection Time: 07/03/23  6:10 AM  Result Value Ref Range   WBC 14.2 (H) 4.0 - 10.5 K/uL   RBC 4.25 3.87 - 5.11 MIL/uL   Hemoglobin 10.4 (L) 12.0 - 15.0 g/dL   HCT 16.1 (L) 09.6 - 04.5 %   MCV 77.9 (L) 80.0 - 100.0 fL   MCH 24.5 (L) 26.0 - 34.0 pg   MCHC 31.4 30.0 - 36.0 g/dL   RDW 40.9 (H) 81.1 - 91.4 %   Platelets 209 150 - 400 K/uL    Comment: REPEATED TO VERIFY   nRBC 0.4 (H) 0.0 - 0.2 %   Neutrophils Relative % 88 %   Neutro Abs 12.6 (H) 1.7 - 7.7 K/uL   Lymphocytes Relative 4 %   Lymphs Abs 0.6 (L) 0.7 - 4.0 K/uL   Monocytes Relative 7 %   Monocytes Absolute 0.9 0.1 - 1.0 K/uL   Eosinophils Relative 0 %   Eosinophils Absolute 0.0 0.0 - 0.5 K/uL   Basophils Relative 0  %   Basophils Absolute 0.0 0.0 - 0.1 K/uL   Immature Granulocytes 1 %   Abs Immature Granulocytes 0.10 (H) 0.00 - 0.07 K/uL    Comment: Performed at [facility], [address] [place], [place] [address]  Glucose, capillary     Status: Abnormal   Collection Time: 07/03/23  9:46 AM  Result Value Ref Range   Glucose-Capillary 140 (H) 70 - 99 mg/dL    Comment: Glucose reference range applies only to samples taken after fasting for at least 8 hours.  Glucose, capillary     Status: Abnormal   Collection Time: 07/03/23  4:43 PM  Result Value Ref Range   Glucose-Capillary 123 (H) 70 - 99 mg/dL    Comment: Glucose reference range applies only to samples taken after fasting for at least 8 hours.  Glucose, capillary     Status: Abnormal   Collection Time: 07/03/23 10:27 PM  Result Value Ref Range   Glucose-Capillary 122 (H) 70 - 99 mg/dL    Comment: Glucose reference range applies only to samples taken after fasting for at least 8 hours.  Respiratory (~20 pathogens) panel by PCR     Status: Abnormal   Collection Time: 07/04/23  4:30 AM   Specimen: Nasopharyngeal Swab; Respiratory  Result Value Ref Range   Adenovirus NOT DETECTED NOT DETECTED   Coronavirus 229E NOT DETECTED NOT DETECTED    Comment: (NOTE) The Coronavirus on the Respiratory Panel, DOES NOT test for the novel  Coronavirus (2019 nCoV)    Coronavirus HKU1 NOT DETECTED NOT DETECTED   Coronavirus NL63 NOT DETECTED NOT DETECTED   Coronavirus OC43 NOT DETECTED NOT DETECTED   Metapneumovirus NOT DETECTED NOT DETECTED   Rhinovirus / Enterovirus DETECTED (A) NOT DETECTED   Influenza A NOT DETECTED NOT DETECTED   Influenza B NOT DETECTED NOT DETECTED   Parainfluenza Virus 1 NOT DETECTED NOT DETECTED   Parainfluenza Virus 2 NOT DETECTED NOT DETECTED   Parainfluenza Virus 3 NOT DETECTED NOT DETECTED   Parainfluenza Virus 4 NOT DETECTED NOT DETECTED   Respiratory Syncytial Virus NOT DETECTED NOT DETECTED   Bordetella  pertussis NOT DETECTED NOT DETECTED   Bordetella Parapertussis NOT DETECTED NOT DETECTED   Chlamydophila pneumoniae NOT DETECTED NOT DETECTED   Mycoplasma pneumoniae NOT DETECTED NOT DETECTED    Comment: Performed at [facility], [address] [place], [place] [address]  Comprehensive metabolic panel     Status: Abnormal   Collection  Time: 07/04/23 11:01 AM  Result Value Ref Range   Sodium 139 135 - 145 mmol/L   Potassium 3.3 (L) 3.5 - 5.1 mmol/L   Chloride 99 98 - 111 mmol/L   CO2 26 22 - 32 mmol/L   Glucose, Bld 126 (H) 70 - 99 mg/dL    Comment: Glucose reference range applies only to samples taken after fasting for at least 8 hours.   BUN 17 6 - 20 mg/dL   Creatinine, Ser 1.61 (H) 0.44 - 1.00 mg/dL   Calcium 8.1 (L) 8.9 - 10.3 mg/dL   Total Protein 5.3 (L) 6.5 - 8.1 g/dL   Albumin 1.9 (L) 3.5 - 5.0 g/dL   AST 21 15 - 41 U/L   ALT 15 0 - 44 U/L   Alkaline Phosphatase 69 38 - 126 U/L   Total Bilirubin 0.8 0.3 - 1.2 mg/dL   GFR, Estimated >09 >60 mL/min    Comment: (NOTE) Calculated using the CKD-EPI Creatinine Equation (2021)    Anion gap 14 5 - 15    Comment: Performed at [facility], [address] [place], [place] [address]  Hemoglobin A1c     Status: None   Collection Time: 07/04/23 12:17 PM  Result Value Ref Range   Hgb A1c MFr Bld 5.4 4.8 - 5.6 %    Comment: (NOTE) Pre diabetes:          5.7%-6.4%  Diabetes:              >6.4%  Glycemic control for   <7.0% adults with diabetes    Mean Plasma Glucose 108.28 mg/dL    Comment: Performed at [facility], [address], [place], [place] [address]  Glucose, capillary     Status: Abnormal   Collection Time: 07/04/23 12:58 PM  Result Value Ref Range   Glucose-Capillary 149 (H) 70 - 99 mg/dL    Comment: Glucose reference range applies only to samples taken after fasting for at least 8 hours.  Glucose, capillary     Status: Abnormal   Collection Time: 07/04/23  1:52 PM  Result Value Ref Range    Glucose-Capillary 134 (H) 70 - 99 mg/dL    Comment: Glucose reference range applies only to samples taken after fasting for at least 8 hours.  Glucose, capillary     Status: Abnormal   Collection Time: 07/04/23  2:52 PM  Result Value Ref Range   Glucose-Capillary 147 (H) 70 - 99 mg/dL    Comment: Glucose reference range applies only to samples taken after fasting for at least 8 hours.  Glucose, capillary     Status: Abnormal   Collection Time: 07/04/23  4:04 PM  Result Value Ref Range   Glucose-Capillary 137 (H) 70 - 99 mg/dL    Comment: Glucose reference range applies only to samples taken after fasting for at least 8 hours.    DG Abd 1 View  Result Date: 07/04/2023 CLINICAL DATA:  Vomiting.  Pain EXAM: ABDOMEN - 1 VIEW COMPARISON:  None Available. FINDINGS: Numerous dilated loops of bowel identified both small and large bowel. Small bowel loops are measured in diameter up to 7 cm. Question some fold thickening. Moderate right-sided colonic stool. Films are under penetrated on these portable supine radiographs. No obvious free air IMPRESSION: Dilated bowel seen diffusely including severe dilatation of small-bowel loops up to 7 cm. Right-sided stool. Areas of fold thickening. Differential includes developing obstruction or ileus. Follow up CT scan with IV contrast  may be useful to further delineate. Limited radiographs Electronically Signed   By: Karen Kays M.D.   On: 07/04/2023 14:31   DG Chest 2 View  Result Date: 07/04/2023 CLINICAL DATA:  Shortness of breath.  Postpartum. EXAM: CHEST - 2 VIEW COMPARISON:  03/31/2019 FINDINGS: Two views of the chest were obtained. Cardiac silhouette is enlarged. Patchy densities at both lung bases, right side greater than left. The right hemidiaphragm is obscured. Findings are suggestive for atelectasis and probably right pleural fluid. Central vascular structures are mildly prominent. Negative for a pneumothorax. Trachea is midline. IMPRESSION: 1.  Cardiomegaly with mild vascular congestion. 2. Bibasilar densities, right side greater than left. Findings are suggestive for atelectasis and right pleural fluid. Electronically Signed   By: Richarda Overlie M.D.   On: 07/04/2023 09:16    ROS - all of the below systems have been reviewed with the patient and positives are indicated with bold text General: chills, fever or night sweats Eyes: blurry vision or double vision ENT: epistaxis or sore throat Hematologic/Lymphatic: bleeding problems, blood clots  Endocrine: temperature intolerance or unexpected weight changes Resp: cough, shortness of breath, or wheezing CV: chest pain or dyspnea on exertion GI: as per HPI GU: dysuria, trouble voiding, or hematuria  PE Blood pressure (!) 151/78, pulse (!) 124, temperature 99.2 F (37.3 C), temperature source Oral, resp. rate (!) 24, height 5\' 4"  (1.626 m), weight 115.3 kg, last menstrual period 10/20/2022, SpO2 95%, unknown if currently breastfeeding. Constitutional: NAD; conversant Eyes: Moist conjunctiva Lungs: Normal respiratory effort CV: tachycardic rate, reg rhythm GI: Abd obese, distended, not significantly tender per se; no rebound nor guarding. Pfannenstiel dressed with honeycomb and this is dry. There is no clearly palpable masses underneath but exam severely limited by habitus MSK: Normal range of motion of extremities Psychiatric: Appropriate affect  Her primary RN served as Biomedical engineer for this examination/entire encounter as well  Results for orders placed or performed during the hospital encounter of 06/29/23 (from the past 48 hour(s))  Glucose, capillary     Status: Abnormal   Collection Time: 07/02/23  8:59 PM  Result Value Ref Range   Glucose-Capillary 157 (H) 70 - 99 mg/dL    Comment: Glucose reference range applies only to samples taken after fasting for at least 8 hours.  Glucose, capillary     Status: Abnormal   Collection Time: 07/02/23 10:47 PM  Result Value Ref Range    Glucose-Capillary 165 (H) 70 - 99 mg/dL    Comment: Glucose reference range applies only to samples taken after fasting for at least 8 hours.  Culture, OB Urine     Status: Abnormal   Collection Time: 07/03/23  5:57 AM   Specimen: Urine, Random  Result Value Ref Range   Specimen Description URINE, RANDOM    Special Requests NONE    Culture (A)     MULTIPLE SPECIES PRESENT, SUGGEST RECOLLECTION NO GROUP B STREP (S.AGALACTIAE) ISOLATED Performed at [facility], [address], [place], [place] [address]    Report Status 07/04/2023 FINAL   CBC with Differential/Platelet     Status: Abnormal   Collection Time: 07/03/23  6:10 AM  Result Value Ref Range   WBC 14.2 (H) 4.0 - 10.5 K/uL   RBC 4.25 3.87 - 5.11 MIL/uL   Hemoglobin 10.4 (L) 12.0 - 15.0 g/dL   HCT 19.1 (L) 47.8 - 29.5 %   MCV 77.9 (L) 80.0 - 100.0 fL   MCH 24.5 (L) 26.0 - 34.0  pg   MCHC 31.4 30.0 - 36.0 g/dL   RDW 16.1 (H) 09.6 - 04.5 %   Platelets 209 150 - 400 K/uL    Comment: REPEATED TO VERIFY   nRBC 0.4 (H) 0.0 - 0.2 %   Neutrophils Relative % 88 %   Neutro Abs 12.6 (H) 1.7 - 7.7 K/uL   Lymphocytes Relative 4 %   Lymphs Abs 0.6 (L) 0.7 - 4.0 K/uL   Monocytes Relative 7 %   Monocytes Absolute 0.9 0.1 - 1.0 K/uL   Eosinophils Relative 0 %   Eosinophils Absolute 0.0 0.0 - 0.5 K/uL   Basophils Relative 0 %   Basophils Absolute 0.0 0.0 - 0.1 K/uL   Immature Granulocytes 1 %   Abs Immature Granulocytes 0.10 (H) 0.00 - 0.07 K/uL    Comment: Performed at [facility], [address], [place], [place] [address]  Glucose, capillary     Status: Abnormal   Collection Time: 07/03/23  9:46 AM  Result Value Ref Range   Glucose-Capillary 140 (H) 70 - 99 mg/dL    Comment: Glucose reference range applies only to samples taken after fasting for at least 8 hours.  Glucose, capillary     Status: Abnormal   Collection Time: 07/03/23  4:43 PM  Result Value Ref Range   Glucose-Capillary 123 (H) 70 - 99 mg/dL     Comment: Glucose reference range applies only to samples taken after fasting for at least 8 hours.  Glucose, capillary     Status: Abnormal   Collection Time: 07/03/23 10:27 PM  Result Value Ref Range   Glucose-Capillary 122 (H) 70 - 99 mg/dL    Comment: Glucose reference range applies only to samples taken after fasting for at least 8 hours.  Respiratory (~20 pathogens) panel by PCR     Status: Abnormal   Collection Time: 07/04/23  4:30 AM   Specimen: Nasopharyngeal Swab; Respiratory  Result Value Ref Range   Adenovirus NOT DETECTED NOT DETECTED   Coronavirus 229E NOT DETECTED NOT DETECTED    Comment: (NOTE) The Coronavirus on the Respiratory Panel, DOES NOT test for the novel  Coronavirus (2019 nCoV)    Coronavirus HKU1 NOT DETECTED NOT DETECTED   Coronavirus NL63 NOT DETECTED NOT DETECTED   Coronavirus OC43 NOT DETECTED NOT DETECTED   Metapneumovirus NOT DETECTED NOT DETECTED   Rhinovirus / Enterovirus DETECTED (A) NOT DETECTED   Influenza A NOT DETECTED NOT DETECTED   Influenza B NOT DETECTED NOT DETECTED   Parainfluenza Virus 1 NOT DETECTED NOT DETECTED   Parainfluenza Virus 2 NOT DETECTED NOT DETECTED   Parainfluenza Virus 3 NOT DETECTED NOT DETECTED   Parainfluenza Virus 4 NOT DETECTED NOT DETECTED   Respiratory Syncytial Virus NOT DETECTED NOT DETECTED   Bordetella pertussis NOT DETECTED NOT DETECTED   Bordetella Parapertussis NOT DETECTED NOT DETECTED   Chlamydophila pneumoniae NOT DETECTED NOT DETECTED   Mycoplasma pneumoniae NOT DETECTED NOT DETECTED    Comment: Performed at [facility], [address] [place], [place] [address]  Comprehensive metabolic panel     Status: Abnormal   Collection Time: 07/04/23 11:01 AM  Result Value Ref Range   Sodium 139 135 - 145 mmol/L   Potassium 3.3 (L) 3.5 - 5.1 mmol/L   Chloride 99 98 - 111 mmol/L   CO2 26 22 - 32 mmol/L   Glucose, Bld 126 (H) 70 - 99 mg/dL    Comment: Glucose reference range applies only to samples  taken after fasting for at least 8 hours.   BUN 17 6 - 20 mg/dL   Creatinine, Ser 4.09 (H) 0.44 - 1.00 mg/dL   Calcium 8.1 (L) 8.9 - 10.3 mg/dL   Total Protein 5.3 (L) 6.5 - 8.1 g/dL   Albumin 1.9 (L) 3.5 - 5.0 g/dL   AST 21 15 - 41 U/L   ALT 15 0 - 44 U/L   Alkaline Phosphatase 69 38 - 126 U/L   Total Bilirubin 0.8 0.3 - 1.2 mg/dL   GFR, Estimated >81 >19 mL/min    Comment: (NOTE) Calculated using the CKD-EPI Creatinine Equation (2021)    Anion gap 14 5 - 15    Comment: Performed at [facility], [address] [place], [place] [address]  Hemoglobin A1c     Status: None   Collection Time: 07/04/23 12:17 PM  Result Value Ref Range   Hgb A1c MFr Bld 5.4 4.8 - 5.6 %    Comment: (NOTE) Pre diabetes:          5.7%-6.4%  Diabetes:              >6.4%  Glycemic control for   <7.0% adults with diabetes    Mean Plasma Glucose 108.28 mg/dL    Comment: Performed at [facility], [address] [place], [place] [address]  Glucose, capillary     Status: Abnormal   Collection Time: 07/04/23 12:58 PM  Result Value Ref Range   Glucose-Capillary 149 (H) 70 - 99 mg/dL    Comment: Glucose reference range applies only to samples taken after fasting for at least 8 hours.  Glucose, capillary     Status: Abnormal   Collection Time: 07/04/23  1:52 PM  Result Value Ref Range   Glucose-Capillary 134 (H) 70 - 99 mg/dL    Comment: Glucose reference range applies only to samples taken after fasting for at least 8 hours.  Glucose, capillary     Status: Abnormal   Collection Time: 07/04/23  2:52 PM  Result Value Ref Range   Glucose-Capillary 147 (H) 70 - 99 mg/dL    Comment: Glucose reference range applies only to samples taken after fasting for at least 8 hours.  Glucose, capillary     Status: Abnormal   Collection Time: 07/04/23  4:04 PM  Result Value Ref Range   Glucose-Capillary 137 (H) 70 - 99 mg/dL    Comment: Glucose reference range applies only to samples taken after fasting  for at least 8 hours.    DG Abd 1 View  Result Date: 07/04/2023 CLINICAL DATA:  Vomiting.  Pain EXAM: ABDOMEN - 1 VIEW COMPARISON:  None Available. FINDINGS: Numerous dilated loops of bowel identified both small and large bowel. Small bowel loops are measured in diameter up to 7 cm. Question some fold thickening. Moderate right-sided colonic stool. Films are under penetrated on these portable supine radiographs. No obvious free air IMPRESSION: Dilated bowel seen diffusely including severe dilatation of small-bowel loops up to 7 cm. Right-sided stool. Areas of fold thickening. Differential includes developing obstruction or ileus. Follow up CT scan with IV contrast may be useful to further delineate. Limited radiographs Electronically Signed   By: Karen Kays M.D.   On: 07/04/2023 14:31   DG Chest 2 View  Result Date: 07/04/2023 CLINICAL DATA:  Shortness of breath.  Postpartum. EXAM: CHEST - 2 VIEW COMPARISON:  03/31/2019 FINDINGS: Two views of the chest were obtained. Cardiac silhouette is enlarged. Patchy densities at  both lung bases, right side greater than left. The right hemidiaphragm is obscured. Findings are suggestive for atelectasis and probably right pleural fluid. Central vascular structures are mildly prominent. Negative for a pneumothorax. Trachea is midline. IMPRESSION: 1. Cardiomegaly with mild vascular congestion. 2. Bibasilar densities, right side greater than left. Findings are suggestive for atelectasis and right pleural fluid. Electronically Signed   By: Richarda Overlie M.D.   On: 07/04/2023 09:16      A/P: Erika Lewis is an 37 y.o. female with HTN, DM, asthma, sickle cell trait currently with rhinovirus and dilated small bowel on XR with n/v  -We have recommended stat CT A/P with contrast to further evaluate -NPO, NGT to Low intermittent wall suction -Discussed with Dr. Normand Sloop and pt our role and concern for possible fascial dehisence based on symptoms and presentation - if  this is confirmed by CT, we discussed recommendation for return to OR for washout/fascial closure and if concerns or bowel threatened in appearance or injured possibility of bowel resection which we would certainly be available for as well.  I will update my partner whom is on call tonight as well.  All of the above discussed with patient, her family and Dr. Normand Sloop whom was present at bedside as well.  I spent a total of 65 minutes in both face-to-face and non-face-to-face activities, excluding procedures performed, for this visit on the date of this encounter.  Marin Olp, MD [facility], [facility]

## 2023-07-04 NOTE — Progress Notes (Signed)
Subjective: Postpartum Day 4: Cesarean Delivery Patient reports nausea and vommitig.  Not  tolerating PO, + flatus, + BM and no problems voiding.    Objective: Vital signs in last 24 hours: Temp:  [99.1 F (37.3 C)-100.1 F (37.8 C)] 99.2 F (37.3 C) (10/03 1600) Pulse Rate:  [107-135] 124 (10/03 1600) Resp:  [18-24] 24 (10/03 1600) BP: (133-151)/(74-80) 151/78 (10/03 1600) SpO2:  [86 %-100 %] 91 % (10/03 1600)  Physical Exam:  General: alert Lochia: appropriate Uterine Fundus: firm Incision: healing well, no significant erythema Abdomen. Distended with bowel sounds.  NT  DVT Evaluation: No evidence of DVT seen on physical exam.  Recent Labs    07/03/23 0610  HGB 10.4*  HCT 33.1*  CBC    Component Value Date/Time   WBC 14.2 (H) 07/03/2023 0610   RBC 4.25 07/03/2023 0610   HGB 10.4 (L) 07/03/2023 0610   HCT 33.1 (L) 07/03/2023 0610   PLT 209 07/03/2023 0610   MCV 77.9 (L) 07/03/2023 0610   MCH 24.5 (L) 07/03/2023 0610   MCHC 31.4 07/03/2023 0610   RDW 29.7 (H) 07/03/2023 0610   LYMPHSABS 0.6 (L) 07/03/2023 0610   MONOABS 0.9 07/03/2023 0610   EOSABS 0.0 07/03/2023 0610   BASOSABS 0.0 07/03/2023 0610       Latest Ref Rng & Units 07/04/2023   11:01 AM 07/02/2023    5:07 AM 07/01/2023    8:13 AM  CMP  Glucose 70 - 99 mg/dL 161  096  92   BUN 6 - 20 mg/dL 17     Creatinine 0.45 - 1.00 mg/dL 4.09     Sodium 811 - 914 mmol/L 139     Potassium 3.5 - 5.1 mmol/L 3.3     Chloride 98 - 111 mmol/L 99     CO2 22 - 32 mmol/L 26     Calcium 8.9 - 10.3 mg/dL 8.1     Total Protein 6.5 - 8.1 g/dL 5.3     Total Bilirubin 0.3 - 1.2 mg/dL 0.8     Alkaline Phos 38 - 126 U/L 69     AST 15 - 41 U/L 21     ALT 0 - 44 U/L 15         Assessment/Plan: Status post Cesarean section complicated by N/v and illeus.  Tried to correct with reglan and NPO.  Pt still with n/v. General surgery consulted.  Recommend CT now and will intervene sooner if needed.   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  Would recommend insulin if BS over 200 CHTN blood pressure controlled on procardia Creatinine increased plan to hydrate and monitor closely.  Recheck cmp in am     Lorilee Cafarella A Hayleigh Bawa 07/04/2023, 4:19 PM

## 2023-07-04 NOTE — Plan of Care (Signed)
  Problem: Education: Goal: Knowledge of General Education information will improve Description: Including pain rating scale, medication(s)/side effects and non-pharmacologic comfort measures Outcome: Progressing   Problem: Health Behavior/Discharge Planning: Goal: Ability to manage health-related needs will improve Outcome: Progressing   Problem: Clinical Measurements: Goal: Ability to maintain clinical measurements within normal limits will improve Outcome: Progressing Goal: Will remain free from infection Outcome: Progressing Goal: Diagnostic test results will improve Outcome: Progressing Goal: Respiratory complications will improve Outcome: Progressing Goal: Cardiovascular complication will be avoided Outcome: Progressing   Problem: Elimination: Goal: Will not experience complications related to bowel motility Outcome: Progressing Goal: Will not experience complications related to urinary retention Outcome: Progressing   Problem: Pain Managment: Goal: General experience of comfort will improve Outcome: Progressing   Problem: Skin Integrity: Goal: Risk for impaired skin integrity will decrease Outcome: Progressing   Problem: Education: Goal: Knowledge of the prescribed therapeutic regimen will improve Outcome: Progressing Goal: Understanding of sexual limitations or changes related to disease process or condition will improve Outcome: Progressing Goal: Individualized Educational Video(s) Outcome: Progressing   Problem: Education: Goal: Knowledge of condition will improve Outcome: Progressing   Problem: Coping: Goal: Ability to adjust to condition or change in health will improve Outcome: Progressing   Problem: Fluid Volume: Goal: Ability to maintain a balanced intake and output will improve Outcome: Progressing   Problem: Health Behavior/Discharge Planning: Goal: Ability to identify and utilize available resources and services will improve Outcome:  Progressing Goal: Ability to manage health-related needs will improve Outcome: Progressing   Problem: Metabolic: Goal: Ability to maintain appropriate glucose levels will improve Outcome: Progressing   Problem: Nutritional: Goal: Maintenance of adequate nutrition will improve Outcome: Progressing Goal: Progress toward achieving an optimal weight will improve Outcome: Progressing   Problem: Skin Integrity: Goal: Risk for impaired skin integrity will decrease Outcome: Progressing   Problem: Tissue Perfusion: Goal: Adequacy of tissue perfusion will improve Outcome: Progressing

## 2023-07-04 NOTE — Progress Notes (Addendum)
This note covers timeframe between approximately 8:30pm to approximately 11:50pm.  In to room per the request of patient's family (aunt and mother). Upon arrival to room, patient's mother and aunt are in the hallway. They have many questions regarding the patient's care. Patient was placed on sepsis protocol due to persistent tachycardia (HR 130s), fever (Tlast: 100.7 on 10/3 at 1856). Cefepime/Flagyl ordered. We discussed that the patient's current presentation may potentially be multifactorial (combination of Rhinovirus, relative dehydration, sequelae after PPH, possible ileus vs bowel obstruction secondary to possible fascial dehiscence). I counseled the family that given tachycardia and fever that I recommend the sepsis protocol out of an abundance of caution while awaiting further work-up at this time.   Patient was seen at bedside. NGT in place with bilious fluid. Patient does not appear in distress. Abdomen is distended but is appropriately tender throughout, no rebound/guarding, honeycomb dressing is in place. SCDs on and working bilaterally.  Plan of care was reviewed with the patient and her family in full. Recommend CT A/P as the next step in determining the next course of action in terms of treatment.      Latest Ref Rng & Units 07/04/2023    8:20 PM 07/03/2023    6:10 AM 06/30/2023    5:05 AM  CBC  WBC 4.0 - 10.5 K/uL 10.2  14.2  11.2   Hemoglobin 12.0 - 15.0 g/dL 9.1  16.1  7.9   Hematocrit 36.0 - 46.0 % 28.8  33.1  25.4   Platelets 150 - 400 K/uL 340  209  127       Latest Ref Rng & Units 07/04/2023    8:20 PM 07/04/2023   11:01 AM 07/02/2023    5:07 AM  CMP  Glucose 70 - 99 mg/dL 096  045  409   BUN 6 - 20 mg/dL 18  17    Creatinine 8.11 - 1.00 mg/dL 9.14  7.82    Sodium 956 - 145 mmol/L 142  139    Potassium 3.5 - 5.1 mmol/L 3.1  3.3    Chloride 98 - 111 mmol/L 100  99    CO2 22 - 32 mmol/L 24  26    Calcium 8.9 - 10.3 mg/dL 8.2  8.1    Total Protein 6.5 - 8.1 g/dL 5.6   5.3    Total Bilirubin 0.3 - 1.2 mg/dL 0.9  0.8    Alkaline Phos 38 - 126 U/L 73  69    AST 15 - 41 U/L 12  21    ALT 0 - 44 U/L 17  15     Lactic Acid, Venous    Component Value Date/Time   LATICACIDVEN 1.9 07/04/2023 2020    Call received from primary RN that patient was unable to tolerate PO contrast and vomited around the NGT. Call made to Dr. Violeta Gelinas with general surgery who stated that PO contrast would not be necessary to recognize fascial dehisence on CT A/P. CT Dept called and order changed. CT A/P performed with IV contrast as patient was unable to tolerate contrast through NGT. CT A/P results as below. Spoke with Dr. Jake Samples regarding the findings as documented - confirmed that there is no fascial dehiscence. Called Dr. Violeta Gelinas (general surgery) regarding the CT A/P findings who suspects clinical picture is most consistent with ileus given the bilious output. He recommends continued NGT for bowel decompression and general surgery will continue to follow. Patient had 900cc of bilious fluid from the NGT  upon my second encounter in the room. Due to findings of bilateral lower lobe consolidations, these findings are suspicious for pneumonia - unclear if etiology is secondary to Rhinovirus infection in conjunction with atelectasis secondary to recent cesarean section vs aspiration pneumonia. Given the patient's multiple complicated medical concerns (tachycardia, fever, pneumonia, low O2 saturations, AKI) - IM Hospitalist was consulted to assist in managing these medical problems. Hospitalist will see the patient this evening. All of the aforementioned findings and consult discussions were reviewed with the patient and her family in full.  Patient has not had any urine output documented and has not voided in multiple hours. I counseled the patient and her family that I recommend foley catheter for strict I&O monitoring. Continue maintenance IVFs. Antibiotic per discretion of  Hospitalist. Hydralazine protocol ordered due to severe range blood pressures (160s/80s) and Procardia XL to increase to 60mg  daily starting tomorrow. I discussed with the family that the patient does not need surgical intervention at this time and they are grateful. All questions were invited and answered fully to the patient and her family's satisfaction.  Narrative & Impression  CLINICAL DATA:  Postop C-section with bowel dilatation   EXAM: CT ABDOMEN AND PELVIS WITH CONTRAST   TECHNIQUE: Multidetector CT imaging of the abdomen and pelvis was performed using the standard protocol following bolus administration of intravenous contrast.   RADIATION DOSE REDUCTION: This exam was performed according to the departmental dose-optimization program which includes automated exposure control, adjustment of the mA and/or kV according to patient size and/or use of iterative reconstruction technique.   CONTRAST:  65mL OMNIPAQUE IOHEXOL 350 MG/ML SOLN   COMPARISON:  Radiograph 07/04/2023   FINDINGS: Lower chest: Lung bases demonstrate bilateral lower lobe consolidations. No significant pleural effusion.   Hepatobiliary: No focal liver abnormality is seen. No gallstones, gallbladder wall thickening, or biliary dilatation.   Pancreas: Unremarkable. No pancreatic ductal dilatation or surrounding inflammatory changes.   Spleen: Normal in size without focal abnormality.   Adrenals/Urinary Tract: Adrenal glands are within normal limits. The kidneys show no hydronephrosis. The bladder is normal   Stomach/Bowel: Esophageal tube tip in the body of stomach. Marked fluid distension of the stomach. Multiple loops of fluid-filled dilated small bowel measuring up to 6 cm. Peripheral eyes gas collections within the jejunum, series 3, image 50 with areas of non dependent gas, raising some concern for pneumatosis. No bowel wall thickening. Terminal ileum and distal small bowel are decompressed with  hooked configuration of the central small bowel on series 3 image 43 through 59. right and transverse colon are moderately distended with fluid and air, the left colon is fluid-filled and relatively decompressed to the rectum.   Vascular/Lymphatic: Nonaneurysmal aorta.  No suspicious lymph nodes.   Reproductive: Enlarged uterus consistent with recent postpartum status. Enhancing mass along the posterior uterine corpus which may represent a fibroid.   Other: No discrete free air is seen. Small volume free fluid in the upper abdomen with moderate fluid in the right greater than left lower quadrants. Low ventral incision with gas along the low anterior abdominal wall corresponding to history of C-section. No herniation of uterus or bowel contents at this location.   Musculoskeletal: No acute or suspicious osseous abnormality.   IMPRESSION: 1. Enlarged uterus consistent with recent postpartum status. Gas along the low anterior abdominal wall presumably related to history of C-section, no herniation of uterus or bowel in the region of C-section incision. 2. Marked fluid distension of stomach. Multiple loops of fluid  and air distended small bowel with additional finding of moderate fluid and air distension of proximal and transverse colon. Though much of the findings could be related to postoperative ileus, there is definite appearance of caliber change of the small bowel centrally within the abdomen with abnormal angular configuration of this central small bowel, raising concern for bowel obstruction. Furthermore there is abnormal appearance of proximal small bowel/jejunal loops which demonstrate non dependent peripheral gas, raising concern for pneumatosis. No free gas or portal venous gas. 3. Extensive bilateral lower lobe consolidation with mild consolidation at the lingula. Findings could be due to pneumonia or possible aspiration.   Critical Value/emergent results were called by  telephone at the time of interpretation on 07/04/2023 at 10:49 pm to provider Ramie Palladino , who verbally acknowledged these results.     Electronically Signed   By: Jasmine Pang M.D.   On: 07/04/2023 22:49    Steva Ready, DO

## 2023-07-05 ENCOUNTER — Other Ambulatory Visit (HOSPITAL_COMMUNITY): Payer: Medicaid Other

## 2023-07-05 ENCOUNTER — Inpatient Hospital Stay (HOSPITAL_COMMUNITY): Payer: Medicaid Other

## 2023-07-05 DIAGNOSIS — K567 Ileus, unspecified: Secondary | ICD-10-CM | POA: Diagnosis not present

## 2023-07-05 DIAGNOSIS — E119 Type 2 diabetes mellitus without complications: Secondary | ICD-10-CM | POA: Diagnosis not present

## 2023-07-05 DIAGNOSIS — Z794 Long term (current) use of insulin: Secondary | ICD-10-CM

## 2023-07-05 DIAGNOSIS — J121 Respiratory syncytial virus pneumonia: Secondary | ICD-10-CM

## 2023-07-05 LAB — GLUCOSE, CAPILLARY
Glucose-Capillary: 155 mg/dL — ABNORMAL HIGH (ref 70–99)
Glucose-Capillary: 123 mg/dL — ABNORMAL HIGH (ref 70–99)
Glucose-Capillary: 108 mg/dL — ABNORMAL HIGH (ref 70–99)
Glucose-Capillary: 109 mg/dL — ABNORMAL HIGH (ref 70–99)
Glucose-Capillary: 128 mg/dL — ABNORMAL HIGH (ref 70–99)

## 2023-07-05 LAB — COMPREHENSIVE METABOLIC PANEL
ALT: 15 U/L (ref 0–44)
AST: 12 U/L — ABNORMAL LOW (ref 15–41)
Albumin: 1.7 g/dL — ABNORMAL LOW (ref 3.5–5.0)
Alkaline Phosphatase: 58 U/L (ref 38–126)
Anion gap: 18 — ABNORMAL HIGH (ref 5–15)
BUN: 20 mg/dL (ref 6–20)
CO2: 25 mmol/L (ref 22–32)
Calcium: 8.1 mg/dL — ABNORMAL LOW (ref 8.9–10.3)
Chloride: 101 mmol/L (ref 98–111)
Creatinine, Ser: 1.12 mg/dL — ABNORMAL HIGH (ref 0.44–1.00)
GFR, Estimated: >60 mL/min (ref >60)
Glucose, Bld: 149 mg/dL — ABNORMAL HIGH (ref 70–99)
Potassium: 3 mmol/L — ABNORMAL LOW (ref 3.5–5.1)
Sodium: 144 mmol/L (ref 135–145)
Total Bilirubin: 0.1 mg/dL — ABNORMAL LOW (ref 0.3–1.2)
Total Protein: 5 g/dL — ABNORMAL LOW (ref 6.5–8.1)

## 2023-07-05 LAB — CBC WITH DIFFERENTIAL/PLATELET
Abs Immature Granulocytes: 0 10*3/uL (ref 0.00–0.07)
Basophils Absolute: 0 10*3/uL (ref 0.0–0.1)
Basophils Relative: 0 %
Eosinophils Absolute: 0 10*3/uL (ref 0.0–0.5)
Eosinophils Relative: 0 %
HCT: 29.9 % — ABNORMAL LOW (ref 36.0–46.0)
Hemoglobin: 9.4 g/dL — ABNORMAL LOW (ref 12.0–15.0)
Lymphocytes Relative: 7 %
Lymphs Abs: 0.6 10*3/uL — ABNORMAL LOW (ref 0.7–4.0)
MCH: 24.3 pg — ABNORMAL LOW (ref 26.0–34.0)
MCHC: 31.4 g/dL (ref 30.0–36.0)
MCV: 77.3 fL — ABNORMAL LOW (ref 80.0–100.0)
Monocytes Absolute: 0.9 10*3/uL (ref 0.1–1.0)
Monocytes Relative: 11 %
Neutro Abs: 6.6 10*3/uL (ref 1.7–7.7)
Neutrophils Relative %: 82 %
Platelets: 266 10*3/uL (ref 150–400)
RBC: 3.87 MIL/uL (ref 3.87–5.11)
RDW: 28.7 % — ABNORMAL HIGH (ref 11.5–15.5)
WBC: 8.1 10*3/uL (ref 4.0–10.5)
nRBC: 0.2 % (ref 0.0–0.2)
nRBC: 1 /100{WBCs} — ABNORMAL HIGH

## 2023-07-05 LAB — BRAIN NATRIURETIC PEPTIDE: B Natriuretic Peptide: 31.3 pg/mL (ref 0.0–100.0)

## 2023-07-05 MED ORDER — METOPROLOL TARTRATE 5 MG/5ML IV SOLN
5.0000 mg | Freq: Once | INTRAVENOUS | Status: AC
  Administered 2023-07-05: 5 mg via INTRAVENOUS
  Filled 2023-07-05: qty 5

## 2023-07-05 MED ORDER — DIATRIZOATE MEGLUMINE & SODIUM 66-10 % PO SOLN
90.0000 mL | Freq: Once | ORAL | Status: AC
  Administered 2023-07-05: 90 mL via NASOGASTRIC
  Filled 2023-07-05 (×2): qty 90

## 2023-07-05 MED ORDER — PANTOPRAZOLE SODIUM 40 MG IV SOLR
40.0000 mg | Freq: Two times a day (BID) | INTRAVENOUS | Status: DC
  Administered 2023-07-05 – 2023-07-07 (×4): 40 mg via INTRAVENOUS
  Filled 2023-07-05 (×4): qty 10

## 2023-07-05 MED ORDER — INSULIN ASPART 100 UNIT/ML IJ SOLN
0.0000 [IU] | INTRAMUSCULAR | Status: DC
  Administered 2023-07-05: 2 [IU] via SUBCUTANEOUS
  Administered 2023-07-05: 3 [IU] via SUBCUTANEOUS
  Administered 2023-07-05 – 2023-07-06 (×4): 2 [IU] via SUBCUTANEOUS
  Administered 2023-07-07: 3 [IU] via SUBCUTANEOUS
  Administered 2023-07-07 – 2023-07-10 (×8): 2 [IU] via SUBCUTANEOUS

## 2023-07-05 MED ORDER — NIFEDIPINE ER OSMOTIC RELEASE 30 MG PO TB24
30.0000 mg | ORAL_TABLET | Freq: Every day | ORAL | Status: DC
  Administered 2023-07-08 – 2023-07-09 (×2): 30 mg via ORAL
  Filled 2023-07-05 (×3): qty 1

## 2023-07-05 MED ORDER — POTASSIUM CHLORIDE 10 MEQ/100ML IV SOLN
10.0000 meq | INTRAVENOUS | Status: AC
  Administered 2023-07-05 (×4): 10 meq via INTRAVENOUS
  Filled 2023-07-05 (×4): qty 100

## 2023-07-05 MED ORDER — POTASSIUM CHLORIDE 2 MEQ/ML IV SOLN
INTRAVENOUS | Status: DC
  Filled 2023-07-05 (×3): qty 1000

## 2023-07-05 MED ORDER — METRONIDAZOLE 500 MG/100ML IV SOLN
500.0000 mg | Freq: Two times a day (BID) | INTRAVENOUS | Status: DC
  Administered 2023-07-05 – 2023-07-07 (×5): 500 mg via INTRAVENOUS
  Filled 2023-07-05 (×5): qty 100

## 2023-07-05 NOTE — Progress Notes (Signed)
Patient ID: Erika Lewis, female   DOB: 08/06/1986, 37 y.o.   MRN: [id number] [facility] Surgery Progress Note  6 Days Post-Op  Subjective: CC-  Feeling a little better this morning. Abdominal distension is less. No more n/v since NG placement. No flatus or BM, but she feels things moving in her abdomen and thinks she may have a BM soon. She vomited about 800cc yesterday, and NG put out 750cc.  Objective: Vital signs in last 24 hours: Temp:  [98.5 F (36.9 C)-100.7 F (38.2 C)] 98.5 F (36.9 C) (10/04 0428) Pulse Rate:  [105-135] 105 (10/04 0428) Resp:  [20-28] 20 (10/04 0428) BP: (139-173)/(75-87) 139/80 (10/04 0428) SpO2:  [86 %-100 %] 97 % (10/04 0400)    Intake/Output from previous day: 10/03 0701 - 10/04 0700 In: 491.9 [I.V.:491.9] Out: 4101 [Urine:1650; Emesis/NG output:801] Intake/Output this shift: No intake/output data recorded.  PE: Gen:  Alert, NAD Card: mild tachy Pulm: rate and effort normal on room air Abd: distended but soft, hypoactive bowel sounds, minimal generalized TTP without rebound or guarding, honeycomb to Pfannenstiel cdi  Lab Results:  Recent Labs    07/04/23 2020 07/05/23 0203  WBC 10.2 8.1  HGB 9.1* 9.4*  HCT 28.8* 29.9*  PLT 340 266   BMET Recent Labs    07/04/23 2020 07/05/23 0203  NA 142 144  K 3.1* 3.0*  CL 100 101  CO2 24 25  GLUCOSE 144* 149*  BUN 18 20  CREATININE 1.14* 1.12*  CALCIUM 8.2* 8.1*   PT/INR Recent Labs    07/04/23 2020  LABPROT 15.7*  INR 1.2   CMP     Component Value Date/Time   NA 144 07/05/2023 0203   K 3.0 (L) 07/05/2023 0203   CL 101 07/05/2023 0203   CO2 25 07/05/2023 0203   GLUCOSE 149 (H) 07/05/2023 0203   BUN 20 07/05/2023 0203   CREATININE 1.12 (H) 07/05/2023 0203   CALCIUM 8.1 (L) 07/05/2023 0203   PROT 5.0 (L) 07/05/2023 0203   ALBUMIN 1.7 (L) 07/05/2023 0203   AST 12 (L) 07/05/2023 0203   ALT 15 07/05/2023 0203   ALKPHOS 58 07/05/2023 0203   BILITOT 0.1 (L) 07/05/2023  0203   GFRNONAA >60 07/05/2023 0203   GFRAA >60 03/31/2019 0210   Lipase     Component Value Date/Time   LIPASE 27 09/09/2017 1803       Studies/Results: CT ABDOMEN PELVIS W CONTRAST  Result Date: 07/04/2023 CLINICAL DATA:  Postop C-section with bowel dilatation EXAM: CT ABDOMEN AND PELVIS WITH CONTRAST TECHNIQUE: Multidetector CT imaging of the abdomen and pelvis was performed using the standard protocol following bolus administration of intravenous contrast. RADIATION DOSE REDUCTION: This exam was performed according to the departmental dose-optimization program which includes automated exposure control, adjustment of the mA and/or kV according to patient size and/or use of iterative reconstruction technique. CONTRAST:  65mL OMNIPAQUE IOHEXOL 350 MG/ML SOLN COMPARISON:  Radiograph 07/04/2023 FINDINGS: Lower chest: Lung bases demonstrate bilateral lower lobe consolidations. No significant pleural effusion. Hepatobiliary: No focal liver abnormality is seen. No gallstones, gallbladder wall thickening, or biliary dilatation. Pancreas: Unremarkable. No pancreatic ductal dilatation or surrounding inflammatory changes. Spleen: Normal in size without focal abnormality. Adrenals/Urinary Tract: Adrenal glands are within normal limits. The kidneys show no hydronephrosis. The bladder is normal Stomach/Bowel: Esophageal tube tip in the body of stomach. Marked fluid distension of the stomach. Multiple loops of fluid-filled dilated small bowel measuring up to 6 cm. Peripheral eyes gas collections within  the jejunum, series 3, image 50 with areas of non dependent gas, raising some concern for pneumatosis. No bowel wall thickening. Terminal ileum and distal small bowel are decompressed with hooked configuration of the central small bowel on series 3 image 43 through 59. right and transverse colon are moderately distended with fluid and air, the left colon is fluid-filled and relatively decompressed to the rectum.  Vascular/Lymphatic: Nonaneurysmal aorta.  No suspicious lymph nodes. Reproductive: Enlarged uterus consistent with recent postpartum status. Enhancing mass along the posterior uterine corpus which may represent a fibroid. Other: No discrete free air is seen. Small volume free fluid in the upper abdomen with moderate fluid in the right greater than left lower quadrants. Low ventral incision with gas along the low anterior abdominal wall corresponding to history of C-section. No herniation of uterus or bowel contents at this location. Musculoskeletal: No acute or suspicious osseous abnormality. IMPRESSION: 1. Enlarged uterus consistent with recent postpartum status. Gas along the low anterior abdominal wall presumably related to history of C-section, no herniation of uterus or bowel in the region of C-section incision. 2. Marked fluid distension of stomach. Multiple loops of fluid and air distended small bowel with additional finding of moderate fluid and air distension of proximal and transverse colon. Though much of the findings could be related to postoperative ileus, there is definite appearance of caliber change of the small bowel centrally within the abdomen with abnormal angular configuration of this central small bowel, raising concern for bowel obstruction. Furthermore there is abnormal appearance of proximal small bowel/jejunal loops which demonstrate non dependent peripheral gas, raising concern for pneumatosis. No free gas or portal venous gas. 3. Extensive bilateral lower lobe consolidation with mild consolidation at the lingula. Findings could be due to pneumonia or possible aspiration. Critical Value/emergent results were called by telephone at the time of interpretation on 07/04/2023 at 10:49 pm to provider MELISSA DAVIES , who verbally acknowledged these results. Electronically Signed   By: Jasmine Pang M.D.   On: 07/04/2023 22:49   DG Abd 1 View  Result Date: 07/04/2023 CLINICAL DATA:  Enteric  tube placement EXAM: ABDOMEN - 1 VIEW COMPARISON:  Abdominal radiograph dated 07/04/2023 FINDINGS: Gastric/enteric tube tip projects over the medial left hemiabdomen, likely in the stomach. Partially imaged gaseous dilation of bowel loops. Imaged lung bases demonstrate patchy and hazy opacities. IMPRESSION: Gastric/enteric tube tip projects over the medial left hemiabdomen, likely in the stomach. Electronically Signed   By: Agustin Cree M.D.   On: 07/04/2023 18:50   DG Abd 1 View  Result Date: 07/04/2023 CLINICAL DATA:  Vomiting.  Pain EXAM: ABDOMEN - 1 VIEW COMPARISON:  None Available. FINDINGS: Numerous dilated loops of bowel identified both small and large bowel. Small bowel loops are measured in diameter up to 7 cm. Question some fold thickening. Moderate right-sided colonic stool. Films are under penetrated on these portable supine radiographs. No obvious free air IMPRESSION: Dilated bowel seen diffusely including severe dilatation of small-bowel loops up to 7 cm. Right-sided stool. Areas of fold thickening. Differential includes developing obstruction or ileus. Follow up CT scan with IV contrast may be useful to further delineate. Limited radiographs Electronically Signed   By: Karen Kays M.D.   On: 07/04/2023 14:31   DG Chest 2 View  Result Date: 07/04/2023 CLINICAL DATA:  Shortness of breath.  Postpartum. EXAM: CHEST - 2 VIEW COMPARISON:  03/31/2019 FINDINGS: Two views of the chest were obtained. Cardiac silhouette is enlarged. Patchy densities at both lung bases,  right side greater than left. The right hemidiaphragm is obscured. Findings are suggestive for atelectasis and probably right pleural fluid. Central vascular structures are mildly prominent. Negative for a pneumothorax. Trachea is midline. IMPRESSION: 1. Cardiomegaly with mild vascular congestion. 2. Bibasilar densities, right side greater than left. Findings are suggestive for atelectasis and right pleural fluid. Electronically Signed   By:  Richarda Overlie M.D.   On: 07/04/2023 09:16    Anti-infectives: Anti-infectives (From admission, onward)    Start     Dose/Rate Route Frequency Ordered Stop   07/04/23 2100  metroNIDAZOLE (FLAGYL) IVPB 500 mg  Status:  Discontinued        500 mg 100 mL/hr over 60 Minutes Intravenous Every 8 hours 07/04/23 1923 07/05/23 0125   07/04/23 2000  ceFEPIme (MAXIPIME) 2 g in sodium chloride 0.9 % 100 mL IVPB        2 g 200 mL/hr over 30 Minutes Intravenous Every 8 hours 07/04/23 1923     07/03/23 0917  Ampicillin-Sulbactam (UNASYN) 3 g in sodium chloride 0.9 % 100 mL IVPB  Status:  Discontinued        3 g 200 mL/hr over 30 Minutes Intravenous Every 6 hours 07/03/23 0917 07/04/23 0943   06/29/23 0909  azithromycin (ZITHROMAX) 500 mg in sodium chloride 0.9 % 250 mL IVPB  Status:  Discontinued        500 mg 250 mL/hr over 60 Minutes Intravenous 60 min pre-op 06/29/23 0909 06/29/23 1547   06/29/23 0909  ceFAZolin (ANCEF) IVPB 2g/100 mL premix        2 g 200 mL/hr over 30 Minutes Intravenous 30 min pre-op 06/29/23 0909 06/29/23 1135        Assessment/Plan S/p C section 06/29/23 Ileus vs SBO - No indication for acute surgical intervention. She is distended but otherwise abdominal exam is fairly benign. WBC and lactic acid WNL. Continue NPO/NGT to LIWS. Will order gastrograffin per NG for SBO protocol today. Mobilize. K replaced, check Mag level.  ID - maxipime FEN - IVF, NPO/NGT to LIWS.  VTE - SCDs, ok for chemical dvt ppx from surgical standpoint Foley - in place  Rhinovirus, possible aspiration pneumonia HTN DM Asthma Sickle cell trait  I reviewed last 24 h vitals and pain scores, last 48 h intake and output, last 24 h labs and trends, and last 24 h imaging results.    LOS: 6 days    Franne Forts, [facility] 07/05/2023, 8:03 AM Please see Amion for pager number during day hours 7:00am-4:30pm,

## 2023-07-05 NOTE — Consult Note (Addendum)
2 PCP:   Nigel Bridgeman, CNM   Chief Complaint:  Small bowel obstruction  HPI: This is a 37 year old female with past medical history of T2DM, HTN, asthma.  Patient admitted and underwent a C-section and salpingectomy 9/28.  Postoperatively, patient had postpartum hemorrhage.  She was transfused and IV iron given.  Additionally, she developed abdominal distention, flatus, discomfort, nausea and vomiting.  Patient initially given p.o. simethicone, then transition to scheduled IV Reglan and Protonix.  CXR showed cardiomegaly with mild vascular congestion.  She was given IV Lasix 40 mg x 1.  CT abdomen/pelvis done showed marked fluid distention of the stomach plus moderate fluid-filled and air distended loops of bowel.  Extensive B/L lower lobe consolidation ?pneumonia vs possible aspiration pneumonia.  Blood cultures x 2 collected.  Patient started on IV Unasyn, then transition to cefepime and Flagyl as outlined in sepsis orderset.  This was done after patient developed a low-grade temperature Tmax 100.7 with MEWs 5.  Lactic acid x 3 normal.  Surgery consult was placed.  Treatment for ileus initiated.  NG tube LWIS placed.  Immediately 900 cc of dark green fluid return.  So far total of 1.2 L return in 90 minutes.  Patient incidentally diagnosed with RSV from a respiratory panel.   Patient's numbers improving.  WBC 14.2 decreased to 10.2 after antibiotics initiated.  Creatinine 1.2 decreased to 1.14 with IVF hydration.  Patient has been maintained on insulin drip. Hospitalist consult requested to manage overall medical issues.  Review of Systems:  Per HPI  Past Medical History: Past Medical History:  Diagnosis Date   Anemia 03/30/2019   Asthma    Asthma 08/25/2014   Diabetes mellitus without complication (HCC)    Hx of varicella    Hypertension    Low grade squamous intraepithelial lesion (LGSIL) on cervicovaginal cytologic smear 03/30/2019   Pregnancy 12/19/2022   Sickle cell trait (HCC)     Subchorionic hematoma 12/19/2022   Moderate size, 4.3 x 3.1 x 2, noted at 7 1/7 week [place].   Uterine leiomyoma 08/25/2014   Vitamin D deficiency 01/05/2023   Noted at [facility], will rx supplement. Noted at [facility], rx'd supplement.     Past Surgical History:  Procedure Laterality Date   CESAREAN SECTION N/A 06/29/2023   Procedure: CESAREAN SECTION;  Surgeon: Jaymes Graff, MD;  Location: [facility];  Service: Obstetrics;  Laterality: N/A;   NO PAST SURGERIES      Medications: Prior to Admission medications   Medication Sig Start Date End Date Taking? Authorizing Provider  acetaminophen (TYLENOL) 500 MG tablet Take 1,000 mg by mouth every 6 (six) hours as needed for moderate pain.   Yes [provider]  albuterol (PROVENTIL HFA;VENTOLIN HFA) 108 (90 Base) MCG/ACT inhaler Inhale 1-2 puffs into the lungs every 6 (six) hours as needed for wheezing or shortness of breath. 09/09/17  Yes Arthor Captain, PA-C  aspirin EC 81 MG tablet Take 81 mg by mouth 2 (two) times daily. Swallow whole.   Yes [provider]  fluticasone (FLOVENT HFA) 110 MCG/ACT inhaler Inhale 2 puffs into the lungs daily.   Yes [provider]  insulin aspart (NOVOLOG) 100 UNIT/ML injection Inject 10 Units into the skin 3 (three) times daily with meals.   Yes [provider]  Iron-Vitamin C (VITRON-C PO) Take 1 tablet by mouth daily.   Yes [provider]  NIFEdipine (ADALAT CC) 30 MG 24 hr tablet Take 30 mg by mouth in the morning and at  bedtime.   Yes [provider]  pantoprazole (PROTONIX) 40 MG tablet Take 40 mg by mouth daily.   Yes [provider]  Prenatal Vit-Fe Fumarate-FA (MULTIVITAMIN-PRENATAL) 27-0.8 MG TABS tablet Take 1 tablet by mouth daily.   Yes [provider]    Allergies:  No Known Allergies  Social History:  reports that she has never smoked. She has never used smokeless tobacco. She reports that she does not drink alcohol and does not use  drugs.  Family History: Family History  Problem Relation Age of Onset   Hypertension Mother    Diabetes Mother    Sickle cell trait Mother    Sickle cell trait Sister    Sickle cell trait Son    Sickle cell trait Maternal Aunt    Cancer Maternal Grandmother    Diabetes Maternal Grandmother    Hypertension Maternal Grandmother    Sickle cell trait Maternal Grandmother    Asthma Neg Hx    Heart disease Neg Hx     Physical Exam: Vitals:   07/04/23 2037 07/04/23 2141 07/04/23 2214 07/04/23 2311  BP: (!) 162/82 (!) 161/84  (!) 149/87  Pulse: (!) 134 (!) 128  (!) 129  Resp: (!) 28 (!) 28  (!) 28  Temp: 98.5 F (36.9 C) (!) 100.7 F (38.2 C)    TempSrc: Oral Oral    SpO2: 91%  (!) 88%   Weight:      Height:        General: A&O x 3, well developed and nourished, NG tube in place Eyes: Pink conjunctiva, no scleral icterus ENT: Moist oral mucosa, neck supple, no thyromegaly Lungs: CTA B/L, no wheeze, no crackles, no use of accessory muscles Cardiovascular: Tachycardia, RRR, no murmurs.  Abdomen: soft, solid bowel.  Distended abdomen.  Positive TTP right middle quadrant GU: not examined Neuro: CN II - XII grossly intact, sensation intact Musculoskeletal: strength 5/5 all extremities, no clubbing or edema Skin: no rash, no subcutaneous crepitation, no decubitus Psych: appropriate weak patient   Labs on Admission:  Recent Labs    07/04/23 1101 07/04/23 2020  NA 139 142  K 3.3* 3.1*  CL 99 100  CO2 26 24  GLUCOSE 126* 144*  BUN 17 18  CREATININE 1.20* 1.14*  CALCIUM 8.1* 8.2*   Recent Labs    07/04/23 1101 07/04/23 2020  AST 21 12*  ALT 15 17  ALKPHOS 69 73  BILITOT 0.8 0.9  PROT 5.3* 5.6*  ALBUMIN 1.9* 1.9*    Recent Labs    07/03/23 0610 07/04/23 2020  WBC 14.2* 10.2  NEUTROABS 12.6* 8.2*  HGB 10.4* 9.1*  HCT 33.1* 28.8*  MCV 77.9* 79.6*  PLT 209 340    Recent Labs    07/04/23 1217  HGBA1C 5.4   Micro Results: Recent Results (from the  past 240 hour(s))  Culture, OB Urine     Status: Abnormal   Collection Time: 07/03/23  5:57 AM   Specimen: Urine, Random  Result Value Ref Range Status   Specimen Description URINE, RANDOM  Final   Special Requests NONE  Final   Culture (A)  Final    MULTIPLE SPECIES PRESENT, SUGGEST RECOLLECTION NO GROUP B STREP (S.AGALACTIAE) ISOLATED Performed at [facility], [address], [place], [place] [address]    Report Status 07/04/2023 FINAL  Final  Respiratory (~20 pathogens) panel by PCR     Status: Abnormal   Collection Time: 07/04/23  4:30 AM   Specimen:  Nasopharyngeal Swab; Respiratory  Result Value Ref Range Status   Adenovirus NOT DETECTED NOT DETECTED Final   Coronavirus 229E NOT DETECTED NOT DETECTED Final    Comment: (NOTE) The Coronavirus on the Respiratory Panel, DOES NOT test for the novel  Coronavirus (2019 nCoV)    Coronavirus HKU1 NOT DETECTED NOT DETECTED Final   Coronavirus NL63 NOT DETECTED NOT DETECTED Final   Coronavirus OC43 NOT DETECTED NOT DETECTED Final   Metapneumovirus NOT DETECTED NOT DETECTED Final   Rhinovirus / Enterovirus DETECTED (A) NOT DETECTED Final   Influenza A NOT DETECTED NOT DETECTED Final   Influenza B NOT DETECTED NOT DETECTED Final   Parainfluenza Virus 1 NOT DETECTED NOT DETECTED Final   Parainfluenza Virus 2 NOT DETECTED NOT DETECTED Final   Parainfluenza Virus 3 NOT DETECTED NOT DETECTED Final   Parainfluenza Virus 4 NOT DETECTED NOT DETECTED Final   Respiratory Syncytial Virus NOT DETECTED NOT DETECTED Final   Bordetella pertussis NOT DETECTED NOT DETECTED Final   Bordetella Parapertussis NOT DETECTED NOT DETECTED Final   Chlamydophila pneumoniae NOT DETECTED NOT DETECTED Final   Mycoplasma pneumoniae NOT DETECTED NOT DETECTED Final    Comment: Performed at [facility], [address], [place], [place] [address]     Radiological Exams on Admission: CT ABDOMEN PELVIS W CONTRAST  Result Date: 07/04/2023 CLINICAL  DATA:  Postop C-section with bowel dilatation EXAM: CT ABDOMEN AND PELVIS WITH CONTRAST TECHNIQUE: Multidetector CT imaging of the abdomen and pelvis was performed using the standard protocol following bolus administration of intravenous contrast. RADIATION DOSE REDUCTION: This exam was performed according to the departmental dose-optimization program which includes automated exposure control, adjustment of the mA and/or kV according to patient size and/or use of iterative reconstruction technique. CONTRAST:  65mL OMNIPAQUE IOHEXOL 350 MG/ML SOLN COMPARISON:  Radiograph 07/04/2023 FINDINGS: Lower chest: Lung bases demonstrate bilateral lower lobe consolidations. No significant pleural effusion. Hepatobiliary: No focal liver abnormality is seen. No gallstones, gallbladder wall thickening, or biliary dilatation. Pancreas: Unremarkable. No pancreatic ductal dilatation or surrounding inflammatory changes. Spleen: Normal in size without focal abnormality. Adrenals/Urinary Tract: Adrenal glands are within normal limits. The kidneys show no hydronephrosis. The bladder is normal Stomach/Bowel: Esophageal tube tip in the body of stomach. Marked fluid distension of the stomach. Multiple loops of fluid-filled dilated small bowel measuring up to 6 cm. Peripheral eyes gas collections within the jejunum, series 3, image 50 with areas of non dependent gas, raising some concern for pneumatosis. No bowel wall thickening. Terminal ileum and distal small bowel are decompressed with hooked configuration of the central small bowel on series 3 image 43 through 59. right and transverse colon are moderately distended with fluid and air, the left colon is fluid-filled and relatively decompressed to the rectum. Vascular/Lymphatic: Nonaneurysmal aorta.  No suspicious lymph nodes. Reproductive: Enlarged uterus consistent with recent postpartum status. Enhancing mass along the posterior uterine corpus which may represent a fibroid. Other: No  discrete free air is seen. Small volume free fluid in the upper abdomen with moderate fluid in the right greater than left lower quadrants. Low ventral incision with gas along the low anterior abdominal wall corresponding to history of C-section. No herniation of uterus or bowel contents at this location. Musculoskeletal: No acute or suspicious osseous abnormality. IMPRESSION: 1. Enlarged uterus consistent with recent postpartum status. Gas along the low anterior abdominal wall presumably related to history of C-section, no herniation of uterus or bowel in the region of C-section incision. 2. Marked fluid  distension of stomach. Multiple loops of fluid and air distended small bowel with additional finding of moderate fluid and air distension of proximal and transverse colon. Though much of the findings could be related to postoperative ileus, there is definite appearance of caliber change of the small bowel centrally within the abdomen with abnormal angular configuration of this central small bowel, raising concern for bowel obstruction. Furthermore there is abnormal appearance of proximal small bowel/jejunal loops which demonstrate non dependent peripheral gas, raising concern for pneumatosis. No free gas or portal venous gas. 3. Extensive bilateral lower lobe consolidation with mild consolidation at the lingula. Findings could be due to pneumonia or possible aspiration. Critical Value/emergent results were called by telephone at the time of interpretation on 07/04/2023 at 10:49 pm to provider MELISSA DAVIES , who verbally acknowledged these results. Electronically Signed   By: Jasmine Pang M.D.   On: 07/04/2023 22:49   DG Abd 1 View  Result Date: 07/04/2023 CLINICAL DATA:  Enteric tube placement EXAM: ABDOMEN - 1 VIEW COMPARISON:  Abdominal radiograph dated 07/04/2023 FINDINGS: Gastric/enteric tube tip projects over the medial left hemiabdomen, likely in the stomach. Partially imaged gaseous dilation of bowel  loops. Imaged lung bases demonstrate patchy and hazy opacities. IMPRESSION: Gastric/enteric tube tip projects over the medial left hemiabdomen, likely in the stomach. Electronically Signed   By: Agustin Cree M.D.   On: 07/04/2023 18:50   DG Abd 1 View  Result Date: 07/04/2023 CLINICAL DATA:  Vomiting.  Pain EXAM: ABDOMEN - 1 VIEW COMPARISON:  None Available. FINDINGS: Numerous dilated loops of bowel identified both small and large bowel. Small bowel loops are measured in diameter up to 7 cm. Question some fold thickening. Moderate right-sided colonic stool. Films are under penetrated on these portable supine radiographs. No obvious free air IMPRESSION: Dilated bowel seen diffusely including severe dilatation of small-bowel loops up to 7 cm. Right-sided stool. Areas of fold thickening. Differential includes developing obstruction or ileus. Follow up CT scan with IV contrast may be useful to further delineate. Limited radiographs Electronically Signed   By: Karen Kays M.D.   On: 07/04/2023 14:31   DG Chest 2 View  Result Date: 07/04/2023 CLINICAL DATA:  Shortness of breath.  Postpartum. EXAM: CHEST - 2 VIEW COMPARISON:  03/31/2019 FINDINGS: Two views of the chest were obtained. Cardiac silhouette is enlarged. Patchy densities at both lung bases, right side greater than left. The right hemidiaphragm is obscured. Findings are suggestive for atelectasis and probably right pleural fluid. Central vascular structures are mildly prominent. Negative for a pneumothorax. Trachea is midline. IMPRESSION: 1. Cardiomegaly with mild vascular congestion. 2. Bibasilar densities, right side greater than left. Findings are suggestive for atelectasis and right pleural fluid. Electronically Signed   By: Richarda Overlie M.D.   On: 07/04/2023 09:16    Assessment/Plan Present on Admission:  Pneumonia -Likely combination of aspiration and RSV pneumonia -Continue supportive care for RSV treatment -Blood cultures x 2 collected, covered  w/ cefepime and flagyl (was on unasyn 24hrs).  Will discontinue IV Flagyl.  Will reevaluate in a.m., further de-escalation of antibiotic likely -Low grade fever likely secondary to both RSV/aspiration PNA, cover w/ tylenol -Duonebs PRN.  Patient stable on room air -Lactic acid normal X 3   Postop ileus -NGT in place, continue LWIS -Continue monitor I&O's -Maintain IVF rehydration -Patient nausea and vomiting secondary to this.  Will discontinue IV Reglan -Continue IV protonix  -Continue PRN zofran   Hypokalemia -Secondary to nausea and vomiting -Repleting  IV. BMP in AM -Continue antiemetics PRN   Hypoalbuminemia -1.9 ??.  Patient endorses poor p.o. intake during pregnancy.  Follow-up labs outpatient.   AKI -Improving w/ hydration.  Creatinine decreasing 1.2 => 1.1 -UOP unclear.  Foley placed.  Strict I's and O's   Mild pulmonary congestion -Cardiomegaly w/ mild congestion noted on CXR.  IV Lasix x 1 given. -CXR can over-read cardiomegaly, however, will get 2D echo and add BNP -Albumin 1.9 may contribute to third spacing of fluids   T2DM -SSI -Patient is on IV Endo tool.  Continued   Chronic hypertension affecting pregnancy -PRN BP meds, who patient NPO w/ NGT in place   Asthma -Pulmicort resumed.  As needed nebulizers   Post partum twins -s/p C section and PPH.  Care per OB/GYN  Mercedies Ganesh 07/05/2023, 12:35 AM

## 2023-07-05 NOTE — Progress Notes (Signed)
PROGRESS NOTE    Erika Lewis  FAO:[id number] DOB: 1986/06/12 DOA: 06/29/2023 PCP: Nigel Bridgeman, CNM   Brief Narrative: 37 year old with past medical history significant for diabetes type 2, hypertension, asthma, sickle cell trait underwent C-section and salpingectomy on 9/28.  Postoperative patient had postpartum hemorrhage.  She was transfused and received IV iron.  She developed abdominal distention, nausea and vomiting.  CT abdomen and pelvis showed marked fluid distention of the stomach and distended small bowel loop.  Extensive bilateral consolidation in the lungs.  Negative for fascial dehiscence.  Hospitalist consulted for management of pneumonia and medical problems. Post surgery patient develop SBO versus ileus, pneumonia, also found to have RSV.   Assessment & Plan:   Principal Problem:   Chronic hypertension affecting pregnancy Active Problems:   S/P cesarean section   Acute postoperative anemia due to greater than expected blood loss   Postpartum hemorrhage   Rhinovirus   RSV (respiratory syncytial virus pneumonia)  Thank you for consult, will continue to follow with you  1-Pneumonia: Aspiration and RSV pneumonia Per care for RSV Follow blood cultures Lactic acid negative Continue with Cefepime and Flagyl.  Oxygen sat stable on 2 L.   Postoperative ileus versus SBO Surgery Following NG tube placed yielding 900 cc Management per Sx.  She hd BM today.  Replete electrolytes.   Hypokalemia: Replete IV. Check Mg level.   Hypoalbuminemia She will need supplement when tolerating oral. .   AKI Foley catheter in place Improved With hydration  Mild Pulmonary congestion Cardiomegaly w/ mild congestion noted on CXR.  IV Lasix x 1 given.  ECHO ordered.  BNP 31.  DM type 2: Continue with SSI  Chronic HTN, affecting pregnancy PRN Hydralazine, labetalol.   Asthma: Pulmicort. PRN albuterol.   Post Partum Twins. PPH S/P C section and PPH/  Per GYN  OB Hb stable.      Estimated body mass index is 43.62 kg/m as calculated from the following:   Height as of this encounter: 5\' 4"  (1.626 m).   Weight as of this encounter: 115.3 kg.   DVT prophylaxis: SCD Code Status: Full code Family Communication: Care discussed with patient Disposition Plan:  Status is: Inpatient Remains inpatient appropriate because: management of PNA   Antimicrobials:    Subjective: She is feeling better, cough not worse. She had BM.    Objective: Vitals:   07/04/23 2311 07/05/23 0134 07/05/23 0400 07/05/23 0428  BP: (!) 149/87   139/80  Pulse: (!) 129   (!) 105  Resp: (!) 28   20  Temp:  98.8 F (37.1 C)  98.5 F (36.9 C)  TempSrc:  Oral  Oral  SpO2:  97% 97%   Weight:      Height:        Intake/Output Summary (Last 24 hours) at 07/05/2023 0732 Last data filed at 07/05/2023 0655 Gross per 24 hour  Intake 491.86 ml  Output 4101 ml  Net -3609.14 ml   Filed Weights   06/29/23 0940  Weight: 115.3 kg    Examination:  General exam: Appears calm and comfortable, NG tube in place  Respiratory system: Clear to auscultation. Respiratory effort normal. Cardiovascular system: S1 & S2 heard, RRR. No JVD, murmurs, rubs, gallops or clicks. No pedal edema. Gastrointestinal system: Abdomen is distended. NT Central nervous system: Alert and oriented. Extremities: Symmetric 5 x 5 power.    Data Reviewed: I have personally reviewed following labs and imaging studies  CBC: Recent Labs  Lab 06/29/23  1302 06/30/23 0505 07/03/23 0610 07/04/23 2020 07/05/23 0203  WBC 11.8* 11.2* 14.2* 10.2 8.1  NEUTROABS  --   --  12.6* 8.2* 6.6  HGB 9.1* 7.9* 10.4* 9.1* 9.4*  HCT 28.6* 25.4* 33.1* 28.8* 29.9*  MCV 78.8* 78.2* 77.9* 79.6* 77.3*  PLT 143* 127* 209 340 266   Basic Metabolic Panel: Recent Labs  Lab 06/29/23 1201 06/30/23 0505 07/01/23 0813 07/02/23 0507 07/04/23 1101 07/04/23 2020 07/05/23 0203  NA 139  --   --   --  139 142 144  K  4.1  --   --   --  3.3* 3.1* 3.0*  CL  --   --   --   --  99 100 101  CO2  --   --   --   --  26 24 25   GLUCOSE  --   --  92 106* 126* 144* 149*  BUN  --   --   --   --  17 18 20   CREATININE  --  0.89  --   --  1.20* 1.14* 1.12*  CALCIUM  --   --   --   --  8.1* 8.2* 8.1*   GFR: Estimated Creatinine Clearance: 86.5 mL/min (A) (by C-G formula based on SCr of 1.12 mg/dL (H)). Liver Function Tests: Recent Labs  Lab 07/04/23 1101 07/04/23 2020 07/05/23 0203  AST 21 12* 12*  ALT 15 17 15   ALKPHOS 69 73 58  BILITOT 0.8 0.9 0.1*  PROT 5.3* 5.6* 5.0*  ALBUMIN 1.9* 1.9* 1.7*   No results for input(s): "LIPASE", "AMYLASE" in the last 168 hours. No results for input(s): "AMMONIA" in the last 168 hours. Coagulation Profile: Recent Labs  Lab 07/04/23 2020  INR 1.2   Cardiac Enzymes: No results for input(s): "CKTOTAL", "CKMB", "CKMBINDEX", "TROPONINI" in the last 168 hours. BNP (last 3 results) No results for input(s): "PROBNP" in the last 8760 hours. HbA1C: Recent Labs    07/04/23 1217  HGBA1C 5.4   CBG: Recent Labs  Lab 07/04/23 1604 07/04/23 1703 07/04/23 1821 07/04/23 1901 07/05/23 0225  GLUCAP 137* 143* 151* 135* 155*   Lipid Profile: No results for input(s): "CHOL", "HDL", "LDLCALC", "TRIG", "CHOLHDL", "LDLDIRECT" in the last 72 hours. Thyroid Function Tests: No results for input(s): "TSH", "T4TOTAL", "FREET4", "T3FREE", "THYROIDAB" in the last 72 hours. Anemia Panel: No results for input(s): "VITAMINB12", "FOLATE", "FERRITIN", "TIBC", "IRON", "RETICCTPCT" in the last 72 hours. Sepsis Labs: Recent Labs  Lab 07/04/23 2020 07/04/23 2226  LATICACIDVEN 1.9 1.5    Recent Results (from the past 240 hour(s))  Culture, OB Urine     Status: Abnormal   Collection Time: 07/03/23  5:57 AM   Specimen: Urine, Random  Result Value Ref Range Status   Specimen Description URINE, RANDOM  Final   Special Requests NONE  Final   Culture (A)  Final    MULTIPLE SPECIES  PRESENT, SUGGEST RECOLLECTION NO GROUP B STREP (S.AGALACTIAE) ISOLATED Performed at [facility], [address], [place], [place] [address]    Report Status 07/04/2023 FINAL  Final  Respiratory (~20 pathogens) panel by PCR     Status: Abnormal   Collection Time: 07/04/23  4:30 AM   Specimen: Nasopharyngeal Swab; Respiratory  Result Value Ref Range Status   Adenovirus NOT DETECTED NOT DETECTED Final   Coronavirus 229E NOT DETECTED NOT DETECTED Final    Comment: (NOTE) The Coronavirus on the Respiratory Panel, DOES NOT test for the novel  Coronavirus (  2019 nCoV)    Coronavirus HKU1 NOT DETECTED NOT DETECTED Final   Coronavirus NL63 NOT DETECTED NOT DETECTED Final   Coronavirus OC43 NOT DETECTED NOT DETECTED Final   Metapneumovirus NOT DETECTED NOT DETECTED Final   Rhinovirus / Enterovirus DETECTED (A) NOT DETECTED Final   Influenza A NOT DETECTED NOT DETECTED Final   Influenza B NOT DETECTED NOT DETECTED Final   Parainfluenza Virus 1 NOT DETECTED NOT DETECTED Final   Parainfluenza Virus 2 NOT DETECTED NOT DETECTED Final   Parainfluenza Virus 3 NOT DETECTED NOT DETECTED Final   Parainfluenza Virus 4 NOT DETECTED NOT DETECTED Final   Respiratory Syncytial Virus NOT DETECTED NOT DETECTED Final   Bordetella pertussis NOT DETECTED NOT DETECTED Final   Bordetella Parapertussis NOT DETECTED NOT DETECTED Final   Chlamydophila pneumoniae NOT DETECTED NOT DETECTED Final   Mycoplasma pneumoniae NOT DETECTED NOT DETECTED Final    Comment: Performed at [facility], [address] [place], [place] [address]  Culture, blood (x 2)     Status: None (Preliminary result)   Collection Time: 07/04/23  8:20 PM   Specimen: BLOOD RIGHT ARM  Result Value Ref Range Status   Specimen Description BLOOD RIGHT ARM  Final   Special Requests   Final    BOTTLES DRAWN AEROBIC AND ANAEROBIC Blood Culture adequate volume   Culture   Final    NO GROWTH < 12 HOURS Performed at [facility], [address] [place], [place] [address]    Report Status PENDING  Incomplete  Culture, blood (x 2)     Status: None (Preliminary result)   Collection Time: 07/04/23  8:21 PM   Specimen: BLOOD RIGHT HAND  Result Value Ref Range Status   Specimen Description BLOOD RIGHT HAND  Final   Special Requests   Final    BOTTLES DRAWN AEROBIC AND ANAEROBIC Blood Culture adequate volume   Culture   Final    NO GROWTH < 12 HOURS Performed at [facility], [address], [place], [place] [address]    Report Status PENDING  Incomplete         Radiology Studies: CT ABDOMEN PELVIS W CONTRAST  Result Date: 07/04/2023 CLINICAL DATA:  Postop C-section with bowel dilatation EXAM: CT ABDOMEN AND PELVIS WITH CONTRAST TECHNIQUE: Multidetector CT imaging of the abdomen and pelvis was performed using the standard protocol following bolus administration of intravenous contrast. RADIATION DOSE REDUCTION: This exam was performed according to the departmental dose-optimization program which includes automated exposure control, adjustment of the mA and/or kV according to patient size and/or use of iterative reconstruction technique. CONTRAST:  65mL OMNIPAQUE IOHEXOL 350 MG/ML SOLN COMPARISON:  Radiograph 07/04/2023 FINDINGS: Lower chest: Lung bases demonstrate bilateral lower lobe consolidations. No significant pleural effusion. Hepatobiliary: No focal liver abnormality is seen. No gallstones, gallbladder wall thickening, or biliary dilatation. Pancreas: Unremarkable. No pancreatic ductal dilatation or surrounding inflammatory changes. Spleen: Normal in size without focal abnormality. Adrenals/Urinary Tract: Adrenal glands are within normal limits. The kidneys show no hydronephrosis. The bladder is normal Stomach/Bowel: Esophageal tube tip in the body of stomach. Marked fluid distension of the stomach. Multiple loops of fluid-filled dilated small bowel measuring up to 6 cm. Peripheral eyes gas collections within  the jejunum, series 3, image 50 with areas of non dependent gas, raising some concern for pneumatosis. No bowel wall thickening. Terminal ileum and distal small bowel are decompressed with hooked configuration of the central small bowel on series 3 image  43 through 59. right and transverse colon are moderately distended with fluid and air, the left colon is fluid-filled and relatively decompressed to the rectum. Vascular/Lymphatic: Nonaneurysmal aorta.  No suspicious lymph nodes. Reproductive: Enlarged uterus consistent with recent postpartum status. Enhancing mass along the posterior uterine corpus which may represent a fibroid. Other: No discrete free air is seen. Small volume free fluid in the upper abdomen with moderate fluid in the right greater than left lower quadrants. Low ventral incision with gas along the low anterior abdominal wall corresponding to history of C-section. No herniation of uterus or bowel contents at this location. Musculoskeletal: No acute or suspicious osseous abnormality. IMPRESSION: 1. Enlarged uterus consistent with recent postpartum status. Gas along the low anterior abdominal wall presumably related to history of C-section, no herniation of uterus or bowel in the region of C-section incision. 2. Marked fluid distension of stomach. Multiple loops of fluid and air distended small bowel with additional finding of moderate fluid and air distension of proximal and transverse colon. Though much of the findings could be related to postoperative ileus, there is definite appearance of caliber change of the small bowel centrally within the abdomen with abnormal angular configuration of this central small bowel, raising concern for bowel obstruction. Furthermore there is abnormal appearance of proximal small bowel/jejunal loops which demonstrate non dependent peripheral gas, raising concern for pneumatosis. No free gas or portal venous gas. 3. Extensive bilateral lower lobe consolidation with  mild consolidation at the lingula. Findings could be due to pneumonia or possible aspiration. Critical Value/emergent results were called by telephone at the time of interpretation on 07/04/2023 at 10:49 pm to provider MELISSA DAVIES , who verbally acknowledged these results. Electronically Signed   By: Jasmine Pang M.D.   On: 07/04/2023 22:49   DG Abd 1 View  Result Date: 07/04/2023 CLINICAL DATA:  Enteric tube placement EXAM: ABDOMEN - 1 VIEW COMPARISON:  Abdominal radiograph dated 07/04/2023 FINDINGS: Gastric/enteric tube tip projects over the medial left hemiabdomen, likely in the stomach. Partially imaged gaseous dilation of bowel loops. Imaged lung bases demonstrate patchy and hazy opacities. IMPRESSION: Gastric/enteric tube tip projects over the medial left hemiabdomen, likely in the stomach. Electronically Signed   By: Agustin Cree M.D.   On: 07/04/2023 18:50   DG Abd 1 View  Result Date: 07/04/2023 CLINICAL DATA:  Vomiting.  Pain EXAM: ABDOMEN - 1 VIEW COMPARISON:  None Available. FINDINGS: Numerous dilated loops of bowel identified both small and large bowel. Small bowel loops are measured in diameter up to 7 cm. Question some fold thickening. Moderate right-sided colonic stool. Films are under penetrated on these portable supine radiographs. No obvious free air IMPRESSION: Dilated bowel seen diffusely including severe dilatation of small-bowel loops up to 7 cm. Right-sided stool. Areas of fold thickening. Differential includes developing obstruction or ileus. Follow up CT scan with IV contrast may be useful to further delineate. Limited radiographs Electronically Signed   By: Karen Kays M.D.   On: 07/04/2023 14:31   DG Chest 2 View  Result Date: 07/04/2023 CLINICAL DATA:  Shortness of breath.  Postpartum. EXAM: CHEST - 2 VIEW COMPARISON:  03/31/2019 FINDINGS: Two views of the chest were obtained. Cardiac silhouette is enlarged. Patchy densities at both lung bases, right side greater than left.  The right hemidiaphragm is obscured. Findings are suggestive for atelectasis and probably right pleural fluid. Central vascular structures are mildly prominent. Negative for a pneumothorax. Trachea is midline. IMPRESSION: 1. Cardiomegaly with mild vascular congestion. 2.  Bibasilar densities, right side greater than left. Findings are suggestive for atelectasis and right pleural fluid. Electronically Signed   By: Richarda Overlie M.D.   On: 07/04/2023 09:16        Scheduled Meds:  budesonide  0.25 mg Nebulization BID   insulin aspart  0-15 Units Subcutaneous Q4H   NIFEdipine  60 mg Oral Daily   pantoprazole (PROTONIX) IV  40 mg Intravenous Q2000   prenatal multivitamin  1 tablet Oral Q1200   senna-docusate  2 tablet Oral Q24H   simethicone  80 mg Oral TID PC   Tdap  0.5 mL Intramuscular Once   Continuous Infusions:  acetaminophen 1,000 mg (07/04/23 2318)   ceFEPime (MAXIPIME) IV 2 g (07/05/23 0426)   dextrose 5% lactated ringers Stopped (07/04/23 1431)   lactated ringers 1,000 mL with potassium chloride 20 mEq infusion 125 mL/hr at 07/05/23 0422   naloxone HCl (NARCAN) 2 mg in dextrose 5 % 250 mL infusion       LOS: 6 days    Time spent: 35 minutes    Armie Moren A Chantee Cerino, MD [facility]   If 7PM-7AM, please contact night-coverage [email or web address]  07/05/2023, 7:32 AM

## 2023-07-05 NOTE — Progress Notes (Signed)
Subjective: Postpartum Day 6: Cesarean Delivery on 06/29/23 Readmitted yesterday d/t abdominal pain found to have ileus vs SBO Patient reports + flatus, + BM, and no problems voiding.    Objective: Vital signs in last 24 hours: Temp:  [98.5 F (36.9 C)-100.7 F (38.2 C)] 98.5 F (36.9 C) (10/04 1231) Pulse Rate:  [105-134] 116 (10/04 1231) Resp:  [18-28] 20 (10/04 1231) BP: (138-173)/(75-87) 138/81 (10/04 1231) SpO2:  [86 %-98 %] 90 % (10/04 1231) 1200cc out via NG tube  Physical Exam:  General: alert, cooperative, and mild distress Lochia: appropriate Uterine Fundus: abdomen too distended to feel fundus Abdomen: firmly distended and tender Incision: healing well DVT Evaluation: SCDs on, no calf tenderness  Recent Labs    07/04/23 2020 07/05/23 0203  HGB 9.1* 9.4*  HCT 28.8* 29.9*    Assessment/Plan: Status post Cesarean section readmitted with ileus vs SBO followed by general surgery and hospitalist.  1.Postoperative course complicated by ileus vs SBO .  Appreciate General surgery input who feels this is an ileus.  Pt has NG tube in place and clinical improvement anticipated.  SBO protocol with contrast at 2040 tonight. 2.CHTN - on procardia XL 60mg  but refused it this morning d/t sore throat likely from NG tube. 3.Type 2 DM - currently on q4 CBGs with insulin coverage but pt is NPO except meds 4.Mild pulmonary congestion - complete echo ordered but unable to be done d/t tachycardia.  Tech plans to ask cardiologist for recommendations. 5.Pneumonia - aspiration and RSV - per care for RSV, follow blood cxs, cont cefepime and flagyl, O2Sat ok on 2L oxygen via Accident.  Encourage IS. 6.Tachycardic - likely d/t dehydration with significant output via NG tube but rate decreased to 100cc/hr by hospitalist likely d/t the pulmonary congestion. 7.SCDs to bilateral LEs 8.Continue current care.  Failla Nails, MD 07/05/2023, 2:42 PM

## 2023-07-05 NOTE — Lactation Note (Signed)
This note was copied from a baby's chart.  NICU Lactation Consultation Note  Patient Name: Erika Lewis [id number] Date: 07/05/2023 Age:37 years  Reason for consult: Follow-up assessment; NICU baby; Multiple gestation; Infant < 6lbs; Maternal endocrine disorder; Late-preterm 34-36.6wks; Other (Comment) (cHTN, IUGR, AMA) Type of Endocrine Disorder?: Diabetes (DM2 (insulin))  SUBJECTIVE LC in the room to visit with family of 37 years old LPI NICU twin female, Ms. Erika Lewis was transferred to [facility] yesterday due to a small bowel obstruction, she continue throwing up and feeling unwell. She requested LC to come back tomorrow or at another time, [facility] RN [place] voiced that she hasn't done any pumping due to her status but that she's not getting engorged either. Revised engorgement prevention/treatment with RN, she said they have gotten ice packs in the room but not for breasts; but they can be used for that purpose if needed. LC services to F/U on 07/07/2023.  OBJECTIVE Infant data: Mother's Current Feeding Choice: Breast Milk and Formula  O2 Device: Room Air  Infant feeding assessment Scale for Readiness: 2 Scale for Quality: 4   Maternal data: [id number] C-Section, Low Transverse Pumping frequency: Has not pumped the last 2 days since transfer to [facility] Pumped volume: 0 mL Flange Size: 21 Risk factor for low/delayed milk supply:: C/S, prematurity, DM2, AMA, 1773 cc PPH, lack of pumping due to maternal status as 07/05/2023  Pump: Personal ([place] DEBP)  ASSESSMENT Infant: Feeding Status: Continuous gastric feedings Feeding method: Continuous Gastric Nipple Type: Nfant Slow Flow (purple)  Maternal: Milk volume: Low  INTERVENTIONS/PLAN Interventions: Tools: Pump; Flanges Pump Education: Setup, frequency, and cleaning; Milk Storage  Plan: Consult Status: NICU follow-up NICU Follow-up type: Verify absence of engorgement; Verify onset of copious milk   Elvert Cumpton S  Ashlee Player 07/05/2023, 12:40 PM

## 2023-07-06 ENCOUNTER — Inpatient Hospital Stay (HOSPITAL_COMMUNITY): Payer: Medicaid Other | Admitting: Registered Nurse

## 2023-07-06 ENCOUNTER — Other Ambulatory Visit: Payer: Self-pay

## 2023-07-06 ENCOUNTER — Inpatient Hospital Stay (HOSPITAL_COMMUNITY): Payer: Medicaid Other

## 2023-07-06 ENCOUNTER — Encounter (HOSPITAL_COMMUNITY)
Admission: RE | Disposition: A | Discharge status: Home / Self Care | Payer: Self-pay | Source: Home / Self Care | Attending: Obstetrics and Gynecology

## 2023-07-06 DIAGNOSIS — I517 Cardiomegaly: Secondary | ICD-10-CM

## 2023-07-06 DIAGNOSIS — J121 Respiratory syncytial virus pneumonia: Secondary | ICD-10-CM | POA: Diagnosis not present

## 2023-07-06 DIAGNOSIS — K559 Vascular disorder of intestine, unspecified: Secondary | ICD-10-CM

## 2023-07-06 DIAGNOSIS — K567 Ileus, unspecified: Secondary | ICD-10-CM | POA: Diagnosis not present

## 2023-07-06 HISTORY — PX: LAPAROTOMY: SHX154

## 2023-07-06 LAB — GLUCOSE, CAPILLARY
Glucose-Capillary: 104 mg/dL — ABNORMAL HIGH (ref 70–99)
Glucose-Capillary: 111 mg/dL — ABNORMAL HIGH (ref 70–99)
Glucose-Capillary: 121 mg/dL — ABNORMAL HIGH (ref 70–99)
Glucose-Capillary: 123 mg/dL — ABNORMAL HIGH (ref 70–99)
Glucose-Capillary: 125 mg/dL — ABNORMAL HIGH (ref 70–99)
Glucose-Capillary: 96 mg/dL (ref 70–99)

## 2023-07-06 LAB — CBC WITH DIFFERENTIAL/PLATELET
Abs Immature Granulocytes: 0 10*3/uL (ref 0.00–0.07)
Basophils Absolute: 0 10*3/uL (ref 0.0–0.1)
Basophils Relative: 0 %
Eosinophils Absolute: 0 10*3/uL (ref 0.0–0.5)
Eosinophils Relative: 0 %
HCT: 30.7 % — ABNORMAL LOW (ref 36.0–46.0)
Hemoglobin: 9.4 g/dL — ABNORMAL LOW (ref 12.0–15.0)
Lymphocytes Relative: 7 %
Lymphs Abs: 0.8 10*3/uL (ref 0.7–4.0)
MCH: 24.2 pg — ABNORMAL LOW (ref 26.0–34.0)
MCHC: 30.6 g/dL (ref 30.0–36.0)
MCV: 78.9 fL — ABNORMAL LOW (ref 80.0–100.0)
Monocytes Absolute: 0.9 10*3/uL (ref 0.1–1.0)
Monocytes Relative: 8 %
Neutro Abs: 9.7 10*3/uL — ABNORMAL HIGH (ref 1.7–7.7)
Neutrophils Relative %: 85 %
Platelets: 300 10*3/uL (ref 150–400)
RBC: 3.89 MIL/uL (ref 3.87–5.11)
RDW: 28.7 % — ABNORMAL HIGH (ref 11.5–15.5)
WBC: 11.4 10*3/uL — ABNORMAL HIGH (ref 4.0–10.5)
nRBC: 0 /100{WBCs}
nRBC: 0.2 % (ref 0.0–0.2)

## 2023-07-06 LAB — COMPREHENSIVE METABOLIC PANEL
ALT: 13 U/L (ref 0–44)
AST: 15 U/L (ref 15–41)
Albumin: 1.8 g/dL — ABNORMAL LOW (ref 3.5–5.0)
Alkaline Phosphatase: 65 U/L (ref 38–126)
Anion gap: 13 (ref 5–15)
BUN: 19 mg/dL (ref 6–20)
CO2: 28 mmol/L (ref 22–32)
Calcium: 8.2 mg/dL — ABNORMAL LOW (ref 8.9–10.3)
Chloride: 107 mmol/L (ref 98–111)
Creatinine, Ser: 0.8 mg/dL (ref 0.44–1.00)
GFR, Estimated: >60 mL/min (ref >60)
Glucose, Bld: 126 mg/dL — ABNORMAL HIGH (ref 70–99)
Potassium: 2.9 mmol/L — ABNORMAL LOW (ref 3.5–5.1)
Sodium: 148 mmol/L — ABNORMAL HIGH (ref 135–145)
Total Bilirubin: 0.6 mg/dL (ref 0.3–1.2)
Total Protein: 5.4 g/dL — ABNORMAL LOW (ref 6.5–8.1)

## 2023-07-06 LAB — ECHOCARDIOGRAM COMPLETE
Height: 64 in
S' Lateral: 2.8 cm
Weight: 4065.6 [oz_av]

## 2023-07-06 LAB — MAGNESIUM: Magnesium: 2.7 mg/dL — ABNORMAL HIGH (ref 1.7–2.4)

## 2023-07-06 SURGERY — LAPAROTOMY, EXPLORATORY
Anesthesia: General | Site: Abdomen

## 2023-07-06 MED ORDER — FENTANYL CITRATE (PF) 250 MCG/5ML IJ SOLN
INTRAMUSCULAR | Status: DC | PRN
  Administered 2023-07-06 (×3): 50 ug via INTRAVENOUS
  Administered 2023-07-06: 100 ug via INTRAVENOUS

## 2023-07-06 MED ORDER — HYDROMORPHONE 1 MG/ML IV SOLN
INTRAVENOUS | Status: DC
  Administered 2023-07-06: 30 mg via INTRAVENOUS
  Filled 2023-07-06: qty 30

## 2023-07-06 MED ORDER — ACETAMINOPHEN 10 MG/ML IV SOLN
INTRAVENOUS | Status: DC | PRN
Start: 2023-07-06 — End: 2023-07-06
  Administered 2023-07-06: 1000 mg via INTRAVENOUS

## 2023-07-06 MED ORDER — KETOROLAC TROMETHAMINE 30 MG/ML IJ SOLN
30.0000 mg | Freq: Once | INTRAMUSCULAR | Status: AC | PRN
  Administered 2023-07-06: 30 mg via INTRAVENOUS

## 2023-07-06 MED ORDER — DIPHENHYDRAMINE HCL 50 MG/ML IJ SOLN: 12.5000 mg | Freq: Four times a day (QID) | INTRAMUSCULAR | Status: DC | PRN

## 2023-07-06 MED ORDER — ROCURONIUM BROMIDE 10 MG/ML (PF) SYRINGE
PREFILLED_SYRINGE | INTRAVENOUS | Status: DC | PRN
  Administered 2023-07-06: 30 mg via INTRAVENOUS
  Administered 2023-07-06: 50 mg via INTRAVENOUS

## 2023-07-06 MED ORDER — SENNOSIDES-DOCUSATE SODIUM 8.6-50 MG PO TABS
2.0000 | ORAL_TABLET | Freq: Every day | ORAL | Status: DC
  Administered 2023-07-10: 2 via ORAL
  Filled 2023-07-06 (×2): qty 2

## 2023-07-06 MED ORDER — COCONUT OIL OIL: 1.0000 | TOPICAL_OIL | Status: DC | PRN

## 2023-07-06 MED ORDER — ENOXAPARIN SODIUM 60 MG/0.6ML IJ SOSY
60.0000 mg | PREFILLED_SYRINGE | INTRAMUSCULAR | Status: DC
  Administered 2023-07-06 – 2023-07-09 (×4): 60 mg via SUBCUTANEOUS
  Filled 2023-07-06 (×4): qty 0.6

## 2023-07-06 MED ORDER — SUCCINYLCHOLINE CHLORIDE 200 MG/10ML IV SOSY
PREFILLED_SYRINGE | INTRAVENOUS | Status: DC | PRN
  Administered 2023-07-06: 120 mg via INTRAVENOUS

## 2023-07-06 MED ORDER — SCOPOLAMINE 1 MG/3DAYS TD PT72
MEDICATED_PATCH | TRANSDERMAL | Status: DC | PRN
  Administered 2023-07-06: 1 via TRANSDERMAL

## 2023-07-06 MED ORDER — SODIUM CHLORIDE 0.9% FLUSH: 9.0000 mL | INTRAVENOUS | Status: DC | PRN

## 2023-07-06 MED ORDER — LACTATED RINGERS IV SOLN
INTRAVENOUS | Status: DC | PRN
Start: 2023-07-06 — End: 2023-07-06

## 2023-07-06 MED ORDER — OXYCODONE HCL 5 MG/5ML PO SOLN: 5.0000 mg | Freq: Once | ORAL | Status: DC | PRN

## 2023-07-06 MED ORDER — PRENATAL MULTIVITAMIN CH
1.0000 | ORAL_TABLET | Freq: Every day | ORAL | Status: DC
  Administered 2023-07-08 – 2023-07-10 (×3): 1 via ORAL
  Filled 2023-07-06 (×3): qty 1

## 2023-07-06 MED ORDER — PROPOFOL 10 MG/ML IV BOLUS
INTRAVENOUS | Status: DC | PRN
  Administered 2023-07-06: 130 mg via INTRAVENOUS

## 2023-07-06 MED ORDER — ONDANSETRON HCL 4 MG/2ML IJ SOLN
INTRAMUSCULAR | Status: AC
  Filled 2023-07-06: qty 2

## 2023-07-06 MED ORDER — DEXAMETHASONE SODIUM PHOSPHATE 10 MG/ML IJ SOLN
INTRAMUSCULAR | Status: DC | PRN
  Administered 2023-07-06: 5 mg via INTRAVENOUS

## 2023-07-06 MED ORDER — PROPOFOL 10 MG/ML IV BOLUS
INTRAVENOUS | Status: AC
  Filled 2023-07-06: qty 20

## 2023-07-06 MED ORDER — ONDANSETRON HCL 4 MG/2ML IJ SOLN
INTRAMUSCULAR | Status: DC | PRN
  Administered 2023-07-06: 4 mg via INTRAVENOUS

## 2023-07-06 MED ORDER — POTASSIUM CHLORIDE 10 MEQ/100ML IV SOLN
10.0000 meq | INTRAVENOUS | Status: AC
  Administered 2023-07-06 (×4): 10 meq via INTRAVENOUS
  Filled 2023-07-06 (×4): qty 100

## 2023-07-06 MED ORDER — POTASSIUM CHLORIDE 10 MEQ/100ML IV SOLN: 10.0000 meq | INTRAVENOUS | Status: AC

## 2023-07-06 MED ORDER — SUCCINYLCHOLINE CHLORIDE 200 MG/10ML IV SOSY
PREFILLED_SYRINGE | INTRAVENOUS | Status: AC
  Filled 2023-07-06: qty 10

## 2023-07-06 MED ORDER — ALBUMIN HUMAN 5 % IV SOLN
INTRAVENOUS | Status: DC | PRN
Start: 2023-07-06 — End: 2023-07-06

## 2023-07-06 MED ORDER — FENTANYL CITRATE (PF) 100 MCG/2ML IJ SOLN
INTRAMUSCULAR | Status: AC
  Filled 2023-07-06: qty 2

## 2023-07-06 MED ORDER — MIDAZOLAM HCL 2 MG/2ML IJ SOLN
INTRAMUSCULAR | Status: DC | PRN
  Administered 2023-07-06: 2 mg via INTRAVENOUS

## 2023-07-06 MED ORDER — FENTANYL CITRATE (PF) 100 MCG/2ML IJ SOLN
25.0000 ug | INTRAMUSCULAR | Status: DC | PRN
  Administered 2023-07-06 (×2): 50 ug via INTRAVENOUS

## 2023-07-06 MED ORDER — LIDOCAINE 2% (20 MG/ML) 5 ML SYRINGE
INTRAMUSCULAR | Status: AC
  Filled 2023-07-06: qty 5

## 2023-07-06 MED ORDER — SUGAMMADEX SODIUM 200 MG/2ML IV SOLN
INTRAVENOUS | Status: DC | PRN
  Administered 2023-07-06: 400 mg via INTRAVENOUS

## 2023-07-06 MED ORDER — AMISULPRIDE (ANTIEMETIC) 5 MG/2ML IV SOLN: 10.0000 mg | Freq: Once | INTRAVENOUS | Status: DC | PRN

## 2023-07-06 MED ORDER — MIDAZOLAM HCL 2 MG/2ML IJ SOLN
INTRAMUSCULAR | Status: AC
  Filled 2023-07-06: qty 2

## 2023-07-06 MED ORDER — OXYCODONE HCL 5 MG PO TABS: 5.0000 mg | ORAL_TABLET | Freq: Once | ORAL | Status: DC | PRN

## 2023-07-06 MED ORDER — KETOROLAC TROMETHAMINE 30 MG/ML IJ SOLN
30.0000 mg | Freq: Four times a day (QID) | INTRAMUSCULAR | Status: DC
  Filled 2023-07-06: qty 1

## 2023-07-06 MED ORDER — MENTHOL 3 MG MT LOZG: 1.0000 | LOZENGE | OROMUCOSAL | Status: DC | PRN

## 2023-07-06 MED ORDER — DIBUCAINE (PERIANAL) 1 % EX OINT: 1.0000 | TOPICAL_OINTMENT | CUTANEOUS | Status: DC | PRN

## 2023-07-06 MED ORDER — ACETAMINOPHEN 10 MG/ML IV SOLN: 1000.0000 mg | Freq: Once | INTRAVENOUS | Status: DC | PRN

## 2023-07-06 MED ORDER — SODIUM CHLORIDE 0.9 % IV SOLN: INTRAVENOUS | Status: DC | PRN

## 2023-07-06 MED ORDER — 0.9 % SODIUM CHLORIDE (POUR BTL) OPTIME
TOPICAL | Status: DC | PRN
Start: 2023-07-06 — End: 2023-07-06
  Administered 2023-07-06 (×2): 1000 mL

## 2023-07-06 MED ORDER — LIDOCAINE 2% (20 MG/ML) 5 ML SYRINGE
INTRAMUSCULAR | Status: DC | PRN
  Administered 2023-07-06: 80 mg via INTRAVENOUS
  Administered 2023-07-06: 20 mg via INTRAVENOUS

## 2023-07-06 MED ORDER — DIPHENHYDRAMINE HCL 25 MG PO CAPS: 25.0000 mg | ORAL_CAPSULE | Freq: Four times a day (QID) | ORAL | Status: DC | PRN

## 2023-07-06 MED ORDER — PHENYLEPHRINE 80 MCG/ML (10ML) SYRINGE FOR IV PUSH (FOR BLOOD PRESSURE SUPPORT)
PREFILLED_SYRINGE | INTRAVENOUS | Status: DC | PRN
  Administered 2023-07-06: 160 ug via INTRAVENOUS

## 2023-07-06 MED ORDER — KETOROLAC TROMETHAMINE 30 MG/ML IJ SOLN
30.0000 mg | Freq: Four times a day (QID) | INTRAMUSCULAR | Status: DC
  Administered 2023-07-06 – 2023-07-09 (×12): 30 mg via INTRAVENOUS
  Filled 2023-07-06 (×13): qty 1

## 2023-07-06 MED ORDER — FENTANYL CITRATE (PF) 250 MCG/5ML IJ SOLN
INTRAMUSCULAR | Status: AC
  Filled 2023-07-06: qty 5

## 2023-07-06 MED ORDER — ONDANSETRON HCL 4 MG/2ML IJ SOLN: 4.0000 mg | Freq: Four times a day (QID) | INTRAMUSCULAR | Status: DC | PRN

## 2023-07-06 MED ORDER — ROCURONIUM BROMIDE 10 MG/ML (PF) SYRINGE
PREFILLED_SYRINGE | INTRAVENOUS | Status: AC
  Filled 2023-07-06: qty 10

## 2023-07-06 MED ORDER — ACETAMINOPHEN 10 MG/ML IV SOLN
INTRAVENOUS | Status: AC
  Filled 2023-07-06: qty 100

## 2023-07-06 MED ORDER — SCOPOLAMINE 1 MG/3DAYS TD PT72
MEDICATED_PATCH | TRANSDERMAL | Status: AC
  Filled 2023-07-06: qty 1

## 2023-07-06 MED ORDER — SIMETHICONE 80 MG PO CHEW
80.0000 mg | CHEWABLE_TABLET | Freq: Three times a day (TID) | ORAL | Status: DC
  Filled 2023-07-06 (×2): qty 1

## 2023-07-06 MED ORDER — LACTATED RINGERS IV SOLN: INTRAVENOUS | Status: DC

## 2023-07-06 MED ORDER — SIMETHICONE 80 MG PO CHEW: 80.0000 mg | CHEWABLE_TABLET | ORAL | Status: DC | PRN

## 2023-07-06 MED ORDER — DIPHENHYDRAMINE HCL 12.5 MG/5ML PO ELIX: 12.5000 mg | ORAL_SOLUTION | Freq: Four times a day (QID) | ORAL | Status: DC | PRN

## 2023-07-06 MED ORDER — PHENYLEPHRINE 80 MCG/ML (10ML) SYRINGE FOR IV PUSH (FOR BLOOD PRESSURE SUPPORT)
PREFILLED_SYRINGE | INTRAVENOUS | Status: AC
  Filled 2023-07-06: qty 10

## 2023-07-06 MED ORDER — KETOROLAC TROMETHAMINE 30 MG/ML IJ SOLN
INTRAMUSCULAR | Status: AC
  Filled 2023-07-06: qty 1

## 2023-07-06 MED ORDER — HEPARIN SODIUM (PORCINE) 5000 UNIT/ML IJ SOLN: 5000.0000 [IU] | Freq: Three times a day (TID) | INTRAMUSCULAR | Status: DC

## 2023-07-06 MED ORDER — NALOXONE HCL 0.4 MG/ML IJ SOLN: 0.4000 mg | INTRAMUSCULAR | Status: DC | PRN

## 2023-07-06 MED ORDER — ZOLPIDEM TARTRATE 5 MG PO TABS: 5.0000 mg | ORAL_TABLET | Freq: Every evening | ORAL | Status: DC | PRN

## 2023-07-06 MED ORDER — WITCH HAZEL-GLYCERIN EX PADS: 1.0000 | MEDICATED_PAD | CUTANEOUS | Status: DC | PRN

## 2023-07-06 MED ORDER — DEXAMETHASONE SODIUM PHOSPHATE 10 MG/ML IJ SOLN
INTRAMUSCULAR | Status: AC
  Filled 2023-07-06: qty 1

## 2023-07-06 SURGICAL SUPPLY — 65 items
APL SKNCLS STERI-STRIP NONHPOA (GAUZE/BANDAGES/DRESSINGS) ×1
BAG COUNTER SPONGE SURGICOUNT (BAG) ×2 IMPLANT
BAG SPNG CNTER NS LX DISP (BAG) ×1
BARRIER ADHS 3X4 INTERCEED (GAUZE/BANDAGES/DRESSINGS) IMPLANT
BENZOIN TINCTURE PRP APPL 2/3 (GAUZE/BANDAGES/DRESSINGS) IMPLANT
BLADE SURG 10 STRL SS (BLADE) ×4 IMPLANT
BRR ADH 4X3 ABS CNTRL BYND (GAUZE/BANDAGES/DRESSINGS)
CANISTER SUCT 3000ML PPV (MISCELLANEOUS) ×2 IMPLANT
DRAPE LAPAROSCOPIC ABDOMINAL (DRAPES) IMPLANT
DRAPE WARM FLUID 44X44 (DRAPES) IMPLANT
DRSG OPSITE POSTOP 4X10 (GAUZE/BANDAGES/DRESSINGS) IMPLANT
DRSG OPSITE POSTOP 4X12 (GAUZE/BANDAGES/DRESSINGS) IMPLANT
DURAPREP 26ML APPLICATOR (WOUND CARE) ×4 IMPLANT
GAUZE 4X4 16PLY ~~LOC~~+RFID DBL (SPONGE) IMPLANT
GLOVE BIO SURGEON STRL SZ7.5 (GLOVE) ×2 IMPLANT
GLOVE BIOGEL PI IND STRL 7.0 (GLOVE) ×2 IMPLANT
GLOVE BIOGEL PI IND STRL 7.5 (GLOVE) ×2 IMPLANT
GOWN STRL REUS W/ TWL LRG LVL3 (GOWN DISPOSABLE) ×4 IMPLANT
GOWN STRL REUS W/TWL LRG LVL3 (GOWN DISPOSABLE) ×3
HANDLE SUCTION POOLE (INSTRUMENTS) IMPLANT
HEMOSTAT SURGICEL 4X8 (HEMOSTASIS) IMPLANT
HIBICLENS CHG 4% 4OZ BTL (MISCELLANEOUS) ×2 IMPLANT
KIT BASIN OR (CUSTOM PROCEDURE TRAY) IMPLANT
KIT PINK PAD W/HEAD ARE REST (MISCELLANEOUS) IMPLANT
KIT PINK PAD W/HEAD ARM REST (MISCELLANEOUS) ×2 IMPLANT
KIT TURNOVER KIT B (KITS) ×2 IMPLANT
LIGASURE IMPACT 36 18CM CVD LR (INSTRUMENTS) IMPLANT
NDL HYPO 22X1.5 SAFETY MO (MISCELLANEOUS) IMPLANT
NEEDLE HYPO 22X1.5 SAFETY MO (MISCELLANEOUS) IMPLANT
NS IRRIG 1000ML POUR BTL (IV SOLUTION) ×2 IMPLANT
PACK ABDOMINAL GYN (CUSTOM PROCEDURE TRAY) ×2 IMPLANT
PAD OB MATERNITY 4.3X12.25 (PERSONAL CARE ITEMS) ×2 IMPLANT
PENCIL BUTTON HOLSTER BLD 10FT (ELECTRODE) ×2 IMPLANT
PROTECTOR NERVE ULNAR (MISCELLANEOUS) ×2 IMPLANT
RELOAD PROXIMATE 75MM BLUE (ENDOMECHANICALS) ×3 IMPLANT
RELOAD STAPLE 75 3.8 BLU REG (ENDOMECHANICALS) IMPLANT
RTRCTR C-SECT PINK 25CM LRG (MISCELLANEOUS) IMPLANT
SPIKE FLUID TRANSFER (MISCELLANEOUS) IMPLANT
SPONGE INTESTINAL PEANUT (DISPOSABLE) IMPLANT
SPONGE T-LAP 18X18 ~~LOC~~+RFID (SPONGE) ×2 IMPLANT
STAPLER GUN LINEAR PROX 60 (STAPLE) IMPLANT
STAPLER PROXIMATE 75MM BLUE (STAPLE) IMPLANT
STAPLER VISISTAT 35W (STAPLE) IMPLANT
STRIP CLOSURE SKIN 1/2X4 (GAUZE/BANDAGES/DRESSINGS) IMPLANT
SUCTION POOLE HANDLE (INSTRUMENTS) ×1
SUT CHROMIC 2 0 CT 1 (SUTURE) ×2 IMPLANT
SUT CHROMIC 2 0 SH (SUTURE) IMPLANT
SUT MNCRL AB 3-0 PS2 27 (SUTURE) ×2 IMPLANT
SUT MNCRL AB 4-0 PS2 18 (SUTURE) IMPLANT
SUT MON AB 3-0 SH 27 (SUTURE)
SUT MON AB 3-0 SH27 (SUTURE) IMPLANT
SUT PDS AB 1 CT1 36 (SUTURE) IMPLANT
SUT PDS AB 1 CTX 36 (SUTURE) IMPLANT
SUT PLAIN 2 0 XLH (SUTURE) ×2 IMPLANT
SUT SILK 2 0SH CR/8 30 (SUTURE) IMPLANT
SUT SILK 3 0SH CR/8 30 (SUTURE) IMPLANT
SUT VIC AB 0 CT1 18XCR BRD8 (SUTURE) IMPLANT
SUT VIC AB 0 CT1 27 (SUTURE) ×1
SUT VIC AB 0 CT1 27XBRD ANBCTR (SUTURE) ×8 IMPLANT
SUT VIC AB 0 CT1 8-18 (SUTURE)
SUT VICRYL 0 TIES 12 18 (SUTURE) ×2 IMPLANT
SYR CONTROL 10ML LL (SYRINGE) IMPLANT
TOWEL GREEN STERILE FF (TOWEL DISPOSABLE) ×4 IMPLANT
TRAY FOLEY W/BAG SLVR 14FR (SET/KITS/TRAYS/PACK) ×2 IMPLANT
YANKAUER SUCT BULB TIP NO VENT (SUCTIONS) IMPLANT

## 2023-07-06 NOTE — Progress Notes (Signed)
Patient's family would like for patient to be transferred to [facility] for further care. Su Hilt MD notified and Nicoletta Dress B notified as well.

## 2023-07-06 NOTE — Progress Notes (Addendum)
Called by RN d/t pt vomiting around NG Tube.  I contacted on call provider, Dr. Bedelia Person, for general surgery and notified them of the recent events and asked about surgical intervention.  They said they would send someone to come by to reposition the NG tube and that the study she had this evening was diagnostic and therapeutic and the pt did not need surgical intervention at this time.

## 2023-07-06 NOTE — Op Note (Signed)
06/29/2023 - 07/06/2023  9:21 AM  PATIENT:  Erika Lewis  37 y.o. female  Patient Care Team: Nigel Bridgeman, CNM as PCP - General (Obstetrics and Gynecology)  PRE-OPERATIVE DIAGNOSIS: Ileus vs partial small bowel obstruction  POST-OPERATIVE DIAGNOSIS:  Ischemic wall of cecum without perforation or abscess  PROCEDURE:  Exploratory laparotomy with right hemicolectomy  SURGEON:  Marin Olp, MD  ASSISTANT: Osborn Coho, MD  ANESTHESIA:   general  COUNTS:  Sponge, needle and instrument counts were reported correct x2 at the conclusion of the operation.  EBL: 20 mL  DRAINS: None  SPECIMEN: Right colon (including terminal ileum, cecum + appendix, ascending colon)  COMPLICATIONS: none  FINDINGS: Intraoperative consult.  Highly mobile cecum and ascending colon without volvulus.  This was able to be brought out by Dr. Su Hilt through her Pfannenstiel incision. Ischemia with fibrinous exudate coating the inferior/lateral wall of the cecum on the dependent side. No evidence of perforation. Dilation of much of her proximal colon and transverse colon.  No palpable masses at the level of the splenic flexure, throughout her descending colon, sigmoid colon or within the pelvis.  Small bowel and stomach are dilated but completely viable.  There is no adhesive disease in her abdomen.  There is no small bowel obstruction. There is no large bowel obstruction. There is no evidence of prior cecal volvus with mesenteric contusion or ascending colon wall contusion. Constellation suspicious for 'reactive ileus' 2/2 above. Very unusual case, etiology is not clearly identified. No evidence of peritonitis or gross contamination/perforation, therefore proceeded with right hemicolectomy with ileocolic anastomosis   DISPOSITION: PACU in satisfactory condition  INDICATION: This is a very pleasant 36yo female followed by our service. We were consulted for ileus vs partial small bowel obstruction in  post-partum period following C-section for twins. She had CT scan demonstrating ileus vs partial SBO without evidence of volvulus or bowel ischemia. Some air under wall of small bowel which was suspected from distention. She was also diagnosed 10/3 with acute rhinovirus and pneumonia vs aspiration pneumonitis. She had an NG tube placed and was undergoing SBO 'protocol' with contrast and XR. She clinically had improved 10/4 and was feeling much better. Her abdominal discomfort had greatly improved/resolved. She was passing flatus and having bowel movements. By report, she had nausea/vomiting overnight 10/4 - 10/5 and Dr. Su Hilt had discussed with surgery on call. Dr. Su Hilt took her to OR for exploration.  I am on-call for our practice this morning and was consulted intraoperatively for evaluation.  DESCRIPTION: She was in the operating room having had her Pfannenstiel incision reopened when I arrived with Dr. Su Hilt.  I scrubbed in.  She had received perioperative antibiotics.  Murky peritoneal fluid present.  The cecum is visible within Alexis wound protector and the Pfannenstiel and appears quite mobile.  The lateral wall of the cecum and proximal ascending colon have patchy ischemia which appears full-thickness with thinning of the colon wall.  There is no evident perforation.  This area is covered in fibrinous exudate.  I am able to peel some this off to appreciate the thin wall of the cecum.  This does not appear at all to be viable and is at risk for perforation.  The area is too large for any sort of "repair."  I felt that the best option in this case would be for a right hemicolectomy to remove this segment of the colon which is both mobile and with portions that are not viable.  We would not  be able to mobilize the hepatic flexure/proximal ascending colon through this Pfannenstiel and therefore the decision was made to proceed with a periumbilical midline laparotomy.  An incision was made with a  scalpel and dissection continued to the subcutaneous tissue with electrocautery.  The fascia incised the midline.  The peritoneum is entered cautiously.  An Alexis wound protector was carefully placed as well as a Social research officer, government.  Were able to bring the cecum and proximal ascending colon up into this freely.  There are no significant terminal ileal attachments in her right lower quadrant.  The appendix is normal in appearance.  We first began by running the small intestine.  This was done from the ileocecal valve back to the ligament of Treitz x 2.  The bowel was completely viable and although somewhat dilated it is pink in color. There is no evidence of small bowel ischemia.  The stomach is pink in color.  The liver and gallbladder appear normal.  There is a large postpartum uterus in the pelvis.  The ascending colon is evaluated and palpated and although dilated the proximal portion of this is normal in appearance.  The transverse colon is quite enlarged as well full of gas and liquid stool but no palpable masses.  The splenic flexure was able to be palpated and no palpable masses were noted.  The descending colon was palpated and also somewhat dilated but pink in color.  The sigmoid colon is palpated and also somewhat dilated but pink in color.  All this is clearly viable.  There are no palpable masses.  I am able to feel behind her uterus into the pelvis and the rectum feels normal without a palpable mass.  We then therefore turned our attention to a right hemicolectomy to remove the mobile cecum and proximal ascending colon.  The etiology of this ischemia is not completely clear at present.  It is on the lateral wall.  Her cecum and ascending as well as transverse colon are quite dilated in order to facilitate exposure of her hepatic flexure, we felt it would be necessary to at least partially decompress her colon.  A wound protector was then placed and towels were placed around the field.  Therefore, a 3-0 silk  suture was used to create a pursestring type stitch at the cecum and a small colotomy was created.  The Poole suction was inserted into the colon we evacuated approximately 2 L of liquid stool as well as a fair amount of gas.  In doing so, her colon is much improved and caliber and it is much easier to see anatomy in her right upper quadrant.  The pursestring was then tied.  Gloves were then changed and all contaminated equipment was passed off.  The mid ascending colon and hepatic flexure were then mobilized by incising the Nehemias Sauceda line of Toldt and in doing so were able to deliver the hepatic flexure up to the midline.  The hepatic flexure again is healthy in appearance.  The duodenum was identified and is well away from the ascending mesocolon and has been able to be mobilized/swept "down."  The terminal ileum was identified.  A window was created the mesentery and the terminal ileum was then divided using a GIA 75 blue load stapler.  A Babcock was placed on the proximal staple line to assist in maintaining orientation.  The distal point of transection is identified on the proximal ascending colon again where it is clearly healthy in appearance without any sort of ischemia  or venous thromboses.  Wound is created mesentery at this level and this is also divided with a GIA 75 blue load stapler.  Staple lines were then inspected and noted to have well-formed staples and also hemostatic in nature.  The intervening mesentery is then ligated and divided using a LigaSure taking this right along the wall of the colon to avoid injuring any blood supply to the remaining colon.  This was passed off as specimen.  Attention was then turned to creating a ileocolic anastomosis.  Both ends are clearly healthy and viable in appearance.  Both segments have a palpable pulse in the mesentery going out to the respective staple lines.  Clean towels were placed around the field.  Orientation is confirmed such a there is no twisting of  the small bowel or colon and that the bowel lates in its normal anatomic configuration.  The enterotomy and colotomy was created and a 75 mm GIA blue stapler was used to create an ileocolic anastomosis along the tenia of the colon and the antimesenteric border of the distal ileum.  Stapler was closed, held, and fired.  Staple lines inspected noted to be both hemostatic with well-formed staples.  Allis clamps were then used to occlude the common enterotomy and this is distracted off of the anastomotic staple lines.  A TA 60 blue load stapler was then used to close the ileocolic common enterotomy.  The staple line is then palpated noted to be widely patent, 4 fingerbreadths in diameter at the anastomosis.  The corners of the TA staple line are then "dunked" using 3-0 silk sutures.  The apex is secured with a 3-0 silk stitch. The staple line is inspected and noted to have well-formed staples and hemostatic.  The bowel was returned to its anatomic configuration.  The abdomen is then copiously irrigated with warm saline until the effluent was clear.  The wound protector was removed and then clean gown/gloves utilized.  The midline fascia the laparotomy site is closed using two running #1 PDS sutures.  The closure was then palpated and noted to be complete without any gaps.  The wound is then irrigated again with sterile saline.  Hemostasis is verified.  The skin is then approximated with staples.  I then assisted Dr. Su Hilt with closure of the Pfannenstiel site.  All counts were reported correct x 2.  Please refer to her notes for details.

## 2023-07-06 NOTE — Plan of Care (Signed)
  Problem: Education: Goal: Knowledge of General Education information will improve Description: Including pain rating scale, medication(s)/side effects and non-pharmacologic comfort measures Outcome: Progressing   Problem: Health Behavior/Discharge Planning: Goal: Ability to manage health-related needs will improve Outcome: Progressing   Problem: Clinical Measurements: Goal: Ability to maintain clinical measurements within normal limits will improve Outcome: Progressing Goal: Will remain free from infection Outcome: Progressing Goal: Diagnostic test results will improve Outcome: Progressing Goal: Respiratory complications will improve Outcome: Progressing Goal: Cardiovascular complication will be avoided Outcome: Progressing   Problem: Elimination: Goal: Will not experience complications related to bowel motility Outcome: Progressing Goal: Will not experience complications related to urinary retention Outcome: Progressing   Problem: Pain Managment: Goal: General experience of comfort will improve Outcome: Progressing   Problem: Skin Integrity: Goal: Risk for impaired skin integrity will decrease Outcome: Progressing   Problem: Education: Goal: Knowledge of the prescribed therapeutic regimen will improve Outcome: Progressing Goal: Understanding of sexual limitations or changes related to disease process or condition will improve Outcome: Progressing Goal: Individualized Educational Video(s) Outcome: Progressing   Problem: Education: Goal: Knowledge of condition will improve Outcome: Progressing Goal: Individualized Educational Video(s) Outcome: Progressing Goal: Individualized Newborn Educational Video(s) Outcome: Progressing   Problem: Education: Goal: Ability to describe self-care measures that may prevent or decrease complications (Diabetes Survival Skills Education) will improve Outcome: Progressing Goal: Individualized Educational Video(s) Outcome:  Progressing   Problem: Coping: Goal: Ability to adjust to condition or change in health will improve Outcome: Progressing   Problem: Fluid Volume: Goal: Ability to maintain a balanced intake and output will improve Outcome: Progressing   Problem: Health Behavior/Discharge Planning: Goal: Ability to identify and utilize available resources and services will improve Outcome: Progressing Goal: Ability to manage health-related needs will improve Outcome: Progressing   Problem: Metabolic: Goal: Ability to maintain appropriate glucose levels will improve Outcome: Progressing   Problem: Nutritional: Goal: Maintenance of adequate nutrition will improve Outcome: Progressing Goal: Progress toward achieving an optimal weight will improve Outcome: Progressing   Problem: Skin Integrity: Goal: Risk for impaired skin integrity will decrease Outcome: Progressing   Problem: Tissue Perfusion: Goal: Adequacy of tissue perfusion will improve Outcome: Progressing   Problem: Fluid Volume: Goal: Hemodynamic stability will improve Outcome: Progressing   Problem: Clinical Measurements: Goal: Diagnostic test results will improve Outcome: Progressing Goal: Signs and symptoms of infection will decrease Outcome: Progressing   Problem: Respiratory: Goal: Ability to maintain adequate ventilation will improve Outcome: Progressing   Problem: Education: Goal: Knowledge of disease or condition will improve Outcome: Progressing Goal: Knowledge of the prescribed therapeutic regimen will improve Outcome: Progressing   Problem: Fluid Volume: Goal: Peripheral tissue perfusion will improve Outcome: Progressing   Problem: Clinical Measurements: Goal: Complications related to disease process, condition or treatment will be avoided or minimized Outcome: Progressing

## 2023-07-06 NOTE — Progress Notes (Signed)
  Echocardiogram 2D Echocardiogram has been performed.  [name] 07/06/2023, 3:47 PM

## 2023-07-06 NOTE — Progress Notes (Signed)
Subjective: POD# 7  Called to bedside to assess after emesis with NG tube in place. Per RN, 600 ml of greenish-brown, malodorous liquid.  Reports feeling tired and sore from vomiting Denies SOB and nausea currently and has consumed sips of water only Flatus not passing, reports several bowel movements on 10/4, but cannot recall consistency or color of stool Vaginal bleeding is scant, no clots     Objective:  VS:  Vitals:   07/05/23 1957 07/05/23 2141 07/06/23 0047 07/06/23 0504  BP: (!) 157/83  (!) 158/84 (!) 154/85  Pulse: (!) 128  (!) 108 100  Resp: 16  18 18   Temp: 98 F (36.7 C)  98.6 F (37 C) 98.6 F (37 C)  TempSrc: Oral  Oral Oral  SpO2: 97% 96% 93% 99%  Weight:      Height:        Intake/Output Summary (Last 24 hours) at 07/06/2023 0536 Last data filed at 07/06/2023 0533 Gross per 24 hour  Intake 1841.21 ml  Output 4250 ml  Net -2408.79 ml     Recent Labs    07/05/23 0203 07/06/23 0456  WBC 8.1 11.4*  HGB 9.4* 9.4*  HCT 29.9* 30.7*  PLT 266 300    Physical Exam:  General: alert, cooperative, and no distress CV: Regular rate and rhythm or without murmur or extra heart sounds Resp: clear Abdomen: firm, grossly distended, absent bowel sounds, tympany in upper quadrants, edema to lower abd Incision:honeycomb dressing clean, dry, and intact Perineum:  Uterine Fundus: firm, below umbilicus, unable to palpate secondary to distention Lochia: minimal Ext: 1+ edema, negative for tenderness, pain, and cords   Assessment/Plan: 37 y.o.   POD# 7. [id number]                  Ileus vs. SBO Chronic hypertension affecting pregnancy S/P cesarean section Rhinovirus Care co-ordinated with general surgery by Dr. Evorn Gong, DNP, CNM 07/06/2023, 5:36 AM

## 2023-07-06 NOTE — Anesthesia Preprocedure Evaluation (Addendum)
Anesthesia Evaluation  Patient identified by MRN, date of birth, ID band Patient awake    Reviewed: Allergy & Precautions, NPO status , Patient's Chart, lab work & pertinent test results  Airway Mallampati: III  TM Distance: >3 FB Neck ROM: Full    Dental no notable dental hx.    Pulmonary asthma    Pulmonary exam normal        Cardiovascular hypertension, Normal cardiovascular exam     Neuro/Psych    GI/Hepatic   Endo/Other  diabetes, Insulin Dependent  Morbid obesityHYPERnatremia hypokalemia  Renal/GU      Musculoskeletal   Abdominal  (+) + obese  Peds  Hematology  (+) Blood dyscrasia, Sickle cell trait and anemia   Anesthesia Other Findings SMALL BOWEL OBSTRUCTION  Reproductive/Obstetrics                             Anesthesia Physical Anesthesia Plan  ASA: 3 and emergent  Anesthesia Plan: General   Post-op Pain Management:    Induction: Intravenous and Rapid sequence  PONV Risk Score and Plan: 3 and Ondansetron, Dexamethasone, Midazolam and Treatment may vary due to age or medical condition  Airway Management Planned: Oral ETT  Additional Equipment:   Intra-op Plan:   Post-operative Plan: Extubation in OR  Informed Consent: I have reviewed the patients History and Physical, chart, labs and discussed the procedure including the risks, benefits and alternatives for the proposed anesthesia with the patient or authorized representative who has indicated his/her understanding and acceptance.     Dental advisory given  Plan Discussed with: CRNA  Anesthesia Plan Comments: (NG tube in place to right nares)       Anesthesia Quick Evaluation

## 2023-07-06 NOTE — Anesthesia Postprocedure Evaluation (Signed)
Anesthesia Post Note  Patient: Erika Lewis  Procedure(s) Performed: EXPLORATION LAPAROTOMY, RIGHT HEMICOLECTOMY (Abdomen)     Patient location during evaluation: PACU Anesthesia Type: General Level of consciousness: awake and alert Pain management: pain level controlled Vital Signs Assessment: post-procedure vital signs reviewed and stable Respiratory status: spontaneous breathing, nonlabored ventilation, respiratory function stable and patient connected to nasal cannula oxygen Cardiovascular status: blood pressure returned to baseline and stable Postop Assessment: no apparent nausea or vomiting Anesthetic complications: no  No notable events documented.  Last Vitals:  Vitals:   07/06/23 1015 07/06/23 1030  BP: (!) 148/80 (!) 158/84  Pulse: (!) 107 (!) 107  Resp: 16 (!) 23  Temp:    SpO2: 96% 96%    Last Pain:  Vitals:   07/06/23 1015  TempSrc:   PainSc: 8                  Anishka Bushard,W. EDMOND

## 2023-07-06 NOTE — Anesthesia Procedure Notes (Signed)
Procedure Name: Intubation Date/Time: 07/06/2023 7:09 AM  Performed by: Laruth Bouchard., CRNAPre-anesthesia Checklist: Patient identified, Emergency Drugs available, Suction available, Patient being monitored and Timeout performed Patient Re-evaluated:Patient Re-evaluated prior to induction Oxygen Delivery Method: Circle system utilized Preoxygenation: Pre-oxygenation with 100% oxygen Induction Type: IV induction, Rapid sequence and Cricoid Pressure applied Laryngoscope Size: Mac and 4 Grade View: Grade I Tube type: Oral Tube size: 7.0 mm Number of attempts: 1 Airway Equipment and Method: Video-laryngoscopy and Stylet Placement Confirmation: ETT inserted through vocal cords under direct vision, positive ETCO2 and breath sounds checked- equal and bilateral Secured at: 23 cm Tube secured with: Tape Dental Injury: Teeth and Oropharynx as per pre-operative assessment  Comments: DVLx1 with MAC4 with no view, easy mask ventilation until DVL with glidescope 4. No adverse events and VSS.

## 2023-07-06 NOTE — Progress Notes (Signed)
PROGRESS NOTE    Erika Lewis  LKG:[id number] DOB: 07/14/1986 DOA: 06/29/2023 PCP: Nigel Bridgeman, CNM   Brief Narrative: 37 year old with past medical history significant for diabetes type 2, hypertension, asthma, sickle cell trait underwent C-section and salpingectomy on 9/28.  Postoperative patient had postpartum hemorrhage.  She was transfused and received IV iron.  She developed abdominal distention, nausea and vomiting.  CT abdomen and pelvis showed marked fluid distention of the stomach and distended small bowel loop.  Extensive bilateral consolidation in the lungs.  Negative for fascial dehiscence.  Hospitalist consulted for management of pneumonia and medical problems. Post surgery patient develop SBO versus ileus, pneumonia, also found to have RSV.   Assessment & Plan:   Principal Problem:   Chronic hypertension affecting pregnancy Active Problems:   S/P cesarean section   Acute postoperative anemia due to greater than expected blood loss   Postpartum hemorrhage   Rhinovirus   RSV (respiratory syncytial virus pneumonia)  Thank you for consult, will continue to follow with you  1-Pneumonia: Aspiration and/or  RSV pneumonia Support  care for RSV Follow blood cultures: No growth to date Lactic acid negative Continue with Cefepime and Flagyl.  Oxygen sat stable on 2 L.   SBO, Ischemic wall of cecum without perforation or abscess.  Underwent S/P exploratory laparotomy with right hemicolectomy 10/05. Surgery Following NG tube placed on 10/03 yielding 900 cc Management per Sx.  She hd BM 10/04 Replete electrolytes.   Hypokalemia: Replete IV. Check Mg level.   Hypoalbuminemia She will need supplement when tolerating oral. .   AKI Foley catheter in place Improved With hydration  Mild Pulmonary congestion Cardiomegaly w/ mild congestion noted on CXR.  IV Lasix x 1 given.  ECHO ordered.  BNP 31.  DM type 2: Continue with SSI  Chronic HTN, affecting  pregnancy PRN Hydralazine, labetalol.  On nifedipine, resume when tolerating oral.   Asthma: Pulmicort. PRN albuterol.   Post Partum Twins. PPH S/P C section and PPH/  Per GYN OB Hb stable.      Estimated body mass index is 43.62 kg/m as calculated from the following:   Height as of this encounter: 5\' 4"  (1.626 m).   Weight as of this encounter: 115.3 kg.   DVT prophylaxis: SCD Code Status: Full code Family Communication: Care discussed with patient Disposition Plan:  Status is: Inpatient Remains inpatient appropriate because: management of PNA   Antimicrobials:    Subjective: She just came from Sx. She is sleepy  Family at bedside.   Objective: Vitals:   07/06/23 1015 07/06/23 1030 07/06/23 1100 07/06/23 1128  BP: (!) 148/80 (!) 158/84 (!) 154/75   Pulse: (!) 107 (!) 107 (!) 108   Resp: 16 (!) 23 19   Temp:  99.6 F (37.6 C) 98.4 F (36.9 C)   TempSrc:   Oral   SpO2: 96% 96%  98%  Weight:      Height:        Intake/Output Summary (Last 24 hours) at 07/06/2023 1220 Last data filed at 07/06/2023 0950 Gross per 24 hour  Intake 2003.03 ml  Output 2800 ml  Net -796.97 ml   Filed Weights   06/29/23 0940  Weight: 115.3 kg    Examination:  General exam: NAD Respiratory system: CTA. Cardiovascular system: S 1, S 2 RRR Gastrointestinal system: BS decreased, midline incision with staples, honeycomb dressing.  Central nervous system: Sleepy  Extremities: Symmetric 5 x 5 power.    Data Reviewed: I have personally  reviewed following labs and imaging studies  CBC: Recent Labs  Lab 06/30/23 0505 07/03/23 0610 07/04/23 2020 07/05/23 0203 07/06/23 0456  WBC 11.2* 14.2* 10.2 8.1 11.4*  NEUTROABS  --  12.6* 8.2* 6.6 9.7*  HGB 7.9* 10.4* 9.1* 9.4* 9.4*  HCT 25.4* 33.1* 28.8* 29.9* 30.7*  MCV 78.2* 77.9* 79.6* 77.3* 78.9*  PLT 127* 209 340 266 300   Basic Metabolic Panel: Recent Labs  Lab 06/30/23 0505 07/01/23 0813 07/02/23 0507 07/04/23 1101  07/04/23 2020 07/05/23 0203 07/06/23 0456  NA  --   --   --  139 142 144 148*  K  --   --   --  3.3* 3.1* 3.0* 2.9*  CL  --   --   --  99 100 101 107  CO2  --   --   --  26 24 25 28   GLUCOSE  --    < > 106* 126* 144* 149* 126*  BUN  --   --   --  17 18 20 19   CREATININE 0.89  --   --  1.20* 1.14* 1.12* 0.80  CALCIUM  --   --   --  8.1* 8.2* 8.1* 8.2*   < > = values in this interval not displayed.   GFR: Estimated Creatinine Clearance: 121.1 mL/min (by C-G formula based on SCr of 0.8 mg/dL). Liver Function Tests: Recent Labs  Lab 07/04/23 1101 07/04/23 2020 07/05/23 0203 07/06/23 0456  AST 21 12* 12* 15  ALT 15 17 15 13   ALKPHOS 69 73 58 65  BILITOT 0.8 0.9 0.1* 0.6  PROT 5.3* 5.6* 5.0* 5.4*  ALBUMIN 1.9* 1.9* 1.7* 1.8*   No results for input(s): "LIPASE", "AMYLASE" in the last 168 hours. No results for input(s): "AMMONIA" in the last 168 hours. Coagulation Profile: Recent Labs  Lab 07/04/23 2020  INR 1.2   Cardiac Enzymes: No results for input(s): "CKTOTAL", "CKMB", "CKMBINDEX", "TROPONINI" in the last 168 hours. BNP (last 3 results) No results for input(s): "PROBNP" in the last 8760 hours. HbA1C: Recent Labs    07/04/23 1217  HGBA1C 5.4   CBG: Recent Labs  Lab 07/05/23 2101 07/06/23 0049 07/06/23 0507 07/06/23 0949 07/06/23 1202  GLUCAP 128* 104* 123* 96 125*   Lipid Profile: No results for input(s): "CHOL", "HDL", "LDLCALC", "TRIG", "CHOLHDL", "LDLDIRECT" in the last 72 hours. Thyroid Function Tests: No results for input(s): "TSH", "T4TOTAL", "FREET4", "T3FREE", "THYROIDAB" in the last 72 hours. Anemia Panel: No results for input(s): "VITAMINB12", "FOLATE", "FERRITIN", "TIBC", "IRON", "RETICCTPCT" in the last 72 hours. Sepsis Labs: Recent Labs  Lab 07/04/23 2020 07/04/23 2226  LATICACIDVEN 1.9 1.5    Recent Results (from the past 240 hour(s))  Culture, OB Urine     Status: Abnormal   Collection Time: 07/03/23  5:57 AM   Specimen: Urine,  Random  Result Value Ref Range Status   Specimen Description URINE, RANDOM  Final   Special Requests NONE  Final   Culture (A)  Final    MULTIPLE SPECIES PRESENT, SUGGEST RECOLLECTION NO GROUP B STREP (S.AGALACTIAE) ISOLATED Performed at [facility], [address] [place], [place] [address]    Report Status 07/04/2023 FINAL  Final  Respiratory (~20 pathogens) panel by PCR     Status: Abnormal   Collection Time: 07/04/23  4:30 AM   Specimen: Nasopharyngeal Swab; Respiratory  Result Value Ref Range Status   Adenovirus NOT DETECTED NOT DETECTED Final   Coronavirus 229E NOT DETECTED NOT DETECTED Final  Comment: (NOTE) The Coronavirus on the Respiratory Panel, DOES NOT test for the novel  Coronavirus (2019 nCoV)    Coronavirus HKU1 NOT DETECTED NOT DETECTED Final   Coronavirus NL63 NOT DETECTED NOT DETECTED Final   Coronavirus OC43 NOT DETECTED NOT DETECTED Final   Metapneumovirus NOT DETECTED NOT DETECTED Final   Rhinovirus / Enterovirus DETECTED (A) NOT DETECTED Final   Influenza A NOT DETECTED NOT DETECTED Final   Influenza B NOT DETECTED NOT DETECTED Final   Parainfluenza Virus 1 NOT DETECTED NOT DETECTED Final   Parainfluenza Virus 2 NOT DETECTED NOT DETECTED Final   Parainfluenza Virus 3 NOT DETECTED NOT DETECTED Final   Parainfluenza Virus 4 NOT DETECTED NOT DETECTED Final   Respiratory Syncytial Virus NOT DETECTED NOT DETECTED Final   Bordetella pertussis NOT DETECTED NOT DETECTED Final   Bordetella Parapertussis NOT DETECTED NOT DETECTED Final   Chlamydophila pneumoniae NOT DETECTED NOT DETECTED Final   Mycoplasma pneumoniae NOT DETECTED NOT DETECTED Final    Comment: Performed at [facility], [address], [place], [place] [address]  Culture, blood (x 2)     Status: None (Preliminary result)   Collection Time: 07/04/23  8:20 PM   Specimen: BLOOD RIGHT ARM  Result Value Ref Range Status   Specimen Description BLOOD RIGHT ARM  Final   Special Requests    Final    BOTTLES DRAWN AEROBIC AND ANAEROBIC Blood Culture adequate volume   Culture   Final    NO GROWTH 2 DAYS Performed at [facility], [address], [place], [place] [address]    Report Status PENDING  Incomplete  Culture, blood (x 2)     Status: None (Preliminary result)   Collection Time: 07/04/23  8:21 PM   Specimen: BLOOD RIGHT HAND  Result Value Ref Range Status   Specimen Description BLOOD RIGHT HAND  Final   Special Requests   Final    BOTTLES DRAWN AEROBIC AND ANAEROBIC Blood Culture adequate volume   Culture   Final    NO GROWTH 2 DAYS Performed at [facility], [address] [place], [place] [address]    Report Status PENDING  Incomplete         Radiology Studies: PERIPHERAL VASCULAR CATHETERIZATION  Result Date: 07/06/2023 See surgical note for result.  DG Abd Portable 1V-Small Bowel Obstruction Protocol-initial, 8 hr delay  Result Date: 07/06/2023 CLINICAL DATA:  Small-bowel obstruction EXAM: PORTABLE ABDOMEN - 1 VIEW COMPARISON:  None Available. FINDINGS: Nasogastric tube overlies the mid body of the stomach. Administered probable enteric contrast opacifies the gastric fundus. Multiple dilated loops of bowel are again seen throughout the abdomen in keeping with a probable mid to distal small bowel obstruction. No gross free intraperitoneal gas. Bibasilar pulmonary infiltrates are again seen. IMPRESSION: 1. Persistent mid to distal small bowel obstruction. 2. Bibasilar pulmonary infiltrates. Electronically Signed   By: Helyn Numbers M.D.   On: 07/06/2023 00:37   CT ABDOMEN PELVIS W CONTRAST  Result Date: 07/04/2023 CLINICAL DATA:  Postop C-section with bowel dilatation EXAM: CT ABDOMEN AND PELVIS WITH CONTRAST TECHNIQUE: Multidetector CT imaging of the abdomen and pelvis was performed using the standard protocol following bolus administration of intravenous contrast. RADIATION DOSE REDUCTION: This exam was performed according to the departmental  dose-optimization program which includes automated exposure control, adjustment of the mA and/or kV according to patient size and/or use of iterative reconstruction technique. CONTRAST:  65mL OMNIPAQUE IOHEXOL 350 MG/ML SOLN COMPARISON:  Radiograph 07/04/2023  FINDINGS: Lower chest: Lung bases demonstrate bilateral lower lobe consolidations. No significant pleural effusion. Hepatobiliary: No focal liver abnormality is seen. No gallstones, gallbladder wall thickening, or biliary dilatation. Pancreas: Unremarkable. No pancreatic ductal dilatation or surrounding inflammatory changes. Spleen: Normal in size without focal abnormality. Adrenals/Urinary Tract: Adrenal glands are within normal limits. The kidneys show no hydronephrosis. The bladder is normal Stomach/Bowel: Esophageal tube tip in the body of stomach. Marked fluid distension of the stomach. Multiple loops of fluid-filled dilated small bowel measuring up to 6 cm. Peripheral eyes gas collections within the jejunum, series 3, image 50 with areas of non dependent gas, raising some concern for pneumatosis. No bowel wall thickening. Terminal ileum and distal small bowel are decompressed with hooked configuration of the central small bowel on series 3 image 43 through 59. right and transverse colon are moderately distended with fluid and air, the left colon is fluid-filled and relatively decompressed to the rectum. Vascular/Lymphatic: Nonaneurysmal aorta.  No suspicious lymph nodes. Reproductive: Enlarged uterus consistent with recent postpartum status. Enhancing mass along the posterior uterine corpus which may represent a fibroid. Other: No discrete free air is seen. Small volume free fluid in the upper abdomen with moderate fluid in the right greater than left lower quadrants. Low ventral incision with gas along the low anterior abdominal wall corresponding to history of C-section. No herniation of uterus or bowel contents at this location. Musculoskeletal: No  acute or suspicious osseous abnormality. IMPRESSION: 1. Enlarged uterus consistent with recent postpartum status. Gas along the low anterior abdominal wall presumably related to history of C-section, no herniation of uterus or bowel in the region of C-section incision. 2. Marked fluid distension of stomach. Multiple loops of fluid and air distended small bowel with additional finding of moderate fluid and air distension of proximal and transverse colon. Though much of the findings could be related to postoperative ileus, there is definite appearance of caliber change of the small bowel centrally within the abdomen with abnormal angular configuration of this central small bowel, raising concern for bowel obstruction. Furthermore there is abnormal appearance of proximal small bowel/jejunal loops which demonstrate non dependent peripheral gas, raising concern for pneumatosis. No free gas or portal venous gas. 3. Extensive bilateral lower lobe consolidation with mild consolidation at the lingula. Findings could be due to pneumonia or possible aspiration. Critical Value/emergent results were called by telephone at the time of interpretation on 07/04/2023 at 10:49 pm to provider MELISSA DAVIES , who verbally acknowledged these results. Electronically Signed   By: Jasmine Pang M.D.   On: 07/04/2023 22:49   DG Abd 1 View  Result Date: 07/04/2023 CLINICAL DATA:  Enteric tube placement EXAM: ABDOMEN - 1 VIEW COMPARISON:  Abdominal radiograph dated 07/04/2023 FINDINGS: Gastric/enteric tube tip projects over the medial left hemiabdomen, likely in the stomach. Partially imaged gaseous dilation of bowel loops. Imaged lung bases demonstrate patchy and hazy opacities. IMPRESSION: Gastric/enteric tube tip projects over the medial left hemiabdomen, likely in the stomach. Electronically Signed   By: Agustin Cree M.D.   On: 07/04/2023 18:50        Scheduled Meds:  budesonide  0.25 mg Nebulization BID   HYDROmorphone    Intravenous Q4H   insulin aspart  0-15 Units Subcutaneous Q4H   ketorolac  30 mg Intravenous Q6H   Or   ketorolac  30 mg Intramuscular Q6H   ketorolac       NIFEdipine  30 mg Oral Daily   pantoprazole (PROTONIX) IV  40 mg  Intravenous Q12H   prenatal multivitamin  1 tablet Oral Q1200   [START ON 07/07/2023] senna-docusate  2 tablet Oral Daily   simethicone  80 mg Oral TID PC   Continuous Infusions:  sodium chloride 10 mL/hr at 07/06/23 1214   ceFEPime (MAXIPIME) IV 2 g (07/06/23 1219)   lactated ringers 125 mL/hr at 07/06/23 1150   metronidazole Stopped (07/06/23 0243)   naloxone HCl (NARCAN) 2 mg in dextrose 5 % 250 mL infusion     potassium chloride 10 mEq (07/06/23 1154)     LOS: 7 days    Time spent: 35 minutes    Cleve Paolillo A Skya Mccullum, MD [facility]   If 7PM-7AM, please contact night-coverage [email or web address]  07/06/2023, 12:20 PM

## 2023-07-06 NOTE — Addendum Note (Signed)
Addendum  created 07/06/23 1207 by Aundria Rud, CRNA   Flowsheet accepted

## 2023-07-06 NOTE — Op Note (Signed)
Preop Diagnosis: 1.POD#7 s/p C/S 2.SMALL BOWEL OBSTRUCTION (on imaging)  Postop Diagnosis: 2.POD#7 s/p C/S 2.ISCHEMIC/NECROTIC RIGHT COLON  Procedure: Exploratory Laparotomy 2.Rt Hemicolectomy (w/Appendix)  Anesthesia: General   Anesthesiologist: Gaynelle Adu, MD   Attending: Osborn Coho, MD   Intra-op Consult: Dr. Cliffton Asters  Assistant: Surgical Tech  Findings: Right ischemic colon  Pathology: Cecum and right ascending colon  Fluids: 950cc including albumin  UOP: 100 cc  EBL: 50 cc  Complications: None  Procedure:  The patient was taken to the operating room after risks benefits and alternatives discussed with the patient and consent signed and witnessed.  Time out was performed per protocol and patient prepped and draped in the normal fashion.  I entered abdomen through the prior c-section scar.  Copious irrigation was performed.  I asked for general surgery to be called for intra-op consult and while exploring abdomen noticed the appearance of what appeared to be ischemic/necrotic bowel.  Upon Dr. Lucilla Lame arrival, he evaluated the situation and decided to proceed with a vertical incision.  I assisted him for the resection of the bowel.  Please see his op note.  After closing the vertical incision with staples, attention was turned to the pfannenstiel skin incision and peritoneum repaired with an running stitch of 2-0 chromic.  The fascia was repaired with 0 vicryl in a running fashion.  The skin was closed with 3-0 monocryl.  Steristrips with benzoin was applied.  Sponge lab and needle count was correct.  I was scrubbed for the entire procedure and assisted Dr. Cliffton Asters during his portion of the procedure and he assisted me for closure of the pfannenstiel.  An assistant was needed d/t the complexity of the anatomy.

## 2023-07-06 NOTE — Progress Notes (Signed)
I came to see pt as she if feeling worse with increased abdominal pain.  I reviewed options with patient and explained that the general surgeon, Dr. Bedelia Person, said the study she had last evening was diagnostic and therapeutic.  I called Dr. Margaree Mackintosh (on call for [facility] OR) for a second opinion regarding the study and he said the pt likely needed e-lap, the question was whether that needed to be done now (3:30am when I spoke with him) or later in the morning and he said he would speak with Dr. Bedelia Person.  I discussed the option of returning to the OR with the patient (as Dr. Bedelia Person was ordering another CT scan, the pt and her family did not desire another imaging study) and her family and immediately that is what they desired.  I explained to the patient that I would call the general surgeon for an intra-op consult and I informed Dr. Bedelia Person of the  plan and she stated she would come.  Risks benefits alternatives reviewed with the patient including but not limited to bleeding infection, injury, bowel resection and ostomy.  Questions answered and consent signed and witnessed.

## 2023-07-06 NOTE — Progress Notes (Signed)
NG tube placement at 54 cm on assessment. Previous documentation at 60 cm. MD notified. This RN repositioned NG to 60cm and stat KUB ordered per MD. Please see orders.

## 2023-07-06 NOTE — Progress Notes (Signed)
Postop check  Family had  been updated at conclusion of case alongside Dr. Su Hilt.   Doing well. No n/v with NG in place. Pain controlled. Mom in room at bedside  NAD, comfortable Abd soft, appropriate tenderness, wounds dressed/dry  Reviewed procedure, findings and plans moving forward. Answered her questions to her satisfaction  Cont NG to low intermittent wall sxn NPO MIVF  PPX: SQH (ordered to start tonight); SCDs  Marin Olp, MD [facility]

## 2023-07-06 NOTE — Transfer of Care (Signed)
Immediate Anesthesia Transfer of Care Note  Patient: Erika Lewis  Procedure(s) Performed: EXPLORATION LAPAROTOMY, RIGHT HEMICOLECTOMY (Abdomen)  Patient Location: PACU  Anesthesia Type:General  Level of Consciousness: awake, drowsy, and patient cooperative  Airway & Oxygen Therapy: Patient Spontanous Breathing and Patient connected to nasal cannula oxygen  Post-op Assessment: Report given to RN, Post -op Vital signs reviewed and stable, and Patient moving all extremities X 4  Post vital signs: Reviewed and stable  Last Vitals:  Vitals Value Taken Time  BP 145/80 07/06/23 0945  Temp    Pulse 112 07/06/23 0949  Resp 26 07/06/23 0949  SpO2 95 % 07/06/23 0949  Vitals shown include unfiled device data.  Last Pain:  Vitals:   07/06/23 0504  TempSrc: Oral  PainSc:       Patients Stated Pain Goal: 1 (07/05/23 2151)  Complications: No notable events documented.

## 2023-07-07 ENCOUNTER — Encounter (HOSPITAL_COMMUNITY): Payer: Self-pay | Admitting: Obstetrics and Gynecology

## 2023-07-07 DIAGNOSIS — K567 Ileus, unspecified: Secondary | ICD-10-CM | POA: Diagnosis not present

## 2023-07-07 DIAGNOSIS — J121 Respiratory syncytial virus pneumonia: Secondary | ICD-10-CM | POA: Diagnosis not present

## 2023-07-07 LAB — BASIC METABOLIC PANEL WITH GFR
Anion gap: 15 (ref 5–15)
BUN: 17 mg/dL (ref 6–20)
CO2: 25 mmol/L (ref 22–32)
Calcium: 7.5 mg/dL — ABNORMAL LOW (ref 8.9–10.3)
Chloride: 113 mmol/L — ABNORMAL HIGH (ref 98–111)
Creatinine, Ser: 0.86 mg/dL (ref 0.44–1.00)
GFR, Estimated: >60 mL/min
Glucose, Bld: 125 mg/dL — ABNORMAL HIGH (ref 70–99)
Potassium: 3.9 mmol/L (ref 3.5–5.1)
Sodium: 153 mmol/L — ABNORMAL HIGH (ref 135–145)

## 2023-07-07 LAB — BASIC METABOLIC PANEL
Anion gap: 12 (ref 5–15)
BUN: 13 mg/dL (ref 6–20)
CO2: 24 mmol/L (ref 22–32)
Calcium: 7.6 mg/dL — ABNORMAL LOW (ref 8.9–10.3)
Chloride: 113 mmol/L — ABNORMAL HIGH (ref 98–111)
Creatinine, Ser: 0.85 mg/dL (ref 0.44–1.00)
GFR, Estimated: >60 mL/min (ref >60)
Glucose, Bld: 139 mg/dL — ABNORMAL HIGH (ref 70–99)
Potassium: 3.5 mmol/L (ref 3.5–5.1)
Sodium: 149 mmol/L — ABNORMAL HIGH (ref 135–145)

## 2023-07-07 LAB — CBC
HCT: 32.6 % — ABNORMAL LOW (ref 36.0–46.0)
Hemoglobin: 9.6 g/dL — ABNORMAL LOW (ref 12.0–15.0)
MCH: 23.8 pg — ABNORMAL LOW (ref 26.0–34.0)
MCHC: 29.4 g/dL — ABNORMAL LOW (ref 30.0–36.0)
MCV: 80.9 fL (ref 80.0–100.0)
Platelets: 352 10*3/uL (ref 150–400)
RBC: 4.03 MIL/uL (ref 3.87–5.11)
RDW: 28.5 % — ABNORMAL HIGH (ref 11.5–15.5)
WBC: 14.6 10*3/uL — ABNORMAL HIGH (ref 4.0–10.5)
nRBC: 0.1 % (ref 0.0–0.2)

## 2023-07-07 LAB — GLUCOSE, CAPILLARY
Glucose-Capillary: 108 mg/dL — ABNORMAL HIGH (ref 70–99)
Glucose-Capillary: 109 mg/dL — ABNORMAL HIGH (ref 70–99)
Glucose-Capillary: 111 mg/dL — ABNORMAL HIGH (ref 70–99)
Glucose-Capillary: 119 mg/dL — ABNORMAL HIGH (ref 70–99)
Glucose-Capillary: 146 mg/dL — ABNORMAL HIGH (ref 70–99)
Glucose-Capillary: 165 mg/dL — ABNORMAL HIGH (ref 70–99)

## 2023-07-07 LAB — COMPREHENSIVE METABOLIC PANEL
ALT: 10 U/L (ref 0–44)
AST: 12 U/L — ABNORMAL LOW (ref 15–41)
Albumin: 1.7 g/dL — ABNORMAL LOW (ref 3.5–5.0)
Alkaline Phosphatase: 60 U/L (ref 38–126)
Anion gap: 12 (ref 5–15)
BUN: 20 mg/dL (ref 6–20)
CO2: 27 mmol/L (ref 22–32)
Calcium: 7.5 mg/dL — ABNORMAL LOW (ref 8.9–10.3)
Chloride: 112 mmol/L — ABNORMAL HIGH (ref 98–111)
Creatinine, Ser: 0.92 mg/dL (ref 0.44–1.00)
GFR, Estimated: >60 mL/min (ref >60)
Glucose, Bld: 112 mg/dL — ABNORMAL HIGH (ref 70–99)
Potassium: 3.3 mmol/L — ABNORMAL LOW (ref 3.5–5.1)
Sodium: 151 mmol/L — ABNORMAL HIGH (ref 135–145)
Total Bilirubin: 0.5 mg/dL (ref 0.3–1.2)
Total Protein: 5.1 g/dL — ABNORMAL LOW (ref 6.5–8.1)

## 2023-07-07 MED ORDER — HYDROMORPHONE HCL 1 MG/ML IJ SOLN: 1.0000 mg | INTRAMUSCULAR | Status: DC | PRN

## 2023-07-07 MED ORDER — POTASSIUM CHLORIDE 10 MEQ/100ML IV SOLN
10.0000 meq | INTRAVENOUS | Status: AC
  Administered 2023-07-07 (×4): 10 meq via INTRAVENOUS
  Filled 2023-07-07 (×2): qty 100

## 2023-07-07 MED ORDER — SODIUM CHLORIDE 0.45 % IV BOLUS
1000.0000 mL | Freq: Once | INTRAVENOUS | Status: AC
  Administered 2023-07-07: 1000 mL via INTRAVENOUS

## 2023-07-07 MED ORDER — SODIUM CHLORIDE 0.9 % IV BOLUS
1000.0000 mL | Freq: Once | INTRAVENOUS | Status: AC
  Administered 2023-07-07: 1000 mL via INTRAVENOUS

## 2023-07-07 MED ORDER — DEXTROSE 5 % IV SOLN: INTRAVENOUS | Status: DC

## 2023-07-07 NOTE — Progress Notes (Signed)
Subjective: Postpartum Day 8: Cesarean Delivery Patient reports + BM but runny. Productive cough.  No longer has the pain she had prior to bowel resection.    Objective: Vital signs in last 24 hours: Temp:  [98 F (36.7 C)-99.6 F (37.6 C)] 98.7 F (37.1 C) (10/06 0409) Pulse Rate:  [97-118] 101 (10/06 0409) Resp:  [16-23] 18 (10/06 0628) BP: (135-158)/(74-89) 143/82 (10/06 0409) SpO2:  [94 %-100 %] 99 % (10/06 0628) FiO2 (%):  [21 %] 21 % (10/05 1417)  Physical Exam:  General: alert, cooperative, and mild distress Lochia: appropriate Uterine Fundus: FF Abdomen: dressings c/d/I, improved BS but rt side still hypoactive Incision: healing well DVT Evaluation: no calf tenderness  Recent Labs    07/06/23 0456 07/07/23 0405  HGB 9.4* 9.6*  HCT 30.7* 32.6*    Assessment/Plan: Status post Cesarean section. Postoperative course complicated by bowel resection .  Overall pt is improving well. Continue current care. Foley d/c'd PCA d/c'd NG tube and diet per general surgery.  Appreciate their input. Encourage IS Yankauer at bedside for sputum production Encourage ambulation Mgmt of Sodium and DM per hospitalist - placed on D5 with SSI Echo with EF 60-65%  Lukasik Nails, MD 07/07/2023, 6:42 AM

## 2023-07-07 NOTE — Progress Notes (Signed)
NT assisted with patient walking in the hallway. Patient walked 1/4 of the hallway with her walker, while NT and husband stood beside her with IV poles. At the end of the walk patient stated that she had trouble breathing. Patient made it safely to her bed. Respiratory shortly came into her room with albuterol nebulizer.

## 2023-07-07 NOTE — Progress Notes (Addendum)
1 Day Post-Op  Subjective: CC: Reports no abdominal pain currently. Denies nausea. NGT with 100cc bilious output. Passing flatus. Large, non-bloody, liquid bm this am. Mobilizing in the halls.   Objective: Vital signs in last 24 hours: Temp:  [98 F (36.7 C)-99.6 F (37.6 C)] 98.7 F (37.1 C) (10/06 0409) Pulse Rate:  [97-118] 101 (10/06 0409) Resp:  [16-23] 18 (10/06 0628) BP: (135-158)/(74-89) 143/82 (10/06 0409) SpO2:  [94 %-100 %] 99 % (10/06 0628) FiO2 (%):  [21 %] 21 % (10/05 1417)    Intake/Output from previous day: 10/05 0701 - 10/06 0700 In: 950 [I.V.:700; IV Piggyback:250] Out: 1900 [Urine:1450; Emesis/NG output:100; Blood:50] Intake/Output this shift: No intake/output data recorded.  PE: Gen:  Alert, NAD, pleasant Abd: Soft, mild distension, appropriately ttp around incisions, no rigidity or guarding, +BS, midline and pfannenstiel incision with honeycomb dressings in place, cdi. NGT w/ bilious output.   Lab Results:  Recent Labs    07/06/23 0456 07/07/23 0405  WBC 11.4* 14.6*  HGB 9.4* 9.6*  HCT 30.7* 32.6*  PLT 300 352   BMET Recent Labs    07/06/23 0456 07/07/23 0405  NA 148* 151*  K 2.9* 3.3*  CL 107 112*  CO2 28 27  GLUCOSE 126* 112*  BUN 19 20  CREATININE 0.80 0.92  CALCIUM 8.2* 7.5*   PT/INR Recent Labs    07/04/23 2020  LABPROT 15.7*  INR 1.2   CMP     Component Value Date/Time   NA 151 (H) 07/07/2023 0405   K 3.3 (L) 07/07/2023 0405   CL 112 (H) 07/07/2023 0405   CO2 27 07/07/2023 0405   GLUCOSE 112 (H) 07/07/2023 0405   BUN 20 07/07/2023 0405   CREATININE 0.92 07/07/2023 0405   CALCIUM 7.5 (L) 07/07/2023 0405   PROT 5.1 (L) 07/07/2023 0405   ALBUMIN 1.7 (L) 07/07/2023 0405   AST 12 (L) 07/07/2023 0405   ALT 10 07/07/2023 0405   ALKPHOS 60 07/07/2023 0405   BILITOT 0.5 07/07/2023 0405   GFRNONAA >60 07/07/2023 0405   GFRAA >60 03/31/2019 0210   Lipase     Component Value Date/Time   LIPASE 27 09/09/2017 1803     Studies/Results: DG Abd 1 View  Result Date: 07/06/2023 CLINICAL DATA:  [id number].  Check NG tube placement. EXAM: ABDOMEN - 1 VIEW COMPARISON:  Flat plate portable abdomen exam yesterday at 8:26 p.m., CT with IV contrast 07/04/2023. FINDINGS: The bowel gas pattern is obstructive and mildly improved. Small bowel is dilated up to 4.8 cm previously 6 cm. NGT is in place the tip in the distal body of stomach. There are overlying skin staples. There is no supine evidence of free air. No radio-opaque calculi or other significant radiographic abnormality are seen. IMPRESSION: 1. The bowel gas pattern is obstructive but mildly improved. 2. NGT tip in the distal body of the stomach. Electronically Signed   By: Almira Bar M.D.   On: 07/06/2023 23:40   ECHOCARDIOGRAM COMPLETE  Result Date: 07/06/2023    ECHOCARDIOGRAM REPORT   Patient Name:   Erika Lewis Date of Exam: 07/06/2023 Medical Rec #:  [id number]        Height:       64.0 in Accession #:    [id number]       Weight:       254.1 lb Date of Birth:  04-Dec-1985        BSA:  2.166 m Patient Age:    36 years         BP:           151/76 mmHg Patient Gender: F                HR:           103 bpm. Exam Location:  Inpatient Procedure: 2D Echo, Color Doppler and Cardiac Doppler Indications:    cardiomegaly  History:        Patient has no prior history of Echocardiogram examinations.                 Status post cesarean section. Sickle cell disease.; Risk                 Factors:Hypertension and Diabetes.  Sonographer:    [name] RDCS Referring Phys: [id number] ANGELA ROBERTS  Sonographer Comments: Patient is obese. Image acquisition challenging due to patient body habitus. IMPRESSIONS  1. Left ventricular ejection fraction, by estimation, is 60 to 65%. The left ventricle has normal function. The left ventricle has no regional wall motion abnormalities. Left ventricular diastolic parameters are indeterminate.  2. Right ventricular systolic function  is normal. The right ventricular size is normal.  3. The mitral valve is normal in structure. No evidence of mitral valve regurgitation. No evidence of mitral stenosis.  4. The aortic valve is tricuspid. Aortic valve regurgitation is not visualized. No aortic stenosis is present.  5. The inferior vena cava is normal in size with greater than 50% respiratory variability, suggesting right atrial pressure of 3 mmHg. FINDINGS  Left Ventricle: Left ventricular ejection fraction, by estimation, is 60 to 65%. The left ventricle has normal function. The left ventricle has no regional wall motion abnormalities. The left ventricular internal cavity size was normal in size. There is  no left ventricular hypertrophy. Left ventricular diastolic parameters are indeterminate. Right Ventricle: The right ventricular size is normal. No increase in right ventricular wall thickness. Right ventricular systolic function is normal. Left Atrium: Left atrial size was normal in size. Right Atrium: Right atrial size was normal in size. Pericardium: Trivial pericardial effusion is present. The pericardial effusion is posterior to the left ventricle. Mitral Valve: The mitral valve is normal in structure. No evidence of mitral valve regurgitation. No evidence of mitral valve stenosis. Tricuspid Valve: The tricuspid valve is normal in structure. Tricuspid valve regurgitation is trivial. No evidence of tricuspid stenosis. Aortic Valve: The aortic valve is tricuspid. Aortic valve regurgitation is not visualized. No aortic stenosis is present. Pulmonic Valve: The pulmonic valve was normal in structure. Pulmonic valve regurgitation is trivial. No evidence of pulmonic stenosis. Aorta: The aortic root is normal in size and structure. Venous: The inferior vena cava is normal in size with greater than 50% respiratory variability, suggesting right atrial pressure of 3 mmHg. IAS/Shunts: The interatrial septum was not well visualized.  LEFT VENTRICLE PLAX  2D LVIDd:         4.20 cm LVIDs:         2.80 cm LV PW:         1.20 cm LV IVS:        1.10 cm LVOT diam:     1.90 cm LV SV:         66 LV SV Index:   31 LVOT Area:     2.84 cm  RIGHT VENTRICLE             IVC RV Basal  diam:  2.20 cm     IVC diam: 1.40 cm RV S prime:     11.60 cm/s TAPSE (M-mode): 2.0 cm LEFT ATRIUM             Index        RIGHT ATRIUM           Index LA diam:        3.10 cm 1.43 cm/m   RA Area:     15.10 cm LA Vol (A2C):   38.5 ml 17.78 ml/m  RA Volume:   35.30 ml  16.30 ml/m LA Vol (A4C):   50.4 ml 23.27 ml/m LA Biplane Vol: 45.0 ml 20.78 ml/m  AORTIC VALVE LVOT Vmax:   143.00 cm/s LVOT Vmean:  94.700 cm/s LVOT VTI:    0.233 m  AORTA Ao Root diam: 2.60 cm Ao Asc diam:  2.70 cm  SHUNTS Systemic VTI:  0.23 m Systemic Diam: 1.90 cm Charlton Haws MD Electronically signed by Charlton Haws MD Signature Date/Time: 07/06/2023/3:50:31 PM    Final    PERIPHERAL VASCULAR CATHETERIZATION  Result Date: 07/06/2023 See surgical note for result.  DG Abd Portable 1V-Small Bowel Obstruction Protocol-initial, 8 hr delay  Result Date: 07/06/2023 CLINICAL DATA:  Small-bowel obstruction EXAM: PORTABLE ABDOMEN - 1 VIEW COMPARISON:  None Available. FINDINGS: Nasogastric tube overlies the mid body of the stomach. Administered probable enteric contrast opacifies the gastric fundus. Multiple dilated loops of bowel are again seen throughout the abdomen in keeping with a probable mid to distal small bowel obstruction. No gross free intraperitoneal gas. Bibasilar pulmonary infiltrates are again seen. IMPRESSION: 1. Persistent mid to distal small bowel obstruction. 2. Bibasilar pulmonary infiltrates. Electronically Signed   By: Helyn Numbers M.D.   On: 07/06/2023 00:37    Anti-infectives: Anti-infectives (From admission, onward)    Start     Dose/Rate Route Frequency Ordered Stop   07/05/23 0945  metroNIDAZOLE (FLAGYL) IVPB 500 mg        500 mg 100 mL/hr over 60 Minutes Intravenous Every 12 hours 07/05/23  0846     07/04/23 2100  metroNIDAZOLE (FLAGYL) IVPB 500 mg  Status:  Discontinued        500 mg 100 mL/hr over 60 Minutes Intravenous Every 8 hours 07/04/23 1923 07/05/23 0125   07/04/23 2000  ceFEPIme (MAXIPIME) 2 g in sodium chloride 0.9 % 100 mL IVPB        2 g 200 mL/hr over 30 Minutes Intravenous Every 8 hours 07/04/23 1923     07/03/23 0917  Ampicillin-Sulbactam (UNASYN) 3 g in sodium chloride 0.9 % 100 mL IVPB  Status:  Discontinued        3 g 200 mL/hr over 30 Minutes Intravenous Every 6 hours 07/03/23 0917 07/04/23 0943   06/29/23 0909  azithromycin (ZITHROMAX) 500 mg in sodium chloride 0.9 % 250 mL IVPB  Status:  Discontinued        500 mg 250 mL/hr over 60 Minutes Intravenous 60 min pre-op 06/29/23 0909 06/29/23 1547   06/29/23 0909  ceFAZolin (ANCEF) IVPB 2g/100 mL premix        2 g 200 mL/hr over 30 Minutes Intravenous 30 min pre-op 06/29/23 8295 06/29/23 1135        Assessment/Plan Hx of C-section 9/28 POD 1 s/p  Exploratory laparotomy with 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 Cliffton Asters on 07/06/23 - Discussed with attending - will do trial of NGT clamping and cld. If  tolerates, okay for NGT removal in PM.  - PCA per primary  - Mobilize - Pulm toilet  FEN - NPO, NGT clamped. IVF at 190ml/hr per TRH/primary.  VTE - SCDs, Lovenox (hgb stable at 9.6) ID - On Cefepime/Flagyl for PNA. This would also provide adequate intra-abdominal coverage. Afebrile. Tachycardia. No hypotension.,  Foley - In place, okay for removal from our standpoint. Defer to primary     LOS: 8 days    Jacinto Halim , [facility] Surgery 07/07/2023, 8:01 AM Please see Amion for pager number during day hours 7:00am-4:30pm

## 2023-07-07 NOTE — Lactation Note (Signed)
This note was copied from a baby'Lewis chart.  NICU Lactation Consultation Note  Patient Name: Erika Lewis [id number] Date: 07/07/2023 Age:37 days  Reason for consult: Weekly NICU follow-up; NICU baby; Infant < 6lbs; Multiple gestation; Maternal endocrine disorder; Early term 5-38.6wks; Other (Comment) (cHTN, AMA, IUGR, small bowel obstruction surgery post-partum) Type of Endocrine Disorder?: Diabetes (DM2 (insulin))  SUBJECTIVE Visited with family of 85 74/23 weeks old AGA NICU twin female; Ms. Lewis is a P3 and reports she had to have surgery yesterday, she'Lewis just recovering from it and re-initiated pumping this morning but "nothing came out". She denies any Lewis/Lewis of engorgement and said that she'Lewis planing on starting pumping again. Revised the importance of consistent pumping for the onset of lactogenesis II and the prevention of engorgement. Baby "Erika Lewis" currently on Similac 20 calorie formula and taking about 50% or more of her PO feedings.  OBJECTIVE Infant data: Mother'Lewis Current Feeding Choice: Breast Milk and Formula  O2 Device: Room Air  Infant feeding assessment Scale for Readiness: 1 Scale for Quality: 2   Maternal data: [id number] C-Section, Low Transverse Pumping frequency: 1 time/24 hours, just re-started pumping this morning Pumped volume: 0 mL Flange Size: 21  Pump: Personal ([place] DEBP)  ASSESSMENT Infant: Feeding Status: Scheduled 8-11-2-5 Feeding method: Bottle Nipple Type: Nfant Slow Flow (purple)  Maternal: Milk volume: Low  INTERVENTIONS/PLAN Interventions: Interventions: Breast feeding basics reviewed; DEBP; Education Tools: Pump; Flanges Pump Education: Setup, frequency, and cleaning; Milk Storage  Plan: Encouraged pumping every 3 hours, ideally 8 pumping sessions/24 hours since she'Lewis still recovery from surgery she might go at her own pace She'll take all pump parts to baby'Lewis room after her discharge She'll switch her pump settings from  initiate to maintain mode once she starts getting 20 ml of EBM combined She'll call for assistance when baby is ready to go to breast   Female visitors present and supportive. All questions and concerns answered, family to contact [facility] services PRN.  Consult Status: NICU follow-up NICU Follow-up type: Verify absence of engorgement; Verify onset of copious milk   Erika Lewis Erika Lewis 07/07/2023, 2:35 PM

## 2023-07-07 NOTE — Progress Notes (Signed)
PROGRESS NOTE    Erika Lewis  ZDG:[id number] DOB: January 18, 1986 DOA: 06/29/2023 PCP: Nigel Bridgeman, CNM   Brief Narrative: 37 year old with past medical history significant for diabetes type 2, hypertension, asthma, sickle cell trait underwent C-section and salpingectomy on 9/28.  Postoperative patient had postpartum hemorrhage.  She was transfused and received IV iron.  She developed abdominal distention, nausea and vomiting.  CT abdomen and pelvis showed marked fluid distention of the stomach and distended small bowel loop.  Extensive bilateral consolidation in the lungs.  Negative for fascial dehiscence.  Hospitalist consulted for management of pneumonia and medical problems. Post surgery patient develop SBO versus ileus, pneumonia, also found to have Rhinovirus   Assessment & Plan:   Principal Problem:   Chronic hypertension affecting pregnancy Active Problems:   S/P cesarean section   Acute postoperative anemia due to greater than expected blood loss   Postpartum hemorrhage   Rhinovirus   RSV (respiratory syncytial virus pneumonia)  Thank you for consult, will continue to follow with you  1-Pneumonia: Aspiration   pneumonia, Rhinovirus.  Follow blood cultures: No growth to date Lactic acid negative Continue with Cefepime and Flagyl.  Oxygen sat stable on 2 L.   SBO, Ischemic wall of cecum without perforation or abscess.  Underwent S/P exploratory laparotomy with right hemicolectomy 10/05. Surgery Following NG tube placed on 10/03 yielding 900 cc Management per Sx.  Replete electrolytes.  Surgery recommend clear liquid diet today.  Had BM.   Hypokalemia: Replete IV.  Hypernatremia:  Plan to change IV fluids to D 5 at 125cc Will also give her 1 L iv bolus.  Repeat Bmet BID, adjust fluids and IV bolus as needed.   Hypoalbuminemia She will need supplement when tolerating oral. .   AKI Foley catheter in place Improved With hydration  Mild Pulmonary  congestion Cardiomegaly w/ mild congestion noted on CXR.  IV Lasix x 1 given.  ECHO Normal EF  BNP 31.  DM type 2: Continue with SSI  Chronic HTN, affecting pregnancy PRN Hydralazine, labetalol.  On nifedipine, resume when tolerating oral.   Asthma: Pulmicort. PRN albuterol.   Post Partum Twins. PPH S/P C section and PPH/  Per GYN OB Hb stable.      Estimated body mass index is 43.62 kg/m as calculated from the following:   Height as of this encounter: 5\' 4"  (1.626 m).   Weight as of this encounter: 115.3 kg.   DVT prophylaxis: SCD Code Status: Full code Family Communication: Care discussed with patient Disposition Plan:  Status is: Inpatient Remains inpatient appropriate because: management of PNA   Antimicrobials:    Subjective: She is alert, she had BM. Still has NG tube in place. Denies abdominal pain. Cough has improved.   Objective: Vitals:   07/07/23 0836 07/07/23 0951 07/07/23 1000 07/07/23 1200  BP:    (!) 152/78  Pulse:    (!) 109  Resp:  20    Temp:      TempSrc:      SpO2: 100% 100% 99% 94%  Weight:      Height:        Intake/Output Summary (Last 24 hours) at 07/07/2023 1259 Last data filed at 07/07/2023 0937 Gross per 24 hour  Intake 0 ml  Output 1350 ml  Net -1350 ml   Filed Weights   06/29/23 0940  Weight: 115.3 kg    Examination:  General exam: NAD Respiratory system: CTA Cardiovascular system: S 1, S 2 RRRR Gastrointestinal system: BS ,  midline incision with staples, honeycomb dressing.  Central nervous system: Alert, conversant Extremities: no edema    Data Reviewed: I have personally reviewed following labs and imaging studies  CBC: Recent Labs  Lab 07/03/23 0610 07/04/23 2020 07/05/23 0203 07/06/23 0456 07/07/23 0405  WBC 14.2* 10.2 8.1 11.4* 14.6*  NEUTROABS 12.6* 8.2* 6.6 9.7*  --   HGB 10.4* 9.1* 9.4* 9.4* 9.6*  HCT 33.1* 28.8* 29.9* 30.7* 32.6*  MCV 77.9* 79.6* 77.3* 78.9* 80.9  PLT 209 340 266 300 352    Basic Metabolic Panel: Recent Labs  Lab 07/04/23 1101 07/04/23 2020 07/05/23 0203 07/06/23 0456 07/07/23 0405  NA 139 142 144 148* 151*  K 3.3* 3.1* 3.0* 2.9* 3.3*  CL 99 100 101 107 112*  CO2 26 24 25 28 27   GLUCOSE 126* 144* 149* 126* 112*  BUN 17 18 20 19 20   CREATININE 1.20* 1.14* 1.12* 0.80 0.92  CALCIUM 8.1* 8.2* 8.1* 8.2* 7.5*  MG  --   --   --  2.7*  --    GFR: Estimated Creatinine Clearance: 105.3 mL/min (by C-G formula based on SCr of 0.92 mg/dL). Liver Function Tests: Recent Labs  Lab 07/04/23 1101 07/04/23 2020 07/05/23 0203 07/06/23 0456 07/07/23 0405  AST 21 12* 12* 15 12*  ALT 15 17 15 13 10   ALKPHOS 69 73 58 65 60  BILITOT 0.8 0.9 0.1* 0.6 0.5  PROT 5.3* 5.6* 5.0* 5.4* 5.1*  ALBUMIN 1.9* 1.9* 1.7* 1.8* 1.7*   No results for input(s): "LIPASE", "AMYLASE" in the last 168 hours. No results for input(s): "AMMONIA" in the last 168 hours. Coagulation Profile: Recent Labs  Lab 07/04/23 2020  INR 1.2   Cardiac Enzymes: No results for input(s): "CKTOTAL", "CKMB", "CKMBINDEX", "TROPONINI" in the last 168 hours. BNP (last 3 results) No results for input(s): "PROBNP" in the last 8760 hours. HbA1C: No results for input(s): "HGBA1C" in the last 72 hours.  CBG: Recent Labs  Lab 07/06/23 2009 07/07/23 0018 07/07/23 0409 07/07/23 0845 07/07/23 1203  GLUCAP 111* 111* 119* 108* 109*   Lipid Profile: No results for input(s): "CHOL", "HDL", "LDLCALC", "TRIG", "CHOLHDL", "LDLDIRECT" in the last 72 hours. Thyroid Function Tests: No results for input(s): "TSH", "T4TOTAL", "FREET4", "T3FREE", "THYROIDAB" in the last 72 hours. Anemia Panel: No results for input(s): "VITAMINB12", "FOLATE", "FERRITIN", "TIBC", "IRON", "RETICCTPCT" in the last 72 hours. Sepsis Labs: Recent Labs  Lab 07/04/23 2020 07/04/23 2226  LATICACIDVEN 1.9 1.5    Recent Results (from the past 240 hour(s))  Culture, OB Urine     Status: Abnormal   Collection Time: 07/03/23  5:57  AM   Specimen: Urine, Random  Result Value Ref Range Status   Specimen Description URINE, RANDOM  Final   Special Requests NONE  Final   Culture (A)  Final    MULTIPLE SPECIES PRESENT, SUGGEST RECOLLECTION NO GROUP B STREP (S.AGALACTIAE) ISOLATED Performed at [facility], [address], [place], [place] [address]    Report Status 07/04/2023 FINAL  Final  Respiratory (~20 pathogens) panel by PCR     Status: Abnormal   Collection Time: 07/04/23  4:30 AM   Specimen: Nasopharyngeal Swab; Respiratory  Result Value Ref Range Status   Adenovirus NOT DETECTED NOT DETECTED Final   Coronavirus 229E NOT DETECTED NOT DETECTED Final    Comment: (NOTE) The Coronavirus on the Respiratory Panel, DOES NOT test for the novel  Coronavirus (2019 nCoV)    Coronavirus HKU1 NOT DETECTED NOT DETECTED Final  Coronavirus NL63 NOT DETECTED NOT DETECTED Final   Coronavirus OC43 NOT DETECTED NOT DETECTED Final   Metapneumovirus NOT DETECTED NOT DETECTED Final   Rhinovirus / Enterovirus DETECTED (A) NOT DETECTED Final   Influenza A NOT DETECTED NOT DETECTED Final   Influenza B NOT DETECTED NOT DETECTED Final   Parainfluenza Virus 1 NOT DETECTED NOT DETECTED Final   Parainfluenza Virus 2 NOT DETECTED NOT DETECTED Final   Parainfluenza Virus 3 NOT DETECTED NOT DETECTED Final   Parainfluenza Virus 4 NOT DETECTED NOT DETECTED Final   Respiratory Syncytial Virus NOT DETECTED NOT DETECTED Final   Bordetella pertussis NOT DETECTED NOT DETECTED Final   Bordetella Parapertussis NOT DETECTED NOT DETECTED Final   Chlamydophila pneumoniae NOT DETECTED NOT DETECTED Final   Mycoplasma pneumoniae NOT DETECTED NOT DETECTED Final    Comment: Performed at [facility], [address] [place], [place] [address]  Culture, blood (x 2)     Status: None (Preliminary result)   Collection Time: 07/04/23  8:20 PM   Specimen: BLOOD RIGHT ARM  Result Value Ref Range Status   Specimen Description BLOOD RIGHT ARM   Final   Special Requests   Final    BOTTLES DRAWN AEROBIC AND ANAEROBIC Blood Culture adequate volume   Culture   Final    NO GROWTH 3 DAYS Performed at [facility], [address] [place], [place] [address]    Report Status PENDING  Incomplete  Culture, blood (x 2)     Status: None (Preliminary result)   Collection Time: 07/04/23  8:21 PM   Specimen: BLOOD RIGHT HAND  Result Value Ref Range Status   Specimen Description BLOOD RIGHT HAND  Final   Special Requests   Final    BOTTLES DRAWN AEROBIC AND ANAEROBIC Blood Culture adequate volume   Culture   Final    NO GROWTH 3 DAYS Performed at [facility], [address], [place], [place] [address]    Report Status PENDING  Incomplete         Radiology Studies: DG Abd 1 View  Result Date: 07/06/2023 CLINICAL DATA:  [id number].  Check NG tube placement. EXAM: ABDOMEN - 1 VIEW COMPARISON:  Flat plate portable abdomen exam yesterday at 8:26 p.m., CT with IV contrast 07/04/2023. FINDINGS: The bowel gas pattern is obstructive and mildly improved. Small bowel is dilated up to 4.8 cm previously 6 cm. NGT is in place the tip in the distal body of stomach. There are overlying skin staples. There is no supine evidence of free air. No radio-opaque calculi or other significant radiographic abnormality are seen. IMPRESSION: 1. The bowel gas pattern is obstructive but mildly improved. 2. NGT tip in the distal body of the stomach. Electronically Signed   By: Almira Bar M.D.   On: 07/06/2023 23:40   ECHOCARDIOGRAM COMPLETE  Result Date: 07/06/2023    ECHOCARDIOGRAM REPORT   Patient Name:   DEVORAH GIVHAN Date of Exam: 07/06/2023 Medical Rec #:  [id number]        Height:       64.0 in Accession #:    [id number]       Weight:       254.1 lb Date of Birth:  [birth year]        BSA:          2.166 m Patient Age:    36 years         BP:  151/76 mmHg Patient Gender: F                HR:           103 bpm. Exam Location:  Inpatient  Procedure: 2D Echo, Color Doppler and Cardiac Doppler Indications:    cardiomegaly  History:        Patient has no prior history of Echocardiogram examinations.                 Status post cesarean section. Sickle cell disease.; Risk                 Factors:Hypertension and Diabetes.  Sonographer:    [name] RDCS Referring Phys: [id number] ANGELA ROBERTS  Sonographer Comments: Patient is obese. Image acquisition challenging due to patient body habitus. IMPRESSIONS  1. Left ventricular ejection fraction, by estimation, is 60 to 65%. The left ventricle has normal function. The left ventricle has no regional wall motion abnormalities. Left ventricular diastolic parameters are indeterminate.  2. Right ventricular systolic function is normal. The right ventricular size is normal.  3. The mitral valve is normal in structure. No evidence of mitral valve regurgitation. No evidence of mitral stenosis.  4. The aortic valve is tricuspid. Aortic valve regurgitation is not visualized. No aortic stenosis is present.  5. The inferior vena cava is normal in size with greater than 50% respiratory variability, suggesting right atrial pressure of 3 mmHg. FINDINGS  Left Ventricle: Left ventricular ejection fraction, by estimation, is 60 to 65%. The left ventricle has normal function. The left ventricle has no regional wall motion abnormalities. The left ventricular internal cavity size was normal in size. There is  no left ventricular hypertrophy. Left ventricular diastolic parameters are indeterminate. Right Ventricle: The right ventricular size is normal. No increase in right ventricular wall thickness. Right ventricular systolic function is normal. Left Atrium: Left atrial size was normal in size. Right Atrium: Right atrial size was normal in size. Pericardium: Trivial pericardial effusion is present. The pericardial effusion is posterior to the left ventricle. Mitral Valve: The mitral valve is normal in structure. No evidence  of mitral valve regurgitation. No evidence of mitral valve stenosis. Tricuspid Valve: The tricuspid valve is normal in structure. Tricuspid valve regurgitation is trivial. No evidence of tricuspid stenosis. Aortic Valve: The aortic valve is tricuspid. Aortic valve regurgitation is not visualized. No aortic stenosis is present. Pulmonic Valve: The pulmonic valve was normal in structure. Pulmonic valve regurgitation is trivial. No evidence of pulmonic stenosis. Aorta: The aortic root is normal in size and structure. Venous: The inferior vena cava is normal in size with greater than 50% respiratory variability, suggesting right atrial pressure of 3 mmHg. IAS/Shunts: The interatrial septum was not well visualized.  LEFT VENTRICLE PLAX 2D LVIDd:         4.20 cm LVIDs:         2.80 cm LV PW:         1.20 cm LV IVS:        1.10 cm LVOT diam:     1.90 cm LV SV:         66 LV SV Index:   31 LVOT Area:     2.84 cm  RIGHT VENTRICLE             IVC RV Basal diam:  2.20 cm     IVC diam: 1.40 cm RV S prime:     11.60 cm/s TAPSE (M-mode): 2.0 cm LEFT ATRIUM  Index        RIGHT ATRIUM           Index LA diam:        3.10 cm 1.43 cm/m   RA Area:     15.10 cm LA Vol (A2C):   38.5 ml 17.78 ml/m  RA Volume:   35.30 ml  16.30 ml/m LA Vol (A4C):   50.4 ml 23.27 ml/m LA Biplane Vol: 45.0 ml 20.78 ml/m  AORTIC VALVE LVOT Vmax:   143.00 cm/s LVOT Vmean:  94.700 cm/s LVOT VTI:    0.233 m  AORTA Ao Root diam: 2.60 cm Ao Asc diam:  2.70 cm  SHUNTS Systemic VTI:  0.23 m Systemic Diam: 1.90 cm Charlton Haws MD Electronically signed by Charlton Haws MD Signature Date/Time: 07/06/2023/3:50:31 PM    Final    PERIPHERAL VASCULAR CATHETERIZATION  Result Date: 07/06/2023 See surgical note for result.  DG Abd Portable 1V-Small Bowel Obstruction Protocol-initial, 8 hr delay  Result Date: 07/06/2023 CLINICAL DATA:  Small-bowel obstruction EXAM: PORTABLE ABDOMEN - 1 VIEW COMPARISON:  None Available. FINDINGS: Nasogastric tube  overlies the mid body of the stomach. Administered probable enteric contrast opacifies the gastric fundus. Multiple dilated loops of bowel are again seen throughout the abdomen in keeping with a probable mid to distal small bowel obstruction. No gross free intraperitoneal gas. Bibasilar pulmonary infiltrates are again seen. IMPRESSION: 1. Persistent mid to distal small bowel obstruction. 2. Bibasilar pulmonary infiltrates. Electronically Signed   By: Helyn Numbers M.D.   On: 07/06/2023 00:37        Scheduled Meds:  budesonide  0.25 mg Nebulization BID   enoxaparin (LOVENOX) injection  60 mg Subcutaneous Q24H   insulin aspart  0-15 Units Subcutaneous Q4H   ketorolac  30 mg Intravenous Q6H   Or   ketorolac  30 mg Intramuscular Q6H   NIFEdipine  30 mg Oral Daily   pantoprazole (PROTONIX) IV  40 mg Intravenous Q12H   prenatal multivitamin  1 tablet Oral Q1200   senna-docusate  2 tablet Oral Daily   simethicone  80 mg Oral TID PC   Continuous Infusions:  sodium chloride 10 mL/hr at 07/06/23 1214   ceFEPime (MAXIPIME) IV 2 g (07/07/23 1205)   dextrose 125 mL/hr at 07/07/23 0940   metronidazole 500 mg (07/07/23 0959)   naloxone HCl (NARCAN) 2 mg in dextrose 5 % 250 mL infusion     potassium chloride 10 mEq (07/07/23 1247)     LOS: 8 days    Time spent: 35 minutes    Caitlyne Ingham A [place] Bryngelson, MD [facility]   If 7PM-7AM, please contact night-coverage [email or web address]  07/07/2023, 12:59 PM

## 2023-07-08 DIAGNOSIS — J121 Respiratory syncytial virus pneumonia: Secondary | ICD-10-CM | POA: Diagnosis not present

## 2023-07-08 DIAGNOSIS — K567 Ileus, unspecified: Secondary | ICD-10-CM | POA: Diagnosis not present

## 2023-07-08 LAB — BASIC METABOLIC PANEL
Anion gap: 12 (ref 5–15)
BUN: 8 mg/dL (ref 6–20)
CO2: 20 mmol/L — ABNORMAL LOW (ref 22–32)
Calcium: 7.2 mg/dL — ABNORMAL LOW (ref 8.9–10.3)
Chloride: 111 mmol/L (ref 98–111)
Creatinine, Ser: 0.72 mg/dL (ref 0.44–1.00)
GFR, Estimated: >60 mL/min (ref >60)
Glucose, Bld: 95 mg/dL (ref 70–99)
Potassium: 4.3 mmol/L (ref 3.5–5.1)
Sodium: 143 mmol/L (ref 135–145)

## 2023-07-08 LAB — GLUCOSE, CAPILLARY
Glucose-Capillary: 105 mg/dL — ABNORMAL HIGH (ref 70–99)
Glucose-Capillary: 115 mg/dL — ABNORMAL HIGH (ref 70–99)
Glucose-Capillary: 117 mg/dL — ABNORMAL HIGH (ref 70–99)
Glucose-Capillary: 130 mg/dL — ABNORMAL HIGH (ref 70–99)
Glucose-Capillary: 135 mg/dL — ABNORMAL HIGH (ref 70–99)
Glucose-Capillary: 141 mg/dL — ABNORMAL HIGH (ref 70–99)

## 2023-07-08 LAB — CBC
HCT: 29.1 % — ABNORMAL LOW (ref 36.0–46.0)
Hemoglobin: 8.9 g/dL — ABNORMAL LOW (ref 12.0–15.0)
MCH: 24.7 pg — ABNORMAL LOW (ref 26.0–34.0)
MCHC: 30.6 g/dL (ref 30.0–36.0)
MCV: 80.8 fL (ref 80.0–100.0)
Platelets: 265 10*3/uL (ref 150–400)
RBC: 3.6 MIL/uL — ABNORMAL LOW (ref 3.87–5.11)
RDW: 27.7 % — ABNORMAL HIGH (ref 11.5–15.5)
WBC: 14.5 10*3/uL — ABNORMAL HIGH (ref 4.0–10.5)
nRBC: 1.1 % — ABNORMAL HIGH (ref 0.0–0.2)

## 2023-07-08 MED ORDER — METRONIDAZOLE 500 MG/100ML IV SOLN
500.0000 mg | Freq: Two times a day (BID) | INTRAVENOUS | Status: DC
  Filled 2023-07-08: qty 100

## 2023-07-08 MED ORDER — SODIUM CHLORIDE 0.9 % IV SOLN
2.0000 g | Freq: Three times a day (TID) | INTRAVENOUS | Status: DC
  Filled 2023-07-08: qty 12.5

## 2023-07-08 MED ORDER — GUAIFENESIN ER 600 MG PO TB12
1200.0000 mg | ORAL_TABLET | Freq: Two times a day (BID) | ORAL | Status: DC
  Administered 2023-07-08 – 2023-07-10 (×5): 1200 mg via ORAL
  Filled 2023-07-08 (×6): qty 2

## 2023-07-08 MED ORDER — AMOXICILLIN-POT CLAVULANATE 875-125 MG PO TABS
1.0000 | ORAL_TABLET | Freq: Two times a day (BID) | ORAL | Status: AC
  Administered 2023-07-08 – 2023-07-09 (×4): 1 via ORAL
  Filled 2023-07-08 (×4): qty 1

## 2023-07-08 MED ORDER — PANTOPRAZOLE SODIUM 40 MG PO TBEC
40.0000 mg | DELAYED_RELEASE_TABLET | Freq: Two times a day (BID) | ORAL | Status: DC
  Administered 2023-07-08 – 2023-07-10 (×5): 40 mg via ORAL
  Filled 2023-07-08 (×5): qty 1

## 2023-07-08 MED ORDER — ALBUTEROL SULFATE (2.5 MG/3ML) 0.083% IN NEBU
3.0000 mL | INHALATION_SOLUTION | Freq: Four times a day (QID) | RESPIRATORY_TRACT | Status: DC | PRN
  Administered 2023-07-09: 3 mL via RESPIRATORY_TRACT
  Filled 2023-07-08: qty 3

## 2023-07-08 NOTE — Evaluation (Signed)
Physical Therapy Evaluation Patient Details Name: Erika Lewis MRN: [id number] DOB: 02-Dec-1985 Today's Date: 07/08/2023  History of Present Illness  Pt is 37 year old presented to [facility] on  06/29/23 for c-section of twins at 36 weeks. Pt with post op nausea and vomiting. Pt found to have PNA and RSV. Pt underwent exploratory laparotomy with rt hemicolectomy for ischemic wall of cecum on 07/06/23. PMH - HTN, DM, asthma, sickle cell trait  Clinical Impression  Pt with decr in mobility due to post op pain. Pt able to amb in hallway with walker and should continue to improve as pain improves. Recommend rolling walker and 3-in-1 commode for home. Will follow acutely but don't feel she will need PT as dc.         If plan is discharge home, recommend the following:     Can travel by private vehicle        Equipment Recommendations Rolling walker (2 wheels);BSC/3in1  Recommendations for Other Services       Functional Status Assessment Patient has had a recent decline in their functional status and demonstrates the ability to make significant improvements in function in a reasonable and predictable amount of time.     Precautions / Restrictions Precautions Precautions: None      Mobility  Bed Mobility               General bed mobility comments: Pt up in chair    Transfers Overall transfer level: Needs assistance Equipment used: Rolling walker (2 wheels) Transfers: Sit to/from Stand Sit to Stand: Contact guard assist           General transfer comment: Incr time and effort due to pain    Ambulation/Gait Ambulation/Gait assistance: Contact guard assist, Supervision Gait Distance (Feet): 250 Feet Assistive device: Rolling walker (2 wheels), None Gait Pattern/deviations: Step-through pattern, Decreased stride length, Trunk flexed Gait velocity: decr Gait velocity interpretation: 1.31 - 2.62 ft/sec, indicative of limited community ambulator   General Gait Details:  Assist for safety. More confident and more comfortable with use of walker.  Stairs            Wheelchair Mobility     Tilt Bed    Modified Rankin (Stroke Patients Only)       Balance Overall balance assessment: No apparent balance deficits (not formally assessed)                                           Pertinent Vitals/Pain Pain Assessment Pain Assessment: Faces Faces Pain Scale: Hurts little more Pain Location: abdomen Pain Descriptors / Indicators: Guarding, Grimacing Pain Intervention(s): Limited activity within patient's tolerance, Monitored during session    Home Living Family/patient expects to be discharged to:: Private residence Living Arrangements: Alone;Children Available Help at Discharge: Family Type of Home: Apartment Home Access: Level entry       Home Layout: One level Home Equipment: None      Prior Function Prior Level of Function : Independent/Modified Independent                     Extremity/Trunk Assessment   Upper Extremity Assessment Upper Extremity Assessment: Overall WFL for tasks assessed    Lower Extremity Assessment Lower Extremity Assessment: Overall WFL for tasks assessed       Communication   Communication Communication: No apparent difficulties  Cognition Arousal: Alert Behavior  During Therapy: WFL for tasks assessed/performed Overall Cognitive Status: Within Functional Limits for tasks assessed                                          General Comments      Exercises     Assessment/Plan    PT Assessment Patient needs continued PT services  PT Problem List Decreased mobility;Pain       PT Treatment Interventions DME instruction;Gait training;Functional mobility training;Therapeutic activities;Patient/family education    PT Goals (Current goals can be found in the Care Plan section)  Acute Rehab PT Goals Patient Stated Goal: return home PT Goal Formulation:  With patient Time For Goal Achievement: 07/15/23 Potential to Achieve Goals: Good    Frequency Min 1X/week     Co-evaluation               AM-PAC PT "6 Clicks" Mobility  Outcome Measure Help needed turning from your back to your side while in a flat bed without using bedrails?: A Little Help needed moving from lying on your back to sitting on the side of a flat bed without using bedrails?: A Little Help needed moving to and from a bed to a chair (including a wheelchair)?: A Little Help needed standing up from a chair using your arms (e.g., wheelchair or bedside chair)?: A Little Help needed to walk in hospital room?: A Little Help needed climbing 3-5 steps with a railing? : A Little 6 Click Score: 18    End of Session   Activity Tolerance: Patient tolerated treatment well Patient left: in chair;with family/visitor present Nurse Communication: Mobility status PT Visit Diagnosis: Other abnormalities of gait and mobility (R26.89);Pain Pain - part of body:  (abdomen)    Time: 1610-9604 PT Time Calculation (min) (ACUTE ONLY): 17 min   Charges:   PT Evaluation $PT Eval Low Complexity: 1 Low   PT General Charges $$ ACUTE PT VISIT: 1 Visit         [facility] PT Acute Rehabilitation Services Office [phone]   Angelina Ok [facility] 07/08/2023, 5:07 PM

## 2023-07-08 NOTE — Progress Notes (Addendum)
9Subjective: Postpartum Day 9: Cesarean Delivery Patient reports feeling better. She is tolerating PO, + flatus, + BM, and no problems voiding.    Objective: Vital signs in last 24 hours: Temp:  [98.4 F (36.9 C)-99.4 F (37.4 C)] 99.2 F (37.3 C) (10/07 1347) Pulse Rate:  [87-103] 97 (10/07 1347) Resp:  [16-20] 20 (10/07 1347) BP: (138-159)/(80-81) 138/80 (10/07 1347) SpO2:  [93 %-98 %] 95 % (10/07 1347)  Physical Exam:  General: alert, cooperative, and no distress. CVS: S1, S 2, Regular rate and rhythm. Pulmonary: Clear to auscultation bilaterally.  Abdomen: Soft, moderately distended (less then before), with normal bowel sounds on all four quadrants.  Lochia: appropriate. Uterine Fundus: firm. Incision: With dressing, clean, dry and intact.  DVT Evaluation: No evidence of DVT seen on physical exam. Calf/Ankle edema is present.  Recent Labs    07/07/23 0405 07/08/23 0852  HGB 9.6* 8.9*  HCT 32.6* 29.1*    CBC    Component Value Date/Time   WBC 14.5 (H) 07/08/2023 0852   RBC 3.60 (L) 07/08/2023 0852   HGB 8.9 (L) 07/08/2023 0852   HCT 29.1 (L) 07/08/2023 0852   PLT 265 07/08/2023 0852   MCV 80.8 07/08/2023 0852   MCH 24.7 (L) 07/08/2023 0852   MCHC 30.6 07/08/2023 0852   RDW 27.7 (H) 07/08/2023 0852   LYMPHSABS 0.8 07/06/2023 0456   MONOABS 0.9 07/06/2023 0456   EOSABS 0.0 07/06/2023 0456   BASOSABS 0.0 07/06/2023 0456    CMP     Component Value Date/Time   NA 143 07/08/2023 0852   K 4.3 07/08/2023 0852   CL 111 07/08/2023 0852   CO2 20 (L) 07/08/2023 0852   GLUCOSE 95 07/08/2023 0852   BUN 8 07/08/2023 0852   CREATININE 0.72 07/08/2023 0852   CALCIUM 7.2 (L) 07/08/2023 0852   PROT 5.1 (L) 07/07/2023 0405   ALBUMIN 1.7 (L) 07/07/2023 0405   AST 12 (L) 07/07/2023 0405   ALT 10 07/07/2023 0405   ALKPHOS 60 07/07/2023 0405   BILITOT 0.5 07/07/2023 0405   GFRNONAA >60 07/08/2023 0852    CBG (last 3)  Recent Labs    07/08/23 0843 07/08/23 1245  07/08/23 1648  GLUCAP 117* 141* 115*     Current Facility-Administered Medications:    0.9 %  sodium chloride infusion, , Intravenous, PRN, Osborn Coho, MD, Last Rate: 10 mL/hr at 07/06/23 1214, New Bag at 07/06/23 1214   albuterol (PROVENTIL) (2.5 MG/3ML) 0.083% nebulizer solution 3 mL, 3 mL, Inhalation, Q6H PRN, Regalado, Belkys A, MD   amoxicillin-clavulanate (AUGMENTIN) 875-125 MG per tablet 1 tablet, 1 tablet, Oral, Q12H, Regalado, Belkys A, MD, 1 tablet at 07/08/23 1022   budesonide (PULMICORT) nebulizer solution 0.25 mg, 0.25 mg, Nebulization, BID, Osborn Coho, MD, 0.25 mg at 07/08/23 0751   coconut oil, 1 Application, Topical, PRN, Osborn Coho, MD   witch hazel-glycerin (TUCKS) pad 1 Application, 1 Application, Topical, PRN **AND** dibucaine (NUPERCAINAL) 1 % rectal ointment 1 Application, 1 Application, Rectal, PRN, Osborn Coho, MD   diphenhydrAMINE (BENADRYL) capsule 25 mg, 25 mg, Oral, Q6H PRN, Osborn Coho, MD   enoxaparin (LOVENOX) injection 60 mg, 60 mg, Subcutaneous, Q24H, White, Christopher M, MD, 60 mg at 07/07/23 2254   guaiFENesin (MUCINEX) 12 hr tablet 1,200 mg, 1,200 mg, Oral, BID, Regalado, Belkys A, MD, 1,200 mg at 07/08/23 1031   guaiFENesin (ROBITUSSIN) 100 MG/5ML liquid 10 mL, 10 mL, Oral, Q4H PRN, Osborn Coho, MD, 10 mL at 07/08/23 0503  hydrALAZINE (APRESOLINE) injection 5 mg, 5 mg, Intravenous, PRN, 5 mg at 07/05/23 1655 **AND** hydrALAZINE (APRESOLINE) injection 10 mg, 10 mg, Intravenous, PRN **AND** labetalol (NORMODYNE) injection 20 mg, 20 mg, Intravenous, PRN **AND** labetalol (NORMODYNE) injection 40 mg, 40 mg, Intravenous, PRN **AND** [COMPLETED] Measure blood pressure, , , Once, Connye Burkitt, Melissa, DO   HYDROmorphone (DILAUDID) injection 1 mg, 1 mg, Intravenous, Q2H PRN, Osborn Coho, MD   insulin aspart (novoLOG) injection 0-15 Units, 0-15 Units, Subcutaneous, Q4H, Osborn Coho, MD, 2 Units at 07/08/23 1324   ketorolac (TORADOL) 30  MG/ML injection 30 mg, 30 mg, Intravenous, Q6H, 30 mg at 07/08/23 1601 **OR** ketorolac (TORADOL) 30 MG/ML injection 30 mg, 30 mg, Intramuscular, Q6H, Osborn Coho, MD   naloxone HCl (NARCAN) 2 mg in dextrose 5 % 250 mL infusion, 1-2 mcg/kg/hr, Intravenous, Continuous PRN, Finucane, Elizabeth M, DO   NIFEdipine (PROCARDIA-XL/NIFEDICAL-XL) 24 hr tablet 30 mg, 30 mg, Oral, Daily, Osborn Coho, MD, 30 mg at 07/08/23 1004   pantoprazole (PROTONIX) EC tablet 40 mg, 40 mg, Oral, BID AC, Regalado, Belkys A, MD, 40 mg at 07/08/23 1706   prenatal multivitamin tablet 1 tablet, 1 tablet, Oral, Q1200, Osborn Coho, MD, 1 tablet at 07/08/23 1324   senna-docusate (Senokot-S) tablet 2 tablet, 2 tablet, Oral, Daily, Osborn Coho, MD   simethicone [facility]) chewable tablet 80 mg, 80 mg, Oral, PRN, Osborn Coho, MD   zolpidem (AMBIEN) tablet 5 mg, 5 mg, Oral, QHS PRN, Osborn Coho, MD   Assessment/Plan: 37 y/o [phone] POD # 9 s/p Primary C/section for twins, post op course significant for small bowel obstructions and patient is now  POD 2 s/p  Exploratory laparotomy with right hemicolectomy for Ischemic wall of cecum without perforation or abscess.    - Advance diet as per surgery recommendations, patient is currently tolerating a full liquid diet.  - With Rhinovirus infection and pneumonia, s/p cefepime and flagyl and now on oral augmentin as per hospitalist. Continue with droplet precautions.  - With type 2 DM, c/w glucose checks and insulin sliding scale.  - Will restart iron tabs for anemia when tolerating a regular diet.  - Lovenox for DVT prophylaxis.  Prescilla Sours, MD 07/08/2023, 6:00 PM

## 2023-07-08 NOTE — Progress Notes (Signed)
2 Days Post-Op  Subjective: CC: No abdominal pain except "gas pain" if she tries to hold back flatus. Off PCA. No PRN pain meds over the last 24 hours. Tolerating cld without n/v. Passing flatus. Non-bloody bm this am. Foley out and voiding. Mobilizing.   Afebrile. Tachycardia resolved. No hypotension. No labs done today.   Objective: Vital signs in last 24 hours: Temp:  [97.7 F (36.5 C)-99.4 F (37.4 C)] 98.6 F (37 C) (10/07 0422) Pulse Rate:  [90-120] 90 (10/07 0422) Resp:  [16-20] 16 (10/07 0422) BP: (145-159)/(78-86) 156/81 (10/07 0422) SpO2:  [93 %-100 %] 96 % (10/07 0422)    Intake/Output from previous day: 10/06 0701 - 10/07 0700 In: 3851.1 [P.O.:960; I.V.:1917; IV Piggyback:974.1] Out: 1625 [Urine:1525] Intake/Output this shift: No intake/output data recorded.  PE: Gen:  Alert, NAD, pleasant Abd: Soft, improved mild distension, appropriately ttp around incisions, no rigidity or guarding, +BS, midline and pfannenstiel incision with honeycomb dressings in place, cdi.   Lab Results:  Recent Labs    07/06/23 0456 07/07/23 0405  WBC 11.4* 14.6*  HGB 9.4* 9.6*  HCT 30.7* 32.6*  PLT 300 352   BMET Recent Labs    07/07/23 1358 07/07/23 1713  NA 153* 149*  K 3.9 3.5  CL 113* 113*  CO2 25 24  GLUCOSE 125* 139*  BUN 17 13  CREATININE 0.86 0.85  CALCIUM 7.5* 7.6*   PT/INR No results for input(s): "LABPROT", "INR" in the last 72 hours.  CMP     Component Value Date/Time   NA 149 (H) 07/07/2023 1713   K 3.5 07/07/2023 1713   CL 113 (H) 07/07/2023 1713   CO2 24 07/07/2023 1713   GLUCOSE 139 (H) 07/07/2023 1713   BUN 13 07/07/2023 1713   CREATININE 0.85 07/07/2023 1713   CALCIUM 7.6 (L) 07/07/2023 1713   PROT 5.1 (L) 07/07/2023 0405   ALBUMIN 1.7 (L) 07/07/2023 0405   AST 12 (L) 07/07/2023 0405   ALT 10 07/07/2023 0405   ALKPHOS 60 07/07/2023 0405   BILITOT 0.5 07/07/2023 0405   GFRNONAA >60 07/07/2023 1713   GFRAA >60 03/31/2019 0210    Lipase     Component Value Date/Time   LIPASE 27 09/09/2017 1803    Studies/Results: DG Abd 1 View  Result Date: 07/06/2023 CLINICAL DATA:  [id number].  Check NG tube placement. EXAM: ABDOMEN - 1 VIEW COMPARISON:  Flat plate portable abdomen exam yesterday at 8:26 p.m., CT with IV contrast 07/04/2023. FINDINGS: The bowel gas pattern is obstructive and mildly improved. Small bowel is dilated up to 4.8 cm previously 6 cm. NGT is in place the tip in the distal body of stomach. There are overlying skin staples. There is no supine evidence of free air. No radio-opaque calculi or other significant radiographic abnormality are seen. IMPRESSION: 1. The bowel gas pattern is obstructive but mildly improved. 2. NGT tip in the distal body of the stomach. Electronically Signed   By: Almira Bar M.D.   On: 07/06/2023 23:40   ECHOCARDIOGRAM COMPLETE  Result Date: 07/06/2023    ECHOCARDIOGRAM REPORT   Patient Name:   Erika Lewis Date of Exam: 07/06/2023 Medical Rec #:  [id number]        Height:       64.0 in Accession #:    [id number]       Weight:       254.1 lb Date of Birth:  09-Oct-1985        BSA:  2.166 m Patient Age:    36 years         BP:           151/76 mmHg Patient Gender: F                HR:           103 bpm. Exam Location:  Inpatient Procedure: 2D Echo, Color Doppler and Cardiac Doppler Indications:    cardiomegaly  History:        Patient has no prior history of Echocardiogram examinations.                 Status post cesarean section. Sickle cell disease.; Risk                 Factors:Hypertension and Diabetes.  Sonographer:    [name] RDCS Referring Phys: [id number] ANGELA ROBERTS  Sonographer Comments: Patient is obese. Image acquisition challenging due to patient body habitus. IMPRESSIONS  1. Left ventricular ejection fraction, by estimation, is 60 to 65%. The left ventricle has normal function. The left ventricle has no regional wall motion abnormalities. Left ventricular  diastolic parameters are indeterminate.  2. Right ventricular systolic function is normal. The right ventricular size is normal.  3. The mitral valve is normal in structure. No evidence of mitral valve regurgitation. No evidence of mitral stenosis.  4. The aortic valve is tricuspid. Aortic valve regurgitation is not visualized. No aortic stenosis is present.  5. The inferior vena cava is normal in size with greater than 50% respiratory variability, suggesting right atrial pressure of 3 mmHg. FINDINGS  Left Ventricle: Left ventricular ejection fraction, by estimation, is 60 to 65%. The left ventricle has normal function. The left ventricle has no regional wall motion abnormalities. The left ventricular internal cavity size was normal in size. There is  no left ventricular hypertrophy. Left ventricular diastolic parameters are indeterminate. Right Ventricle: The right ventricular size is normal. No increase in right ventricular wall thickness. Right ventricular systolic function is normal. Left Atrium: Left atrial size was normal in size. Right Atrium: Right atrial size was normal in size. Pericardium: Trivial pericardial effusion is present. The pericardial effusion is posterior to the left ventricle. Mitral Valve: The mitral valve is normal in structure. No evidence of mitral valve regurgitation. No evidence of mitral valve stenosis. Tricuspid Valve: The tricuspid valve is normal in structure. Tricuspid valve regurgitation is trivial. No evidence of tricuspid stenosis. Aortic Valve: The aortic valve is tricuspid. Aortic valve regurgitation is not visualized. No aortic stenosis is present. Pulmonic Valve: The pulmonic valve was normal in structure. Pulmonic valve regurgitation is trivial. No evidence of pulmonic stenosis. Aorta: The aortic root is normal in size and structure. Venous: The inferior vena cava is normal in size with greater than 50% respiratory variability, suggesting right atrial pressure of 3 mmHg.  IAS/Shunts: The interatrial septum was not well visualized.  LEFT VENTRICLE PLAX 2D LVIDd:         4.20 cm LVIDs:         2.80 cm LV PW:         1.20 cm LV IVS:        1.10 cm LVOT diam:     1.90 cm LV SV:         66 LV SV Index:   31 LVOT Area:     2.84 cm  RIGHT VENTRICLE             IVC RV Basal  diam:  2.20 cm     IVC diam: 1.40 cm RV S prime:     11.60 cm/s TAPSE (M-mode): 2.0 cm LEFT ATRIUM             Index        RIGHT ATRIUM           Index LA diam:        3.10 cm 1.43 cm/m   RA Area:     15.10 cm LA Vol (A2C):   38.5 ml 17.78 ml/m  RA Volume:   35.30 ml  16.30 ml/m LA Vol (A4C):   50.4 ml 23.27 ml/m LA Biplane Vol: 45.0 ml 20.78 ml/m  AORTIC VALVE LVOT Vmax:   143.00 cm/s LVOT Vmean:  94.700 cm/s LVOT VTI:    0.233 m  AORTA Ao Root diam: 2.60 cm Ao Asc diam:  2.70 cm  SHUNTS Systemic VTI:  0.23 m Systemic Diam: 1.90 cm Charlton Haws MD Electronically signed by Charlton Haws MD Signature Date/Time: 07/06/2023/3:50:31 PM    Final    PERIPHERAL VASCULAR CATHETERIZATION  Result Date: 07/06/2023 See surgical note for result.   Anti-infectives: Anti-infectives (From admission, onward)    Start     Dose/Rate Route Frequency Ordered Stop   07/05/23 0945  metroNIDAZOLE (FLAGYL) IVPB 500 mg        500 mg 100 mL/hr over 60 Minutes Intravenous Every 12 hours 07/05/23 0846     07/04/23 2100  metroNIDAZOLE (FLAGYL) IVPB 500 mg  Status:  Discontinued        500 mg 100 mL/hr over 60 Minutes Intravenous Every 8 hours 07/04/23 1923 07/05/23 0125   07/04/23 2000  ceFEPIme (MAXIPIME) 2 g in sodium chloride 0.9 % 100 mL IVPB        2 g 200 mL/hr over 30 Minutes Intravenous Every 8 hours 07/04/23 1923     07/03/23 0917  Ampicillin-Sulbactam (UNASYN) 3 g in sodium chloride 0.9 % 100 mL IVPB  Status:  Discontinued        3 g 200 mL/hr over 30 Minutes Intravenous Every 6 hours 07/03/23 0917 07/04/23 0943   06/29/23 0909  azithromycin (ZITHROMAX) 500 mg in sodium chloride 0.9 % 250 mL IVPB  Status:   Discontinued        500 mg 250 mL/hr over 60 Minutes Intravenous 60 min pre-op 06/29/23 0909 06/29/23 1547   06/29/23 0909  ceFAZolin (ANCEF) IVPB 2g/100 mL premix        2 g 200 mL/hr over 30 Minutes Intravenous 30 min pre-op 06/29/23 1610 06/29/23 1135        Assessment/Plan Hx of C-section 9/28 POD 2 s/p  Exploratory laparotomy with 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 Cliffton Asters on 07/06/23 - Adv to FLD this am. ADAT to Soft.  - Mobilize - Pulm toilet  FEN - FLD. ADAT to soft. IVF per primary  VTE - SCDs, Lovenox ID - On Cefepime/Flagyl for PNA. Does not need any further abx from our standpoint. ,  Foley - out, voiding.     LOS: 9 days    Jacinto Halim , [facility] 07/08/2023, 7:46 AM Please see Amion for pager number during day hours 7:00am-4:30pm

## 2023-07-08 NOTE — Progress Notes (Signed)
PROGRESS NOTE    Erika Lewis  ZOX:[id number] DOB: 05-24-1986 DOA: 06/29/2023 PCP: Nigel Bridgeman, CNM   Brief Narrative: 37 year old with past medical history significant for diabetes type 2, hypertension, asthma, sickle cell trait underwent C-section and salpingectomy on 9/28.  Postoperative patient had postpartum hemorrhage.  She was transfused and received IV iron.  She developed abdominal distention, nausea and vomiting.  CT abdomen and pelvis showed marked fluid distention of the stomach and distended small bowel loop.  Extensive bilateral consolidation in the lungs.  Negative for fascial dehiscence.  Hospitalist consulted for management of pneumonia and medical problems. Post surgery patient develop SBO versus ileus, pneumonia, also found to have Rhinovirus   Assessment & Plan:   Principal Problem:   Chronic hypertension affecting pregnancy Active Problems:   S/P cesarean section   Acute postoperative anemia due to greater than expected blood loss   Postpartum hemorrhage   Rhinovirus   RSV (respiratory syncytial virus pneumonia)  Thank you for consult, will continue to follow with you  1-Pneumonia: Aspiration   pneumonia, Rhinovirus.  Follow blood cultures: No growth to date Lactic acid negative She was treated with Cefepime and Flagyl for 5 days. Change antibiotics to oral today Augemntin for 2 days.  Oxygen sat stable on 2 L.   SBO, Ischemic wall of cecum without perforation or abscess.  Underwent S/P exploratory laparotomy with right hemicolectomy 10/05. Surgery Following NG tube placed on 10/03 yielding 900 cc Management per Sx.  Replete electrolytes.  Surgery recommend full liquid diet today.  Had BM.   Hypokalemia: Replaced.   Hypernatremia:  Resolved with D 5. IV bolus  She is now drinking and eating. Will Discontinue fluids.   Hypoalbuminemia She will need supplement when tolerating oral. .   AKI Improved.  Voiding trial.   Mild Pulmonary  congestion Cardiomegaly w/ mild congestion noted on CXR.  IV Lasix x 1 given.  ECHO Normal EF  BNP 31.  DM type 2: Continue with SSI  Chronic HTN, affecting pregnancy PRN Hydralazine, labetalol.  Resume nifedipine,   Asthma: Pulmicort. PRN albuterol.  She prefer albuterol Inhaler, change it   Post Partum Twins. PPH S/P C section and PPH/  Per GYN OB Hb stable.      Estimated body mass index is 43.62 kg/m as calculated from the following:   Height as of this encounter: 5\' 4"  (1.626 m).   Weight as of this encounter: 115.3 kg.   DVT prophylaxis: SCD Code Status: Full code Family Communication: Care discussed with patient Disposition Plan:  Status is: Inpatient Remains inpatient appropriate because: discharge when clear by Sx, OBY   Antimicrobials:    Subjective: She report cough, thick phlegm. Will add Guaifenesin. Cough is not getting worse.  She has been tolerating clear liquid diet.   Objective: Vitals:   07/07/23 2116 07/07/23 2214 07/08/23 0009 07/08/23 0422  BP:   (!) 155/81 (!) 156/81  Pulse:   100 90  Resp:   18 16  Temp:   99.4 F (37.4 C) 98.6 F (37 C)  TempSrc:   Oral Oral  SpO2: 98% 97% 93% 96%  Weight:      Height:        Intake/Output Summary (Last 24 hours) at 07/08/2023 0900 Last data filed at 07/08/2023 0500 Gross per 24 hour  Intake 3851.09 ml  Output 1500 ml  Net 2351.09 ml   Filed Weights   06/29/23 0940  Weight: 115.3 kg    Examination:  General exam:  NAD Respiratory system: CTA Cardiovascular system: S 1, S 2 RRR Gastrointestinal system: BS , midline incision with staples, honeycomb dressing.  Central nervous system: Alert, conversant Extremities: No edema    Data Reviewed: I have personally reviewed following labs and imaging studies  CBC: Recent Labs  Lab 07/03/23 0610 07/04/23 2020 07/05/23 0203 07/06/23 0456 07/07/23 0405  WBC 14.2* 10.2 8.1 11.4* 14.6*  NEUTROABS 12.6* 8.2* 6.6 9.7*  --   HGB 10.4*  9.1* 9.4* 9.4* 9.6*  HCT 33.1* 28.8* 29.9* 30.7* 32.6*  MCV 77.9* 79.6* 77.3* 78.9* 80.9  PLT 209 340 266 300 352   Basic Metabolic Panel: Recent Labs  Lab 07/05/23 0203 07/06/23 0456 07/07/23 0405 07/07/23 1358 07/07/23 1713  NA 144 148* 151* 153* 149*  K 3.0* 2.9* 3.3* 3.9 3.5  CL 101 107 112* 113* 113*  CO2 25 28 27 25 24   GLUCOSE 149* 126* 112* 125* 139*  BUN 20 19 20 17 13   CREATININE 1.12* 0.80 0.92 0.86 0.85  CALCIUM 8.1* 8.2* 7.5* 7.5* 7.6*  MG  --  2.7*  --   --   --    GFR: Estimated Creatinine Clearance: 114 mL/min (by C-G formula based on SCr of 0.85 mg/dL). Liver Function Tests: Recent Labs  Lab 07/04/23 1101 07/04/23 2020 07/05/23 0203 07/06/23 0456 07/07/23 0405  AST 21 12* 12* 15 12*  ALT 15 17 15 13 10   ALKPHOS 69 73 58 65 60  BILITOT 0.8 0.9 0.1* 0.6 0.5  PROT 5.3* 5.6* 5.0* 5.4* 5.1*  ALBUMIN 1.9* 1.9* 1.7* 1.8* 1.7*   No results for input(s): "LIPASE", "AMYLASE" in the last 168 hours. No results for input(s): "AMMONIA" in the last 168 hours. Coagulation Profile: Recent Labs  Lab 07/04/23 2020  INR 1.2   Cardiac Enzymes: No results for input(s): "CKTOTAL", "CKMB", "CKMBINDEX", "TROPONINI" in the last 168 hours. BNP (last 3 results) No results for input(s): "PROBNP" in the last 8760 hours. HbA1C: No results for input(s): "HGBA1C" in the last 72 hours.  CBG: Recent Labs  Lab 07/07/23 1617 07/07/23 2038 07/08/23 0012 07/08/23 0426 07/08/23 0843  GLUCAP 146* 165* 130* 135* 117*   Lipid Profile: No results for input(s): "CHOL", "HDL", "LDLCALC", "TRIG", "CHOLHDL", "LDLDIRECT" in the last 72 hours. Thyroid Function Tests: No results for input(s): "TSH", "T4TOTAL", "FREET4", "T3FREE", "THYROIDAB" in the last 72 hours. Anemia Panel: No results for input(s): "VITAMINB12", "FOLATE", "FERRITIN", "TIBC", "IRON", "RETICCTPCT" in the last 72 hours. Sepsis Labs: Recent Labs  Lab 07/04/23 2020 07/04/23 2226  LATICACIDVEN 1.9 1.5     Recent Results (from the past 240 hour(s))  Culture, OB Urine     Status: Abnormal   Collection Time: 07/03/23  5:57 AM   Specimen: Urine, Random  Result Value Ref Range Status   Specimen Description URINE, RANDOM  Final   Special Requests NONE  Final   Culture (A)  Final    MULTIPLE SPECIES PRESENT, SUGGEST RECOLLECTION NO GROUP B STREP (S.AGALACTIAE) ISOLATED Performed at [facility], [address] [place], [place] [address]    Report Status 07/04/2023 FINAL  Final  Respiratory (~20 pathogens) panel by PCR     Status: Abnormal   Collection Time: 07/04/23  4:30 AM   Specimen: Nasopharyngeal Swab; Respiratory  Result Value Ref Range Status   Adenovirus NOT DETECTED NOT DETECTED Final   Coronavirus 229E NOT DETECTED NOT DETECTED Final    Comment: (NOTE) The Coronavirus on the Respiratory Panel, DOES NOT test for the  novel  Coronavirus (2019 nCoV)    Coronavirus HKU1 NOT DETECTED NOT DETECTED Final   Coronavirus NL63 NOT DETECTED NOT DETECTED Final   Coronavirus OC43 NOT DETECTED NOT DETECTED Final   Metapneumovirus NOT DETECTED NOT DETECTED Final   Rhinovirus / Enterovirus DETECTED (A) NOT DETECTED Final   Influenza A NOT DETECTED NOT DETECTED Final   Influenza B NOT DETECTED NOT DETECTED Final   Parainfluenza Virus 1 NOT DETECTED NOT DETECTED Final   Parainfluenza Virus 2 NOT DETECTED NOT DETECTED Final   Parainfluenza Virus 3 NOT DETECTED NOT DETECTED Final   Parainfluenza Virus 4 NOT DETECTED NOT DETECTED Final   Respiratory Syncytial Virus NOT DETECTED NOT DETECTED Final   Bordetella pertussis NOT DETECTED NOT DETECTED Final   Bordetella Parapertussis NOT DETECTED NOT DETECTED Final   Chlamydophila pneumoniae NOT DETECTED NOT DETECTED Final   Mycoplasma pneumoniae NOT DETECTED NOT DETECTED Final    Comment: Performed at [facility], [address] [place], [place] [address]  Culture, blood (x 2)     Status: None (Preliminary result)   Collection  Time: 07/04/23  8:20 PM   Specimen: BLOOD RIGHT ARM  Result Value Ref Range Status   Specimen Description BLOOD RIGHT ARM  Final   Special Requests   Final    BOTTLES DRAWN AEROBIC AND ANAEROBIC Blood Culture adequate volume   Culture   Final    NO GROWTH 4 DAYS Performed at [facility], [address], [place], [place] [address]    Report Status PENDING  Incomplete  Culture, blood (x 2)     Status: None (Preliminary result)   Collection Time: 07/04/23  8:21 PM   Specimen: BLOOD RIGHT HAND  Result Value Ref Range Status   Specimen Description BLOOD RIGHT HAND  Final   Special Requests   Final    BOTTLES DRAWN AEROBIC AND ANAEROBIC Blood Culture adequate volume   Culture   Final    NO GROWTH 4 DAYS Performed at [facility], [address] [place], [place] [address]    Report Status PENDING  Incomplete         Radiology Studies: DG Abd 1 View  Result Date: 07/06/2023 CLINICAL DATA:  [id number].  Check NG tube placement. EXAM: ABDOMEN - 1 VIEW COMPARISON:  Flat plate portable abdomen exam yesterday at 8:26 p.m., CT with IV contrast 07/04/2023. FINDINGS: The bowel gas pattern is obstructive and mildly improved. Small bowel is dilated up to 4.8 cm previously 6 cm. NGT is in place the tip in the distal body of stomach. There are overlying skin staples. There is no supine evidence of free air. No radio-opaque calculi or other significant radiographic abnormality are seen. IMPRESSION: 1. The bowel gas pattern is obstructive but mildly improved. 2. NGT tip in the distal body of the stomach. Electronically Signed   By: Almira Bar M.D.   On: 07/06/2023 23:40   ECHOCARDIOGRAM COMPLETE  Result Date: 07/06/2023    ECHOCARDIOGRAM REPORT   Patient Name:   KELSI BENHAM Date of Exam: 07/06/2023 Medical Rec #:  [id number]        Height:       64.0 in Accession #:    [id number]       Weight:       254.1 lb Date of Birth:  08/29/1986        BSA:          2.166 m Patient Age:    63  years         BP:           151/76 mmHg Patient Gender: F                HR:           103 bpm. Exam Location:  Inpatient Procedure: 2D Echo, Color Doppler and Cardiac Doppler Indications:    cardiomegaly  History:        Patient has no prior history of Echocardiogram examinations.                 Status post cesarean section. Sickle cell disease.; Risk                 Factors:Hypertension and Diabetes.  Sonographer:    [name] RDCS Referring Phys: [id number] ANGELA ROBERTS  Sonographer Comments: Patient is obese. Image acquisition challenging due to patient body habitus. IMPRESSIONS  1. Left ventricular ejection fraction, by estimation, is 60 to 65%. The left ventricle has normal function. The left ventricle has no regional wall motion abnormalities. Left ventricular diastolic parameters are indeterminate.  2. Right ventricular systolic function is normal. The right ventricular size is normal.  3. The mitral valve is normal in structure. No evidence of mitral valve regurgitation. No evidence of mitral stenosis.  4. The aortic valve is tricuspid. Aortic valve regurgitation is not visualized. No aortic stenosis is present.  5. The inferior vena cava is normal in size with greater than 50% respiratory variability, suggesting right atrial pressure of 3 mmHg. FINDINGS  Left Ventricle: Left ventricular ejection fraction, by estimation, is 60 to 65%. The left ventricle has normal function. The left ventricle has no regional wall motion abnormalities. The left ventricular internal cavity size was normal in size. There is  no left ventricular hypertrophy. Left ventricular diastolic parameters are indeterminate. Right Ventricle: The right ventricular size is normal. No increase in right ventricular wall thickness. Right ventricular systolic function is normal. Left Atrium: Left atrial size was normal in size. Right Atrium: Right atrial size was normal in size. Pericardium: Trivial pericardial effusion is present. The  pericardial effusion is posterior to the left ventricle. Mitral Valve: The mitral valve is normal in structure. No evidence of mitral valve regurgitation. No evidence of mitral valve stenosis. Tricuspid Valve: The tricuspid valve is normal in structure. Tricuspid valve regurgitation is trivial. No evidence of tricuspid stenosis. Aortic Valve: The aortic valve is tricuspid. Aortic valve regurgitation is not visualized. No aortic stenosis is present. Pulmonic Valve: The pulmonic valve was normal in structure. Pulmonic valve regurgitation is trivial. No evidence of pulmonic stenosis. Aorta: The aortic root is normal in size and structure. Venous: The inferior vena cava is normal in size with greater than 50% respiratory variability, suggesting right atrial pressure of 3 mmHg. IAS/Shunts: The interatrial septum was not well visualized.  LEFT VENTRICLE PLAX 2D LVIDd:         4.20 cm LVIDs:         2.80 cm LV PW:         1.20 cm LV IVS:        1.10 cm LVOT diam:     1.90 cm LV SV:         66 LV SV Index:   31 LVOT Area:     2.84 cm  RIGHT VENTRICLE             IVC RV Basal diam:  2.20 cm     IVC  diam: 1.40 cm RV S prime:     11.60 cm/s TAPSE (M-mode): 2.0 cm LEFT ATRIUM             Index        RIGHT ATRIUM           Index LA diam:        3.10 cm 1.43 cm/m   RA Area:     15.10 cm LA Vol (A2C):   38.5 ml 17.78 ml/m  RA Volume:   35.30 ml  16.30 ml/m LA Vol (A4C):   50.4 ml 23.27 ml/m LA Biplane Vol: 45.0 ml 20.78 ml/m  AORTIC VALVE LVOT Vmax:   143.00 cm/s LVOT Vmean:  94.700 cm/s LVOT VTI:    0.233 m  AORTA Ao Root diam: 2.60 cm Ao Asc diam:  2.70 cm  SHUNTS Systemic VTI:  0.23 m Systemic Diam: 1.90 cm Charlton Haws MD Electronically signed by Charlton Haws MD Signature Date/Time: 07/06/2023/3:50:31 PM    Final    PERIPHERAL VASCULAR CATHETERIZATION  Result Date: 07/06/2023 See surgical note for result.       Scheduled Meds:  budesonide  0.25 mg Nebulization BID   enoxaparin (LOVENOX) injection  60 mg  Subcutaneous Q24H   insulin aspart  0-15 Units Subcutaneous Q4H   ketorolac  30 mg Intravenous Q6H   Or   ketorolac  30 mg Intramuscular Q6H   NIFEdipine  30 mg Oral Daily   pantoprazole (PROTONIX) IV  40 mg Intravenous Q12H   prenatal multivitamin  1 tablet Oral Q1200   senna-docusate  2 tablet Oral Daily   simethicone  80 mg Oral TID PC   Continuous Infusions:  sodium chloride 10 mL/hr at 07/06/23 1214   ceFEPime (MAXIPIME) IV 2 g (07/08/23 0420)   dextrose 125 mL/hr at 07/08/23 0508   metronidazole 500 mg (07/07/23 2251)   naloxone HCl (NARCAN) 2 mg in dextrose 5 % 250 mL infusion       LOS: 9 days    Time spent: 35 minutes    Faelyn Sigler A Rainey Rodger, MD [facility]   If 7PM-7AM, please contact night-coverage [email or web address]  07/08/2023, 9:00 AM

## 2023-07-08 NOTE — Lactation Note (Signed)
This note was copied from a baby's chart.  NICU Lactation Consultation Note  Patient Name: Erika Lewis [id number] Date: 07/08/2023 Age:38 days  Reason for consult: Follow-up assessment; NICU baby; Multiple gestation; Late-preterm 34-36.6wks; Maternal endocrine disorder Type of Endocrine Disorder?: Diabetes Maternal pneumonia Maternal exploratory abdominal surgery 10/5  SUBJECTIVE  LC visited with 3 Mom of LPT twins.  Mom in recliner with baby B sleeping on her chest.  Mom states she hasn't expressed any milk yet, but admits to not pumping but once yesterday.  Breasts are soft and not engorged.   Baby girl A is now feeding ad lib. LC asked Mom if there was anything LC could do to help her and she stated no thank you. Mom to ask for assistance from lactation consultant prn  OBJECTIVE Infant data: Mother's Current Feeding Choice: Breast Milk and Formula  O2 Device: Room Air  Infant feeding assessment Scale for Readiness: 2 Scale for Quality: 2   Maternal data: [id number] C-Section, Low Transverse Pumping frequency: not consistent, Mom not feeling well Pumped volume: 0 mL Flange Size: 21  Pump: Personal, DEBP  Feeding Status: Ad lib Feeding method: Bottle Nipple Type: Nfant Slow Flow (purple)  Maternal: Milk volume: Low  INTERVENTIONS/PLAN Interventions: Interventions: Skin to skin; Breast massage; Hand express; DEBP; Education Tools: Pump; Flanges; Bottle Pump Education: Setup, frequency, and cleaning; Milk Storage  Plan: Consult Status: NICU follow-up NICU Follow-up type: Weekly NICU follow up; Verify onset of copious milk; Verify absence of engorgement; Maternal D/C visit   Erika Lewis 07/08/2023, 11:53 AM

## 2023-07-09 DIAGNOSIS — J121 Respiratory syncytial virus pneumonia: Secondary | ICD-10-CM | POA: Diagnosis not present

## 2023-07-09 DIAGNOSIS — K567 Ileus, unspecified: Secondary | ICD-10-CM | POA: Diagnosis not present

## 2023-07-09 LAB — GLUCOSE, CAPILLARY
Glucose-Capillary: 126 mg/dL — ABNORMAL HIGH (ref 70–99)
Glucose-Capillary: 101 mg/dL — ABNORMAL HIGH (ref 70–99)
Glucose-Capillary: 144 mg/dL — ABNORMAL HIGH (ref 70–99)
Glucose-Capillary: 144 mg/dL — ABNORMAL HIGH (ref 70–99)
Glucose-Capillary: 107 mg/dL — ABNORMAL HIGH (ref 70–99)

## 2023-07-09 LAB — CBC
HCT: 29.2 % — ABNORMAL LOW (ref 36.0–46.0)
Hemoglobin: 9.1 g/dL — ABNORMAL LOW (ref 12.0–15.0)
MCH: 24 pg — ABNORMAL LOW (ref 26.0–34.0)
MCHC: 31.2 g/dL (ref 30.0–36.0)
MCV: 77 fL — ABNORMAL LOW (ref 80.0–100.0)
Platelets: 334 10*3/uL (ref 150–400)
RBC: 3.79 MIL/uL — ABNORMAL LOW (ref 3.87–5.11)
RDW: 27 % — ABNORMAL HIGH (ref 11.5–15.5)
WBC: 12.4 10*3/uL — ABNORMAL HIGH (ref 4.0–10.5)
nRBC: 0.2 % (ref 0.0–0.2)

## 2023-07-09 LAB — BASIC METABOLIC PANEL WITH GFR
Anion gap: 9 (ref 5–15)
BUN: <5 mg/dL — ABNORMAL LOW (ref 6–20)
CO2: 22 mmol/L (ref 22–32)
Calcium: 7.6 mg/dL — ABNORMAL LOW (ref 8.9–10.3)
Chloride: 105 mmol/L (ref 98–111)
Creatinine, Ser: 0.62 mg/dL (ref 0.44–1.00)
GFR, Estimated: >60 mL/min
Glucose, Bld: 127 mg/dL — ABNORMAL HIGH (ref 70–99)
Potassium: 3.1 mmol/L — ABNORMAL LOW (ref 3.5–5.1)
Sodium: 136 mmol/L (ref 135–145)

## 2023-07-09 LAB — CULTURE, BLOOD (ROUTINE X 2)
Culture: NO GROWTH
Culture: NO GROWTH
Special Requests: ADEQUATE
Special Requests: ADEQUATE

## 2023-07-09 LAB — SURGICAL PATHOLOGY

## 2023-07-09 MED ORDER — IBUPROFEN 600 MG PO TABS
600.0000 mg | ORAL_TABLET | Freq: Four times a day (QID) | ORAL | Status: DC | PRN
  Administered 2023-07-09: 600 mg via ORAL
  Filled 2023-07-09: qty 1

## 2023-07-09 MED ORDER — NIFEDIPINE ER OSMOTIC RELEASE 60 MG PO TB24
60.0000 mg | ORAL_TABLET | Freq: Every day | ORAL | Status: DC
  Administered 2023-07-10: 60 mg via ORAL
  Filled 2023-07-09: qty 1

## 2023-07-09 MED ORDER — NIFEDIPINE ER OSMOTIC RELEASE 30 MG PO TB24
30.0000 mg | ORAL_TABLET | Freq: Once | ORAL | Status: AC
  Administered 2023-07-09: 30 mg via ORAL
  Filled 2023-07-09: qty 1

## 2023-07-09 MED ORDER — ACETAMINOPHEN 500 MG PO TABS
1000.0000 mg | ORAL_TABLET | Freq: Four times a day (QID) | ORAL | Status: DC | PRN
  Administered 2023-07-09: 1000 mg via ORAL
  Filled 2023-07-09: qty 2

## 2023-07-09 MED ORDER — POTASSIUM CHLORIDE CRYS ER 20 MEQ PO TBCR
40.0000 meq | EXTENDED_RELEASE_TABLET | ORAL | Status: AC
  Administered 2023-07-09 (×2): 40 meq via ORAL
  Filled 2023-07-09 (×2): qty 2

## 2023-07-09 NOTE — Progress Notes (Signed)
PROGRESS NOTE    Erika Lewis  UYQ:[id number] DOB: May 12, 1986 DOA: 06/29/2023 PCP: Nigel Bridgeman, CNM   Brief Narrative: 37 year old with past medical history significant for diabetes type 2, hypertension, asthma, sickle cell trait underwent C-section and salpingectomy on 9/28.  Postoperative patient had postpartum hemorrhage.  She was transfused and received IV iron.  She developed abdominal distention, nausea and vomiting.  CT abdomen and pelvis showed marked fluid distention of the stomach and distended small bowel loop.  Extensive bilateral consolidation in the lungs.  Negative for fascial dehiscence.  Hospitalist consulted for management of pneumonia and medical problems. Post surgery patient develop SBO versus ileus, pneumonia, also found to have Rhinovirus   Assessment & Plan:   Principal Problem:   Chronic hypertension affecting pregnancy Active Problems:   S/P cesarean section   Acute postoperative anemia due to greater than expected blood loss   Postpartum hemorrhage   Rhinovirus   RSV (respiratory syncytial virus pneumonia)   1-Pneumonia: Aspiration   pneumonia, Rhinovirus.  Follow blood cultures: No growth to date Lactic acid negative She was treated with Cefepime and Flagyl for 5 days. Change antibiotics to oral today Augemntin for 2 days.  Off oxygen.   SBO, Ischemic wall of cecum without perforation or abscess.  Underwent S/P exploratory laparotomy with right hemicolectomy 10/05. Surgery Following NG tube placed on 10/03 yielding 900 cc Management per Sx.  Replete electrolytes.  Diet to be advanced to soft. Stable for discharge per sx if tolerates diet.  Had BM.   Hypokalemia: Replete orally.   Hypernatremia:  Resolved with D 5. IV bolus  Off IV fluids.   Hypoalbuminemia She will need supplement when tolerating oral. .   AKI Improved.  Voiding trial.   Mild Pulmonary congestion Cardiomegaly w/ mild congestion noted on CXR.  IV Lasix x 1  given.  ECHO Normal EF  BNP 31.  DM type 2: Continue with SSI  Chronic HTN, affecting pregnancy PRN Hydralazine, labetalol.  Resume nifedipine,   Asthma: Pulmicort. PRN albuterol.  She prefer albuterol Inhaler, change it   Post Partum Twins. PPH S/P C section and PPH/  Per GYN OB Hb stable.      Estimated body mass index is 43.62 kg/m as calculated from the following:   Height as of this encounter: 5\' 4"  (1.626 m).   Weight as of this encounter: 115.3 kg.   DVT prophylaxis: SCD Code Status: Full code Family Communication: Care discussed with patient Disposition Plan:  Status is: Inpatient Remains inpatient appropriate because: discharge when clear by Sx, OBY. Will sign off, please call with questions.    Antimicrobials:    Subjective: She is feeling well, had BM. Denies abdominal pain   Objective: Vitals:   07/08/23 2105 07/08/23 2234 07/09/23 0504 07/09/23 0818  BP:  (!) 153/78 (!) 143/85 (!) 141/81  Pulse: 98 100 98 94  Resp: 16 18 17 19   Temp:  99.9 F (37.7 C) 98.8 F (37.1 C) 98.5 F (36.9 C)  TempSrc:  Oral Oral Oral  SpO2: 100% 95% 95% 96%  Weight:      Height:        Intake/Output Summary (Last 24 hours) at 07/09/2023 1159 Last data filed at 07/09/2023 0818 Gross per 24 hour  Intake 850 ml  Output 1101 ml  Net -251 ml   Filed Weights   06/29/23 0940  Weight: 115.3 kg    Examination:  General exam: NAD Respiratory system: Normal resp effort    Data Reviewed:  I have personally reviewed following labs and imaging studies  CBC: Recent Labs  Lab 07/03/23 0610 07/04/23 2020 07/05/23 0203 07/06/23 0456 07/07/23 0405 07/08/23 0852 07/09/23 0448  WBC 14.2* 10.2 8.1 11.4* 14.6* 14.5* 12.4*  NEUTROABS 12.6* 8.2* 6.6 9.7*  --   --   --   HGB 10.4* 9.1* 9.4* 9.4* 9.6* 8.9* 9.1*  HCT 33.1* 28.8* 29.9* 30.7* 32.6* 29.1* 29.2*  MCV 77.9* 79.6* 77.3* 78.9* 80.9 80.8 77.0*  PLT 209 340 266 300 352 265 334   Basic Metabolic  Panel: Recent Labs  Lab 07/06/23 0456 07/07/23 0405 07/07/23 1358 07/07/23 1713 07/08/23 0852 07/09/23 0448  NA 148* 151* 153* 149* 143 136  K 2.9* 3.3* 3.9 3.5 4.3 3.1*  CL 107 112* 113* 113* 111 105  CO2 28 27 25 24  20* 22  GLUCOSE 126* 112* 125* 139* 95 127*  BUN 19 20 17 13 8  <5*  CREATININE 0.80 0.92 0.86 0.85 0.72 0.62  CALCIUM 8.2* 7.5* 7.5* 7.6* 7.2* 7.6*  MG 2.7*  --   --   --   --   --    GFR: Estimated Creatinine Clearance: 121.1 mL/min (by C-G formula based on SCr of 0.62 mg/dL). Liver Function Tests: Recent Labs  Lab 07/04/23 1101 07/04/23 2020 07/05/23 0203 07/06/23 0456 07/07/23 0405  AST 21 12* 12* 15 12*  ALT 15 17 15 13 10   ALKPHOS 69 73 58 65 60  BILITOT 0.8 0.9 0.1* 0.6 0.5  PROT 5.3* 5.6* 5.0* 5.4* 5.1*  ALBUMIN 1.9* 1.9* 1.7* 1.8* 1.7*   No results for input(s): "LIPASE", "AMYLASE" in the last 168 hours. No results for input(s): "AMMONIA" in the last 168 hours. Coagulation Profile: Recent Labs  Lab 07/04/23 2020  INR 1.2   Cardiac Enzymes: No results for input(s): "CKTOTAL", "CKMB", "CKMBINDEX", "TROPONINI" in the last 168 hours. BNP (last 3 results) No results for input(s): "PROBNP" in the last 8760 hours. HbA1C: No results for input(s): "HGBA1C" in the last 72 hours.  CBG: Recent Labs  Lab 07/08/23 1245 07/08/23 1648 07/08/23 2002 07/09/23 0505 07/09/23 0819  GLUCAP 141* 115* 105* 126* 101*   Lipid Profile: No results for input(s): "CHOL", "HDL", "LDLCALC", "TRIG", "CHOLHDL", "LDLDIRECT" in the last 72 hours. Thyroid Function Tests: No results for input(s): "TSH", "T4TOTAL", "FREET4", "T3FREE", "THYROIDAB" in the last 72 hours. Anemia Panel: No results for input(s): "VITAMINB12", "FOLATE", "FERRITIN", "TIBC", "IRON", "RETICCTPCT" in the last 72 hours. Sepsis Labs: Recent Labs  Lab 07/04/23 2020 07/04/23 2226  LATICACIDVEN 1.9 1.5    Recent Results (from the past 240 hour(s))  Culture, OB Urine     Status: Abnormal    Collection Time: 07/03/23  5:57 AM   Specimen: Urine, Random  Result Value Ref Range Status   Specimen Description URINE, RANDOM  Final   Special Requests NONE  Final   Culture (A)  Final    MULTIPLE SPECIES PRESENT, SUGGEST RECOLLECTION NO GROUP B STREP (S.AGALACTIAE) ISOLATED Performed at [facility], [address] [place], [place] [address]    Report Status 07/04/2023 FINAL  Final  Respiratory (~20 pathogens) panel by PCR     Status: Abnormal   Collection Time: 07/04/23  4:30 AM   Specimen: Nasopharyngeal Swab; Respiratory  Result Value Ref Range Status   Adenovirus NOT DETECTED NOT DETECTED Final   Coronavirus 229E NOT DETECTED NOT DETECTED Final    Comment: (NOTE) The Coronavirus on the Respiratory Panel, DOES NOT test for the novel  Coronavirus (2019 nCoV)    Coronavirus HKU1 NOT DETECTED NOT DETECTED Final   Coronavirus NL63 NOT DETECTED NOT DETECTED Final   Coronavirus OC43 NOT DETECTED NOT DETECTED Final   Metapneumovirus NOT DETECTED NOT DETECTED Final   Rhinovirus / Enterovirus DETECTED (A) NOT DETECTED Final   Influenza A NOT DETECTED NOT DETECTED Final   Influenza B NOT DETECTED NOT DETECTED Final   Parainfluenza Virus 1 NOT DETECTED NOT DETECTED Final   Parainfluenza Virus 2 NOT DETECTED NOT DETECTED Final   Parainfluenza Virus 3 NOT DETECTED NOT DETECTED Final   Parainfluenza Virus 4 NOT DETECTED NOT DETECTED Final   Respiratory Syncytial Virus NOT DETECTED NOT DETECTED Final   Bordetella pertussis NOT DETECTED NOT DETECTED Final   Bordetella Parapertussis NOT DETECTED NOT DETECTED Final   Chlamydophila pneumoniae NOT DETECTED NOT DETECTED Final   Mycoplasma pneumoniae NOT DETECTED NOT DETECTED Final    Comment: Performed at [facility], [address] [place], [place] [address]  Culture, blood (x 2)     Status: None   Collection Time: 07/04/23  8:20 PM   Specimen: BLOOD RIGHT ARM  Result Value Ref Range Status   Specimen Description BLOOD  RIGHT ARM  Final   Special Requests   Final    BOTTLES DRAWN AEROBIC AND ANAEROBIC Blood Culture adequate volume   Culture   Final    NO GROWTH 5 DAYS Performed at [facility], [address] [place], [place] [address]    Report Status 07/09/2023 FINAL  Final  Culture, blood (x 2)     Status: None   Collection Time: 07/04/23  8:21 PM   Specimen: BLOOD RIGHT HAND  Result Value Ref Range Status   Specimen Description BLOOD RIGHT HAND  Final   Special Requests   Final    BOTTLES DRAWN AEROBIC AND ANAEROBIC Blood Culture adequate volume   Culture   Final    NO GROWTH 5 DAYS Performed at [facility], [address] [place], [place] [address]    Report Status 07/09/2023 FINAL  Final         Radiology Studies: No results found.      Scheduled Meds:  amoxicillin-clavulanate  1 tablet Oral Q12H   budesonide  0.25 mg Nebulization BID   enoxaparin (LOVENOX) injection  60 mg Subcutaneous Q24H   guaiFENesin  1,200 mg Oral BID   insulin aspart  0-15 Units Subcutaneous Q4H   ketorolac  30 mg Intravenous Q6H   Or   ketorolac  30 mg Intramuscular Q6H   NIFEdipine  30 mg Oral Daily   pantoprazole  40 mg Oral BID AC   potassium chloride  40 mEq Oral Q4H   prenatal multivitamin  1 tablet Oral Q1200   senna-docusate  2 tablet Oral Daily   Continuous Infusions:  sodium chloride 10 mL/hr at 07/06/23 1214   naloxone HCl (NARCAN) 2 mg in dextrose 5 % 250 mL infusion       LOS: 10 days    Time spent: 35 minutes    Sedona Wenk A Isabellarose Kope, MD [facility]   If 7PM-7AM, please contact night-coverage [email or web address]  07/09/2023, 11:59 AM

## 2023-07-09 NOTE — Care Management Note (Addendum)
Case Management Note  Patient Details  Name: Erika Lewis MRN: [id number] Date of Birth: 1986-01-28  Subjective/Objective:                  37 y/o [id number] POD # 9 s/p Primary C/section for twins, post op course significant for small bowel obstructions and patient is now  POD 2 s/p  Exploratory laparotomy with right hemicolectomy for Ischemic wall of cecum without perforation or abscess.    In-House Referral:  PT    DME Arranged:  Walker rolling, 3-N-1 DME Agency:  [employer]   Additional Comments: CM had referral for rolling walker with 5 inch wheels and also 3 N 1. CM called [employer] with referral and spoke to [place] and he accepted referral and shared he would deliver to patient's room prior to discharge.  CM messaged physical therapist and no outpatient therapy recommended after discharge. Patient had outpatient follow up appointments after discharge planned. No barriers with medications.      Geoffery Lyons, RN 07/09/2023, 9:35 AM

## 2023-07-09 NOTE — Progress Notes (Signed)
Physical Therapy Treatment Patient Details Name: Erika Lewis MRN: [id number] DOB: 01-14-1986 Today's Date: 07/09/2023   History of Present Illness Pt is 37 year old presented to [facility] on  06/29/23 for c-section of twins at 36 weeks. Pt with post op nausea and vomiting. Pt found to have PNA and RSV. Pt underwent exploratory laparotomy with rt hemicolectomy for ischemic wall of cecum on 07/06/23. PMH - HTN, DM, asthma, sickle cell trait    PT Comments  Patient progressing with mobility with RW in hallway and able to transition supine<>sit with cues to flat bed and sit<>stand mod I.  She reports difficulty with socks so attempted multiple techniques and pt successful with using sock aide and reacher.  Educated where to obtain if desired though if has some assistance from friends/family could progress and not need equipment.  Feel she will continue to progress without follow up PT, though will follow along during acute stay.     If plan is discharge home, recommend the following:     Can travel by private vehicle        Equipment Recommendations  Rolling walker (2 wheels);BSC/3in1    Recommendations for Other Services       Precautions / Restrictions Precautions Precaution Comments: abdominal incisions; droplet     Mobility  Bed Mobility Overal bed mobility: Needs Assistance Bed Mobility: Sidelying to Sit, Sit to Supine   Sidelying to sit: Supervision   Sit to supine: Supervision   General bed mobility comments: to flat bed with cues for flexing knees to prevent stretching abdomen too quickly and for rolling and using UE's for supine to sit    Transfers Overall transfer level: Modified independent Equipment used: None, Rolling walker (2 wheels) Transfers: Sit to/from Stand Sit to Stand: Modified independent (Device/Increase time)           General transfer comment: standing on her own in the room    Ambulation/Gait Ambulation/Gait assistance: Modified independent  (Device/Increase time) Gait Distance (Feet): 250 Feet Assistive device: Rolling walker (2 wheels) Gait Pattern/deviations: Step-through pattern, Decreased stride length       General Gait Details: adjusted walker height for fit and used in hallway as pt relates had SOB with hallway ambulation   Stairs             Wheelchair Mobility     Tilt Bed    Modified Rankin (Stroke Patients Only)       Balance Overall balance assessment: Modified Independent                                          Cognition Arousal: Alert Behavior During Therapy: WFL for tasks assessed/performed Overall Cognitive Status: Within Functional Limits for tasks assessed                                          Exercises      General Comments General comments (skin integrity, edema, etc.): Reported difficulty donning socks, tried seated with crossed leg technique and hooklying with crossed leg technique, but pt unable to reach due to distended and sore abdomen and limited arm reach so demonstrated and educated on use of sock aide and reacher and where to obtain.  Patient able to don sock with sock aide and simulate pants with  reacher with S.  Also educated on gradual progression of activities and for rest between activities to prevent strain and impede healing.      Pertinent Vitals/Pain Pain Assessment Faces Pain Scale: Hurts little more Pain Location: abdomen Pain Descriptors / Indicators: Guarding, Sore Pain Intervention(s): Monitored during session    Home Living                          Prior Function            PT Goals (current goals can now be found in the care plan section) Progress towards PT goals: Progressing toward goals    Frequency    Min 1X/week      PT Plan      Co-evaluation              AM-PAC PT "6 Clicks" Mobility   Outcome Measure  Help needed turning from your back to your side while in a flat bed  without using bedrails?: None Help needed moving from lying on your back to sitting on the side of a flat bed without using bedrails?: None Help needed moving to and from a bed to a chair (including a wheelchair)?: None Help needed standing up from a chair using your arms (e.g., wheelchair or bedside chair)?: None Help needed to walk in hospital room?: None Help needed climbing 3-5 steps with a railing? : A Little 6 Click Score: 23    End of Session   Activity Tolerance: Patient tolerated treatment well Patient left: in bed;with family/visitor present   PT Visit Diagnosis: Other abnormalities of gait and mobility (R26.89);Pain Pain - part of body:  (abdomen)     Time: 1610-9604 PT Time Calculation (min) (ACUTE ONLY): 23 min  Charges:    $Gait Training: 8-22 mins $Self Care/Home Management: 8-22 PT General Charges $$ ACUTE PT VISIT: 1 Visit                     Sheran Lawless, PT Acute Rehabilitation Services [phone] 07/09/2023    Elray Mcgregor 07/09/2023, 10:56 AM

## 2023-07-09 NOTE — Progress Notes (Signed)
3 Days Post-Op  Subjective: Denies abdominal pain this AM. Denies nausea or vomiting. Has not had soft diet yet but plans to try breakfast. [birth year] is her birthday and she is hoping to be home.   Objective: Vital signs in last 24 hours: Temp:  [98.4 F (36.9 C)-99.9 F (37.7 C)] 98.5 F (36.9 C) (10/08 0818) Pulse Rate:  [87-109] 94 (10/08 0818) Resp:  [16-20] 19 (10/08 0818) BP: (138-157)/(78-88) 141/81 (10/08 0818) SpO2:  [95 %-100 %] 96 % (10/08 0818)    Intake/Output from previous day: 10/07 0701 - 10/08 0700 In: 850 [P.O.:850] Out: 1501 [Urine:1500; Stool:1] Intake/Output this shift: No intake/output data recorded.  PE: Gen:  Alert, NAD, pleasant Abd: Soft, improved mild distension, appropriately ttp around incisions, no rigidity or guarding, +BS, midline and pfannenstiel incision with honeycomb dressings in place, cdi.   Lab Results:  Recent Labs    07/08/23 0852 07/09/23 0448  WBC 14.5* 12.4*  HGB 8.9* 9.1*  HCT 29.1* 29.2*  PLT 265 334   BMET Recent Labs    07/08/23 0852 07/09/23 0448  NA 143 136  K 4.3 3.1*  CL 111 105  CO2 20* 22  GLUCOSE 95 127*  BUN 8 <5*  CREATININE 0.72 0.62  CALCIUM 7.2* 7.6*   PT/INR No results for input(s): "LABPROT", "INR" in the last 72 hours.  CMP     Component Value Date/Time   NA 136 07/09/2023 0448   K 3.1 (L) 07/09/2023 0448   CL 105 07/09/2023 0448   CO2 22 07/09/2023 0448   GLUCOSE 127 (H) 07/09/2023 0448   BUN <5 (L) 07/09/2023 0448   CREATININE 0.62 07/09/2023 0448   CALCIUM 7.6 (L) 07/09/2023 0448   PROT 5.1 (L) 07/07/2023 0405   ALBUMIN 1.7 (L) 07/07/2023 0405   AST 12 (L) 07/07/2023 0405   ALT 10 07/07/2023 0405   ALKPHOS 60 07/07/2023 0405   BILITOT 0.5 07/07/2023 0405   GFRNONAA >60 07/09/2023 0448   GFRAA >60 03/31/2019 0210   Lipase     Component Value Date/Time   LIPASE 27 09/09/2017 1803    Studies/Results: No results found.  Anti-infectives: Anti-infectives (From  admission, onward)    Start     Dose/Rate Route Frequency Ordered Stop   07/08/23 1200  ceFEPIme (MAXIPIME) 2 g in sodium chloride 0.9 % 100 mL IVPB  Status:  Discontinued        2 g 200 mL/hr over 30 Minutes Intravenous Every 8 hours 07/08/23 0925 07/08/23 0958   07/08/23 1045  amoxicillin-clavulanate (AUGMENTIN) 875-125 MG per tablet 1 tablet        1 tablet Oral Every 12 hours 07/08/23 0958 07/10/23 0959   07/08/23 0945  metroNIDAZOLE (FLAGYL) IVPB 500 mg  Status:  Discontinued        500 mg 100 mL/hr over 60 Minutes Intravenous Every 12 hours 07/08/23 0925 07/08/23 0958   07/05/23 0945  metroNIDAZOLE (FLAGYL) IVPB 500 mg  Status:  Discontinued        500 mg 100 mL/hr over 60 Minutes Intravenous Every 12 hours 07/05/23 0846 07/08/23 0925   07/04/23 2100  metroNIDAZOLE (FLAGYL) IVPB 500 mg  Status:  Discontinued        500 mg 100 mL/hr over 60 Minutes Intravenous Every 8 hours 07/04/23 1923 07/05/23 0125   07/04/23 2000  ceFEPIme (MAXIPIME) 2 g in sodium chloride 0.9 % 100 mL IVPB  Status:  Discontinued        2 g  200 mL/hr over 30 Minutes Intravenous Every 8 hours 07/04/23 1923 07/08/23 0925   07/03/23 0917  Ampicillin-Sulbactam (UNASYN) 3 g in sodium chloride 0.9 % 100 mL IVPB  Status:  Discontinued        3 g 200 mL/hr over 30 Minutes Intravenous Every 6 hours 07/03/23 0917 07/04/23 0943   06/29/23 0909  azithromycin (ZITHROMAX) 500 mg in sodium chloride 0.9 % 250 mL IVPB  Status:  Discontinued        500 mg 250 mL/hr over 60 Minutes Intravenous 60 min pre-op 06/29/23 0909 06/29/23 1547   06/29/23 0909  ceFAZolin (ANCEF) IVPB 2g/100 mL premix        2 g 200 mL/hr over 30 Minutes Intravenous 30 min pre-op 06/29/23 1610 06/29/23 1135        Assessment/Plan Hx of C-section 9/28 POD 3 s/p  Exploratory laparotomy with 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 Cliffton Asters on 07/06/23 - Soft diet  - Mobilize - Pulm toilet - stable for DC from  gen surgery standpoint if tolerating soft diet - will arrange follow up in our office around POD14  FEN - soft. IVF per primary  VTE - SCDs, Lovenox ID - On Cefepime/Flagyl for PNA. Does not need any further abx from our standpoint. ,  Foley - out, voiding.     LOS: 10 days    Juliet Rude , [facility] 07/09/2023, 8:42 AM Please see Amion for pager number during day hours 7:00am-4:30pm

## 2023-07-09 NOTE — Lactation Note (Signed)
This note was copied from a baby's chart. Lactation Consultation Note  Patient Name: Erika Lewis NFAOZ'H Date: 07/09/2023 Age:37 days Reason for consult: Follow-up assessment  LC asked Mom's RN to inquire if she would like lactation consult today.  Mom declined lactation support.  Both babies are formula feeding by bottle.  Feeding Mother's Current Feeding Choice: Formula Nipple Type: Nfant Slow Flow (purple)   Consult Status Consult Status: Complete Date: 07/09/23    Judee Clara RN IBCLC 07/09/2023, 12:22 PM

## 2023-07-09 NOTE — Progress Notes (Signed)
Subjective: Postpartum Day 10: Cesarean Delivery POD #3 RT hemicolectomy Patient reports tolerating PO, + flatus, + BM, and no problems voiding.  Denies N/V.  Has tried soft food today.  BMs are still watery/very loose.  Pt says she no longer has abdominal pain and no incisional pain either.  Objective: Vital signs in last 24 hours: Temp:  [98.5 F (36.9 C)-99.9 F (37.7 C)] 98.5 F (36.9 C) (10/08 0818) Pulse Rate:  [94-109] 94 (10/08 0818) Resp:  [16-20] 19 (10/08 0818) BP: (138-153)/(78-88) 141/81 (10/08 0818) SpO2:  [95 %-100 %] 96 % (10/08 0818)  Physical Exam:  General: alert, cooperative, and mild distress Lochia: appropriate Uterine Fundus: FF Abdomen: appropriately tender, softly distended, +BS in all 4 quadrants, no rebound or guarding Incision: healing well, dressing c/d/i DVT Evaluation: no calf tenderness  Recent Labs    07/08/23 0852 07/09/23 0448  HGB 8.9* 9.1*  HCT 29.1* 29.2*    Assessment/Plan: Status post Cesarean section and rt hemicolectomy as aboveDoing well postoperatively.  Continue current care Diet has been advancing without difficulty, NG tube is no longer in place T2DM cbgs q 4hrs and SSI.  Pt says she still has insulin at home.  She is a CNA 2.  I discussed CBGs fasting and 2hr after meals with SSI and f/u next week in the office.  Pt was dx'd early in the pregnancy and was not aware of being diabetic prior to pregnancy. CHTN - on procardia xl 30mg  daily with still slightly elevated BPs.  Will give another 30mg  of procardia xl now and increase to 60mg  daily. Hypokalemia - k-chlor q4 x 2 doses given by hospitalist.  Will recheck labs in the morning H/o Asthma - albuterol prn Rhinovirus and Aspiration Pneumonia - now on augmentin for 2 days Discharge planning - General surgery will arrange f/u in their office around POD #14 Pt needs to be seen in our office next week If continue to do well today and tolerates food without difficulty,  consider discharge home tomorrow.  Bartol Nails, MD 07/09/2023, 1:11 PM

## 2023-07-10 LAB — CBC WITH DIFFERENTIAL/PLATELET
Abs Immature Granulocytes: 0.58 10*3/uL — ABNORMAL HIGH (ref 0.00–0.07)
Basophils Absolute: 0 10*3/uL (ref 0.0–0.1)
Basophils Relative: 0 %
Eosinophils Absolute: 0.4 10*3/uL (ref 0.0–0.5)
Eosinophils Relative: 3 %
HCT: 31.2 % — ABNORMAL LOW (ref 36.0–46.0)
Hemoglobin: 9.7 g/dL — ABNORMAL LOW (ref 12.0–15.0)
Immature Granulocytes: 4 %
Lymphocytes Relative: 11 %
Lymphs Abs: 1.5 10*3/uL (ref 0.7–4.0)
MCH: 24.1 pg — ABNORMAL LOW (ref 26.0–34.0)
MCHC: 31.1 g/dL (ref 30.0–36.0)
MCV: 77.6 fL — ABNORMAL LOW (ref 80.0–100.0)
Monocytes Absolute: 0.6 10*3/uL (ref 0.1–1.0)
Monocytes Relative: 4 %
Neutro Abs: 10.5 10*3/uL — ABNORMAL HIGH (ref 1.7–7.7)
Neutrophils Relative %: 78 %
Platelets: 315 10*3/uL (ref 150–400)
RBC: 4.02 MIL/uL (ref 3.87–5.11)
RDW: 27.3 % — ABNORMAL HIGH (ref 11.5–15.5)
WBC: 13.5 10*3/uL — ABNORMAL HIGH (ref 4.0–10.5)
nRBC: 0.2 % (ref 0.0–0.2)

## 2023-07-10 LAB — GLUCOSE, CAPILLARY: Glucose-Capillary: 125 mg/dL — ABNORMAL HIGH (ref 70–99)

## 2023-07-10 LAB — BASIC METABOLIC PANEL
Anion gap: 9 (ref 5–15)
BUN: 7 mg/dL (ref 6–20)
CO2: 20 mmol/L — ABNORMAL LOW (ref 22–32)
Calcium: 7.9 mg/dL — ABNORMAL LOW (ref 8.9–10.3)
Chloride: 108 mmol/L (ref 98–111)
Creatinine, Ser: 0.6 mg/dL (ref 0.44–1.00)
GFR, Estimated: >60 mL/min (ref >60)
Glucose, Bld: 115 mg/dL — ABNORMAL HIGH (ref 70–99)
Potassium: 4.5 mmol/L (ref 3.5–5.1)
Sodium: 137 mmol/L (ref 135–145)

## 2023-07-10 MED ORDER — GUAIFENESIN ER 600 MG PO TB12: 1200.0000 mg | ORAL_TABLET | Freq: Two times a day (BID) | ORAL | 0 refills | Status: AC | PRN

## 2023-07-10 MED ORDER — IBUPROFEN 600 MG PO TABS: 600.0000 mg | ORAL_TABLET | Freq: Four times a day (QID) | ORAL | 0 refills | Status: AC | PRN

## 2023-07-10 MED ORDER — NIFEDIPINE ER 60 MG PO TB24: 60.0000 mg | ORAL_TABLET | Freq: Every day | ORAL | 1 refills | Status: AC

## 2023-07-10 MED ORDER — OXYCODONE HCL 5 MG PO TABS
5.0000 mg | ORAL_TABLET | ORAL | 0 refills | Status: AC | PRN
Start: 2023-07-10 — End: ?

## 2023-07-10 MED ORDER — GUAIFENESIN 100 MG/5ML PO LIQD: 10.0000 mL | ORAL | 0 refills | Status: AC | PRN

## 2023-07-10 MED ORDER — INSULIN ASPART 100 UNIT/ML IJ SOLN: 0.0000 [IU] | INTRAMUSCULAR | 11 refills | Status: AC

## 2023-07-10 NOTE — Progress Notes (Signed)
4 Days Post-Op  Subjective: Denies abdominal pain this AM. Denies nausea or vomiting. Tolerating soft diet and having bowel function. Was able to shower yesterday. Today [birth year] her birthday.   Objective: Vital signs in last 24 hours: Temp:  [98.3 F (36.8 C)-98.8 F (37.1 C)] 98.6 F (37 C) (10/09 0522) Pulse Rate:  [87-104] 99 (10/09 0522) Resp:  [16-19] 18 (10/09 0522) BP: (135-155)/(81-83) 135/81 (10/09 0522) SpO2:  [96 %-100 %] 97 % (10/09 0522)    Intake/Output from previous day: 10/08 0701 - 10/09 0700 In: 1000 [P.O.:1000] Out: 0  Intake/Output this shift: No intake/output data recorded.  PE: Gen:  Alert, NAD, pleasant Abd: Soft, improved mild distension, appropriately ttp around incisions, no rigidity or guarding, +BS, midline incision C/D/I with staples present   Lab Results:  Recent Labs    07/09/23 0448 07/10/23 0442  WBC 12.4* 13.5*  HGB 9.1* 9.7*  HCT 29.2* 31.2*  PLT 334 315   BMET Recent Labs    07/09/23 0448 07/10/23 0442  NA 136 137  K 3.1* 4.5  CL 105 108  CO2 22 20*  GLUCOSE 127* 115*  BUN <5* 7  CREATININE 0.62 0.60  CALCIUM 7.6* 7.9*   PT/INR No results for input(s): "LABPROT", "INR" in the last 72 hours.  CMP     Component Value Date/Time   NA 137 07/10/2023 0442   K 4.5 07/10/2023 0442   CL 108 07/10/2023 0442   CO2 20 (L) 07/10/2023 0442   GLUCOSE 115 (H) 07/10/2023 0442   BUN 7 07/10/2023 0442   CREATININE 0.60 07/10/2023 0442   CALCIUM 7.9 (L) 07/10/2023 0442   PROT 5.1 (L) 07/07/2023 0405   ALBUMIN 1.7 (L) 07/07/2023 0405   AST 12 (L) 07/07/2023 0405   ALT 10 07/07/2023 0405   ALKPHOS 60 07/07/2023 0405   BILITOT 0.5 07/07/2023 0405   GFRNONAA >60 07/10/2023 0442   GFRAA >60 03/31/2019 0210   Lipase     Component Value Date/Time   LIPASE 27 09/09/2017 1803    Studies/Results: No results found.  Anti-infectives: Anti-infectives (From admission, onward)    Start     Dose/Rate Route Frequency Ordered Stop    07/08/23 1200  ceFEPIme (MAXIPIME) 2 g in sodium chloride 0.9 % 100 mL IVPB  Status:  Discontinued        2 g 200 mL/hr over 30 Minutes Intravenous Every 8 hours 07/08/23 0925 07/08/23 0958   07/08/23 1045  amoxicillin-clavulanate (AUGMENTIN) 875-125 MG per tablet 1 tablet        1 tablet Oral Every 12 hours 07/08/23 0958 07/09/23 2241   07/08/23 0945  metroNIDAZOLE (FLAGYL) IVPB 500 mg  Status:  Discontinued        500 mg 100 mL/hr over 60 Minutes Intravenous Every 12 hours 07/08/23 0925 07/08/23 0958   07/05/23 0945  metroNIDAZOLE (FLAGYL) IVPB 500 mg  Status:  Discontinued        500 mg 100 mL/hr over 60 Minutes Intravenous Every 12 hours 07/05/23 0846 07/08/23 0925   07/04/23 2100  metroNIDAZOLE (FLAGYL) IVPB 500 mg  Status:  Discontinued        500 mg 100 mL/hr over 60 Minutes Intravenous Every 8 hours 07/04/23 1923 07/05/23 0125   07/04/23 2000  ceFEPIme (MAXIPIME) 2 g in sodium chloride 0.9 % 100 mL IVPB  Status:  Discontinued        2 g 200 mL/hr over 30 Minutes Intravenous Every 8 hours 07/04/23 1923 07/08/23  1610   07/03/23 0917  Ampicillin-Sulbactam (UNASYN) 3 g in sodium chloride 0.9 % 100 mL IVPB  Status:  Discontinued        3 g 200 mL/hr over 30 Minutes Intravenous Every 6 hours 07/03/23 0917 07/04/23 0943   06/29/23 0909  azithromycin (ZITHROMAX) 500 mg in sodium chloride 0.9 % 250 mL IVPB  Status:  Discontinued        500 mg 250 mL/hr over 60 Minutes Intravenous 60 min pre-op 06/29/23 0909 06/29/23 1547   06/29/23 0909  ceFAZolin (ANCEF) IVPB 2g/100 mL premix        2 g 200 mL/hr over 30 Minutes Intravenous 30 min pre-op 06/29/23 9604 06/29/23 1135       Assessment/Plan Hx of C-section 9/28 POD 4 s/p  Exploratory laparotomy with 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 Cliffton Asters on 07/06/23 - reg diet - Mobilize - Pulm toilet - stable for DC from gen surgery standpoint, follow up in AVS  FEN - reg. IVF per primary  VTE  - SCDs, Lovenox ID - PO augmentin per TRH Foley - out, voiding.     LOS: 11 days    Juliet Rude , [facility] Surgery 07/10/2023, 8:00 AM Please see Amion for pager number during day hours 7:00am-4:30pm

## 2023-07-10 NOTE — Discharge Summary (Addendum)
Postpartum Discharge Summary    Patient Name: Erika Lewis DOB: Jun 16, 1986 MRN: [id number]  Date of admission: 06/29/2023 Delivery date:   Erika Lewis [[id number]]  06/29/2023    Erika Lewis, Erika Lewis [[id number]]  06/29/2023 Delivering provider:    Precious Lewis [[id number]]  Erika Lewis, Erika Lewis [[id number]]  Erika Lewis Date of discharge: 07/10/2023  Admitting diagnosis: Chronic hypertension affecting pregnancy [O10.919] S/P cesarean section [Z98.891] Intrauterine pregnancy: [redacted]w[redacted]d     Secondary diagnosis:  Principal Problem:   Chronic hypertension affecting pregnancy Active Problems:   S/P cesarean section   Acute postoperative anemia due to greater than expected blood loss   Postpartum hemorrhage   Rhinovirus   RSV (respiratory syncytial virus pneumonia)  Additional problems:  Postpartum hemorrhage. 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  Postpartum endometritis.  Type 2 DM.     Discharge diagnosis: Term Pregnancy Delivered                                              Post partum procedures:postpartum tubal ligation and Exploratory laparotomy, right  hemicolectomy.  Augmentation: N/A Complications: Bowel obstruction.   Hospital course: Scheduled C/S   37 y.o. yo [phone] at [redacted]w[redacted]d was admitted to the hospital 06/29/2023 for scheduled cesarean section with the following indication:Malpresentation, Multifetal Gestation, and Chronic Hypertension .Delivery details are as follows:  Membrane Rupture Time/Date:    Erika Lewis [[id number]]  11:40 AM    Erika Lewis [[id number]]  11:40 AM,   Erika Lewis [[id number]]  06/29/2023    Erika Lewis, Erika Lewis [[id number]]  06/29/2023  Delivery Method:   Erika Lewis [[id number]]  C-Section, Low [address]    Erika Lewis, Erika Lewis [[id number]]  C-Section, Low Transverse Operative Delivery:N/A Details of operation can be found in separate operative note.   Patient had an EBL of 1773 cc during cesarean delivery and she received IV iron on POD # 1.  Patient also had postpartum endometritis, rhinovirus infection, pneumonia and bowel obstruction.  She received intravenous antibiotics then oral antibiotics and underwent a exploratory laparotomy, right hemicolectoy for ischemic wall of cecum and without perforation or abscess on 07/06/23.  By POD 4 after her bowel surgery and on POD 11 after her cesarean section she was markedly improved.  She is ambulating, tolerating a regular diet, passing flatus, and urinating well. Patient is discharged home in stable condition on  07/10/23.  She was advised to continue checking her sugars while fasting and 2 hour post-prandial and self administer novolog as per the insulin sliding scale and to bring her sugar log for review at the office.         Newborn Data: Birth date:   Erika Lewis [[id number]]  06/29/2023    Erika Lewis, Erika Lewis [[id number]]  06/29/2023 Birth time:   Erika Lewis [[id number]]  11:40 AM    Erika Lewis [[id number]]  11:42 AM Gender:   Erika Lewis [[id number]]  Female    Erika Lewis, Erika Lewis [[id number]]  Female Living status:   Erika Lewis [[id number]]  HENIDP    Erika Lewis, Erika Lewis [[id number]]  Living Apgars:   Erika Lewis [[id number]]  [address] [place] [[id number]]  7 ,   Erika Lewis [[id number]]  [address] [place] [[id number]]  8  Weight:   Erika Lewis [[id number]]  2.29 kg    Santina, Trillo Lajuanda [[id number]]  3.15 kg    Physical exam  Vitals:   07/09/23 1636 07/09/23 2040 07/09/23 2047 07/10/23 0522  BP: (!) 155/83 (!) 147/81  135/81  Pulse: 92 (!) 104 100 99  Resp: 19 18 16 18   Temp: 98.5 F (36.9 C) 98.3 F (36.8 C)  98.6 F (37 C)  TempSrc: Oral Oral  Oral  SpO2: 99% 96% 98% 97%  Weight:      Height:       General: alert and cooperative Lochia: appropriate Uterine Fundus:  firm Incision: Healing well with no significant drainage.  Pfannenstiel incision with steri strips, clean, dry and intact.  Midline vertical incision with staples.  DVT Evaluation: No evidence of DVT seen on physical exam. Calf/Ankle edema is present Labs: Lab Results  Component Value Date   WBC 13.5 (H) 07/10/2023   HGB 9.7 (L) 07/10/2023   HCT 31.2 (L) 07/10/2023   MCV 77.6 (L) 07/10/2023   PLT 315 07/10/2023      Latest Ref Rng & Units 07/10/2023    4:42 AM  CMP  Glucose 70 - 99 mg/dL 782   BUN 6 - 20 mg/dL 7   Creatinine 9.56 - 2.13 mg/dL 0.86   Sodium 578 - 469 mmol/L 137   Potassium 3.5 - 5.1 mmol/L 4.5   Chloride 98 - 111 mmol/L 108   CO2 22 - 32 mmol/L 20   Calcium 8.9 - 10.3 mg/dL 7.9    CBG (last 3)  Recent Labs    07/09/23 1634 07/09/23 2042 07/10/23 0528  GLUCAP 144* 107* 125*     Edinburgh Score:    07/01/2023    1:10 PM  Edinburgh Postnatal Depression Scale Screening Tool  I have been able to laugh and see the funny side of things. 0  I have looked forward with enjoyment to things. 0  I have blamed myself unnecessarily when things went wrong. 1  I have been anxious or worried for no good reason. 0  I have felt scared or panicky for no good reason. 0  Things have been getting on top of me. 0  I have been so unhappy that I have had difficulty sleeping. 0  I have felt sad or miserable. 0  I have been so unhappy that I have been crying. 0  The thought of harming myself has occurred to me. 0  Edinburgh Postnatal Depression Scale Total 1   No data recorded  After visit meds:  Allergies as of 07/10/2023   No Known Allergies      Medication List     STOP taking these medications    aspirin EC 81 MG tablet   pantoprazole 40 MG tablet Commonly known as: PROTONIX       TAKE these medications    acetaminophen 500 MG tablet Commonly known as: TYLENOL Take 1,000 mg by mouth every 6 (six) hours as needed for moderate pain.   albuterol 108 (90  Base) MCG/ACT inhaler Commonly known as: VENTOLIN HFA Inhale 1-2 puffs into the lungs every 6 (six) hours as needed for wheezing or shortness of breath.   fluticasone 110 MCG/ACT inhaler Commonly known as: FLOVENT HFA Inhale 2 puffs into the lungs daily.   guaiFENesin 600 MG 12 hr tablet Commonly known as: MUCINEX Take 2 tablets (1,200 mg total) by mouth 2 (two) times daily as needed for up to 14 doses.   guaiFENesin  100 MG/5ML liquid Commonly known as: ROBITUSSIN Take 10 mLs by mouth every 4 (four) hours as needed for up to 42 doses for cough or to loosen phlegm.   ibuprofen 600 MG tablet Commonly known as: ADVIL Take 1 tablet (600 mg total) by mouth every 6 (six) hours as needed for mild pain, moderate pain or cramping.   insulin aspart 100 UNIT/ML injection Commonly known as: novoLOG Inject 0-15 Units into the skin every 4 (four) hours. What changed:  how much to take when to take this   multivitamin-prenatal 27-0.8 MG Tabs tablet Take 1 tablet by mouth daily.   NIFEdipine 60 MG 24 hr tablet Commonly known as: ADALAT CC Take 1 tablet (60 mg total) by mouth daily. Start taking on: July 11, 2023 What changed:  medication strength how much to take when to take this   oxyCODONE 5 MG immediate release tablet Commonly known as: Roxicodone Take 1 tablet (5 mg total) by mouth every 4 (four) hours as needed for up to 15 doses for severe pain or breakthrough pain.   VITRON-C PO Take 1 tablet by mouth daily.               Durable Medical Equipment  (From admission, onward)           Start     Ordered   07/09/23 0700  For home use only DME Walker rolling  Once       Question Answer Comment  Walker: With 5 Inch Wheels   Patient needs a walker to treat with the following condition Balance disorder      07/09/23 0659   07/09/23 0700  For home use only DME 3 n 1  Once        07/09/23 0659   07/08/23 0914  For home use only DME Walker rolling  Once        Question Answer Comment  Walker: With 5 Inch Wheels   Patient needs a walker to treat with the following condition Weakness of both legs      07/08/23 0913           Discharge home in stable condition Infant Feeding: Bottle Infant Disposition:home with mother Discharge instruction: per After Visit Summary and Postpartum booklet. Activity: Advance as tolerated. Pelvic rest for 6 weeks.  Diet: carb modified diet, low salt diet, and iron rich diet Future Appointments:No future appointments. Follow up Visit:  Follow-up Information     Ob/Gyn, [place]. Schedule an appointment as soon as possible for a visit in 1 week(s).   Specialty: Obstetrics and Gynecology Why: 1 week for incision and mood check. 6 weeks for postpartum check. Contact information: [address] [place] [address] [phone]         Andria Meuse, MD. Go on 07/24/2023.   Specialties: General Surgery, Colon and Rectal Surgery Why: 10 AM, please arrive 30 min prior to appointment time to check in. Contact information: [address] [place] [address] [phone]                Anticipated Birth Control:  BTL done PP.   07/10/2023 Prescilla Sours, MD

## 2023-07-10 NOTE — Plan of Care (Signed)
  Problem: Health Behavior/Discharge Planning: Goal: Ability to manage health-related needs will improve Outcome: Completed/Met   Problem: Clinical Measurements: Goal: Ability to maintain clinical measurements within normal limits will improve Outcome: Completed/Met Goal: Will remain free from infection Outcome: Completed/Met Goal: Diagnostic test results will improve Outcome: Completed/Met Goal: Respiratory complications will improve Outcome: Completed/Met Goal: Cardiovascular complication will be avoided Outcome: Completed/Met   Problem: Elimination: Goal: Will not experience complications related to bowel motility Outcome: Completed/Met Goal: Will not experience complications related to urinary retention Outcome: Completed/Met   Problem: Pain Managment: Goal: General experience of comfort will improve Outcome: Completed/Met   Problem: Skin Integrity: Goal: Risk for impaired skin integrity will decrease Outcome: Completed/Met   Problem: Education: Goal: Knowledge of the prescribed therapeutic regimen will improve Outcome: Completed/Met Goal: Understanding of sexual limitations or changes related to disease process or condition will improve Outcome: Completed/Met Goal: Individualized Educational Video(s) Outcome: Completed/Met   Problem: Education: Goal: Knowledge of condition will improve Outcome: Completed/Met Goal: Individualized Educational Video(s) Outcome: Completed/Met   Problem: Education: Goal: Ability to describe self-care measures that may prevent or decrease complications (Diabetes Survival Skills Education) will improve Outcome: Completed/Met Goal: Individualized Educational Video(s) Outcome: Completed/Met   Problem: Coping: Goal: Ability to adjust to condition or change in health will improve Outcome: Completed/Met   Problem: Fluid Volume: Goal: Ability to maintain a balanced intake and output will improve Outcome: Completed/Met   Problem:  Health Behavior/Discharge Planning: Goal: Ability to identify and utilize available resources and services will improve Outcome: Completed/Met Goal: Ability to manage health-related needs will improve Outcome: Completed/Met   Problem: Metabolic: Goal: Ability to maintain appropriate glucose levels will improve Outcome: Completed/Met   Problem: Nutritional: Goal: Maintenance of adequate nutrition will improve Outcome: Completed/Met Goal: Progress toward achieving an optimal weight will improve Outcome: Completed/Met   Problem: Skin Integrity: Goal: Risk for impaired skin integrity will decrease Outcome: Completed/Met   Problem: Tissue Perfusion: Goal: Adequacy of tissue perfusion will improve Outcome: Completed/Met   Problem: Fluid Volume: Goal: Hemodynamic stability will improve Outcome: Completed/Met   Problem: Clinical Measurements: Goal: Diagnostic test results will improve Outcome: Completed/Met Goal: Signs and symptoms of infection will decrease Outcome: Completed/Met   Problem: Respiratory: Goal: Ability to maintain adequate ventilation will improve Outcome: Completed/Met   Problem: Education: Goal: Knowledge of disease or condition will improve Outcome: Completed/Met Goal: Knowledge of the prescribed therapeutic regimen will improve Outcome: Completed/Met   Problem: Fluid Volume: Goal: Peripheral tissue perfusion will improve Outcome: Completed/Met   Problem: Clinical Measurements: Goal: Complications related to disease process, condition or treatment will be avoided or minimized Outcome: Completed/Met

## 2023-07-24 ENCOUNTER — Telehealth (HOSPITAL_COMMUNITY): Payer: Self-pay | Admitting: *Deleted

## 2023-07-24 NOTE — Telephone Encounter (Signed)
Attempted hospital discharge follow-up call. Left message for patient to return RN call with any questions or concerns. Deforest Hoyles, RN, 07/24/23, [phone]

## 2023-09-28 ENCOUNTER — Emergency Department (HOSPITAL_COMMUNITY)
Admission: EM | Admit: 2023-09-28 | Discharge: 2023-09-28 | Disposition: A | Discharge status: Home / Self Care | Payer: Medicaid Other | Attending: Emergency Medicine | Admitting: Emergency Medicine

## 2023-09-28 ENCOUNTER — Other Ambulatory Visit: Payer: Self-pay

## 2023-09-28 ENCOUNTER — Emergency Department (HOSPITAL_COMMUNITY): Payer: Medicaid Other

## 2023-09-28 DIAGNOSIS — L03211 Cellulitis of face: Secondary | ICD-10-CM | POA: Diagnosis not present

## 2023-09-28 DIAGNOSIS — R220 Localized swelling, mass and lump, head: Secondary | ICD-10-CM | POA: Diagnosis present

## 2023-09-28 LAB — BASIC METABOLIC PANEL
Anion gap: 8 (ref 5–15)
BUN: 8 mg/dL (ref 6–20)
CO2: 25 mmol/L (ref 22–32)
Calcium: 9.2 mg/dL (ref 8.9–10.3)
Chloride: 104 mmol/L (ref 98–111)
Creatinine, Ser: 0.68 mg/dL (ref 0.44–1.00)
GFR, Estimated: >60 mL/min (ref >60)
Glucose, Bld: 116 mg/dL — ABNORMAL HIGH (ref 70–99)
Potassium: 3.5 mmol/L (ref 3.5–5.1)
Sodium: 137 mmol/L (ref 135–145)

## 2023-09-28 LAB — CBC
HCT: 35.8 % — ABNORMAL LOW (ref 36.0–46.0)
Hemoglobin: 11.9 g/dL — ABNORMAL LOW (ref 12.0–15.0)
MCH: 27 pg (ref 26.0–34.0)
MCHC: 33.2 g/dL (ref 30.0–36.0)
MCV: 81.4 fL (ref 80.0–100.0)
Platelets: 257 10*3/uL (ref 150–400)
RBC: 4.4 MIL/uL (ref 3.87–5.11)
RDW: 15.9 % — ABNORMAL HIGH (ref 11.5–15.5)
WBC: 8.3 10*3/uL (ref 4.0–10.5)
nRBC: 0 % (ref 0.0–0.2)

## 2023-09-28 LAB — HCG, SERUM, QUALITATIVE: Preg, Serum: NEGATIVE

## 2023-09-28 MED ORDER — IOHEXOL 350 MG/ML SOLN
75.0000 mL | Freq: Once | INTRAVENOUS | Status: AC | PRN
  Administered 2023-09-28: 75 mL via INTRAVENOUS

## 2023-09-28 MED ORDER — CEPHALEXIN 500 MG PO CAPS: 500.0000 mg | ORAL_CAPSULE | Freq: Four times a day (QID) | ORAL | 0 refills | Status: AC

## 2023-09-28 NOTE — ED Provider Notes (Signed)
[facility] EMERGENCY DEPARTMENT AT [facility] Provider Note   CSN: [id number] Arrival date & time: 09/28/23  0156     History  Chief Complaint  Patient presents with   Left facial Swelling/ Lip Swelling    Erika Lewis is a 37 y.o. female.  HPI   37 year old female presenting to the emergency department with left lip swelling and pain.  The patient recently gave birth via C-section and she has been breast-feeding at home.  She states that over the last 3 days she has had worsening left lip swelling that started with a pimple and is now spread to worsening facial swelling.  She had some drainage of pus but it has stopped draining and is now painful.  No fevers or chills, no difficulty breathing. She is tolerating PO.  Home Medications Prior to Admission medications   Medication Sig Start Date End Date Taking? Authorizing Provider  cephALEXin (KEFLEX) 500 MG capsule Take 1 capsule (500 mg total) by mouth 4 (four) times daily. 09/28/23  Yes Ernie Avena, MD  acetaminophen (TYLENOL) 500 MG tablet Take 1,000 mg by mouth every 6 (six) hours as needed for moderate pain.    [provider]  albuterol (PROVENTIL HFA;VENTOLIN HFA) 108 (90 Base) MCG/ACT inhaler Inhale 1-2 puffs into the lungs every 6 (six) hours as needed for wheezing or shortness of breath. 09/09/17   Harris, Abigail, PA-C  fluticasone (FLOVENT HFA) 110 MCG/ACT inhaler Inhale 2 puffs into the lungs daily.    [provider]  guaiFENesin (MUCINEX) 600 MG 12 hr tablet Take 2 tablets (1,200 mg total) by mouth 2 (two) times daily as needed for up to 14 doses. 07/10/23   Hoover Browns, MD  guaiFENesin (ROBITUSSIN) 100 MG/5ML liquid Take 10 mLs by mouth every 4 (four) hours as needed for up to 42 doses for cough or to loosen phlegm. 07/10/23   Hoover Browns, MD  ibuprofen (ADVIL) 600 MG tablet Take 1 tablet (600 mg total) by mouth every 6 (six) hours as needed for mild pain, moderate pain or cramping.  07/10/23   Hoover Browns, MD  insulin aspart (NOVOLOG) 100 UNIT/ML injection Inject 0-15 Units into the skin every 4 (four) hours. 07/10/23   Hoover Browns, MD  Iron-Vitamin C (VITRON-C PO) Take 1 tablet by mouth daily.    [provider]  NIFEdipine (ADALAT CC) 60 MG 24 hr tablet Take 1 tablet (60 mg total) by mouth daily. 07/11/23   Hoover Browns, MD  oxyCODONE (ROXICODONE) 5 MG immediate release tablet Take 1 tablet (5 mg total) by mouth every 4 (four) hours as needed for up to 15 doses for severe pain or breakthrough pain. 07/10/23   Hoover Browns, MD  Prenatal Vit-Fe Fumarate-FA (MULTIVITAMIN-PRENATAL) 27-0.8 MG TABS tablet Take 1 tablet by mouth daily.    [provider]      Allergies    Patient has no known allergies.    Review of Systems   Review of Systems  All other systems reviewed and are negative.   Physical Exam Updated Vital Signs BP (!) 174/83 (BP Location: Left Arm)   Pulse 84   Temp 98.4 F (36.9 C) (Oral)   Resp 18   SpO2 99%  Physical Exam Vitals and nursing note reviewed.  Constitutional:      General: She is not in acute distress. HENT:     Head: Normocephalic and atraumatic.     Comments: Mild trismus on the present, left upper lip pimple/lesion with  surrounding swelling and erythema, no oropharyngeal swelling Eyes:     Conjunctiva/sclera: Conjunctivae normal.     Pupils: Pupils are equal, round, and reactive to light.  Cardiovascular:     Rate and Rhythm: Normal rate and regular rhythm.  Pulmonary:     Effort: Pulmonary effort is normal. No respiratory distress.  Abdominal:     General: There is no distension.     Tenderness: There is no guarding.  Musculoskeletal:        General: No deformity or signs of injury.     Cervical back: Neck supple.  Skin:    Findings: No lesion or rash.  Neurological:     General: No focal deficit present.     Mental Status: She is alert. Mental status is at baseline.     ED Results / Procedures / Treatments    Labs (all labs ordered are listed, but only abnormal results are displayed) Labs Reviewed  CBC - Abnormal; Notable for the following components:      Result Value   Hemoglobin 11.9 (*)    HCT 35.8 (*)    RDW 15.9 (*)    All other components within normal limits  BASIC METABOLIC PANEL - Abnormal; Notable for the following components:   Glucose, Bld 116 (*)    All other components within normal limits  HCG, SERUM, QUALITATIVE    EKG None  Radiology CT Maxillofacial W Contrast Result Date: 09/28/2023 CLINICAL DATA:  Facial swelling, left earache EXAM: CT MAXILLOFACIAL WITH CONTRAST TECHNIQUE: Multidetector CT imaging of the maxillofacial structures was performed with intravenous contrast. Multiplanar CT image reconstructions were also generated. RADIATION DOSE REDUCTION: This exam was performed according to the departmental dose-optimization program which includes automated exposure control, adjustment of the mA and/or kV according to patient size and/or use of iterative reconstruction technique. CONTRAST:  75mL OMNIPAQUE IOHEXOL 350 MG/ML SOLN COMPARISON:  None Available. FINDINGS: Osseous: No fracture or mandibular dislocation. No destructive process. Orbits: Negative. No traumatic or inflammatory finding. Sinuses: Clear. Soft tissues: Asymmetric thickening and hyperenhancement of the left upper lip and surrounding soft tissues. No focal low-density collection to suggest abscess. Debris in the bilateral external auditory canals, likely cerumen. Limited intracranial: No significant or unexpected finding. IMPRESSION: Asymmetric thickening and hyperenhancement of the left upper lip and surrounding soft tissues, which could represent cellulitis. No focal low-density collection to suggest abscess. Electronically Signed   By: Wiliam Ke M.D.   On: 09/28/2023 03:56    Procedures Procedures    Medications Ordered in ED Medications  iohexol (OMNIPAQUE) 350 MG/ML injection 75 mL (75 mLs  Intravenous Contrast Given 09/28/23 0343)    ED Course/ Medical Decision Making/ A&P                                 Medical Decision Making Amount and/or Complexity of Data Reviewed Labs: ordered.  Risk Prescription drug management.     37 year old female presenting to the emergency department with left lip swelling and pain.  The patient recently gave birth via C-section and she has been breast-feeding at home.  She states that over the last 3 days she has had worsening left lip swelling that started with a pimple and is now spread to worsening facial swelling.  She had some drainage of pus but it has stopped draining and is now painful.  No fevers or chills, no difficulty breathing. She is tolerating PO.  On arrival,  the patient was vitally stable, afebrile, not tachycardic, hemodynamically stable.  Physical exam revealed evidence of a facial swelling or cellulitis.  Considered abscess on the differential.  Low concern for Ludwig's Angina.  Laboratory evaluation revealed hCG negative, BMP unremarkable, CBC without a leukocytosis, mild anemia to 11.9.  CT max face: IMPRESSION:  Asymmetric thickening and hyperenhancement of the left upper lip and  surrounding soft tissues, which could represent cellulitis. No focal  low-density collection to suggest abscess.    Patient is tolerating p.o., not septic, no evidence of abscess.  Evidence of facial cellulitis based on CT and physical exam.  Will discharge with a course of Keflex which should be safe while the patient is breast-feeding.  Patient was advised to take the medicine, return precautions provided in the event of any severe worsening symptoms.  Tylenol and ibuprofen for pain control.  Stable for discharge.  Final Clinical Impression(s) / ED Diagnoses Final diagnoses:  Facial cellulitis    Rx / DC Orders ED Discharge Orders          Ordered    cephALEXin (KEFLEX) 500 MG capsule  4 times daily        09/28/23 0857               Ernie Avena, MD 09/28/23 0900

## 2023-09-28 NOTE — ED Provider Triage Note (Signed)
Emergency Medicine Provider Triage Evaluation Note  MARIGOLD GRULLON , a 37 y.o. female  was evaluated in triage.  Pt complains of facial swelling x 3 days. Recently gave birth via C-section, went back to work tonight and started having more swelling of her left upper lip. Has had some drainage of pus but it stopped draining and is very painful. Didn't notice a pimple or ingrown hair but states it started as a "large knot".   Review of Systems  Positive: Lip swelling Negative: Fever, chills, dysphagia, difficulty breathing  Physical Exam  BP (!) 150/98 (BP Location: Right Arm)   Pulse 84   Temp 98.2 F (36.8 C) (Oral)   Resp 20   SpO2 100%  Gen:   Awake, no distress   Resp:  Normal effort  MSK:   Moves extremities without difficulty  Other:    Medical Decision Making  Medically screening exam initiated at 2:16 AM.  Appropriate orders placed.  NORALYN ANDERBERG was informed that the remainder of the evaluation will be completed by another provider, this initial triage assessment does not replace that evaluation, and the importance of remaining in the ED until their evaluation is complete.  Workup initiated   Heavyn Yearsley T, PA-C 09/28/23 0216

## 2023-09-28 NOTE — ED Notes (Signed)
EDP at [facility] 

## 2023-09-28 NOTE — ED Triage Notes (Signed)
Patient reports increasing swelling at left upper lip and left facial swelling for 3 days , denies injury , she adds left ear ache . No fever or chills.

## 2023-09-28 NOTE — Discharge Instructions (Addendum)
Your CT scan did not show evidence of an abscess in your face, you do have evidence of facial cellulitis.  It is ok to continue to breast feed while taking your prescribed antibiotic and recommend Tylenol and ibuprofen for pain control.

## 2023-09-28 NOTE — ED Notes (Addendum)
C/o redness, swelling, TTP localized to L upper lip, no adjacent redness or rash, denies fever, syncope, dizziness, NVD, sob or other sx.
